# Patient Record
Sex: Male | Born: 1985 | Race: Black or African American | Smoking: Current every day smoker
Health system: Northeastern US, Academic
[De-identification: ages and names within clinical notes are randomized; demographics above are authoritative.]

## PROBLEM LIST (undated history)

## (undated) DIAGNOSIS — R7303 Prediabetes: Secondary | ICD-10-CM

## (undated) DIAGNOSIS — F32A Depression, unspecified: Secondary | ICD-10-CM

## (undated) DIAGNOSIS — J45909 Unspecified asthma, uncomplicated: Secondary | ICD-10-CM

## (undated) DIAGNOSIS — T7840XA Allergy, unspecified, initial encounter: Secondary | ICD-10-CM

## (undated) DIAGNOSIS — F419 Anxiety disorder, unspecified: Secondary | ICD-10-CM

## (undated) HISTORY — DX: Depression, unspecified: F32.A

## (undated) HISTORY — DX: Prediabetes: R73.03

## (undated) HISTORY — DX: Allergy, unspecified, initial encounter: T78.40XA

## (undated) HISTORY — DX: Unspecified asthma, uncomplicated: J45.909

## (undated) HISTORY — DX: Anxiety disorder, unspecified: F41.9

---
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
## 2005-07-28 DIAGNOSIS — J45909 Unspecified asthma, uncomplicated: Secondary | ICD-10-CM | POA: Insufficient documentation

## 2015-09-30 ENCOUNTER — Emergency Department (HOSPITAL_COMMUNITY)
Admission: EM | Admit: 2015-09-30 | Discharge: 2015-09-30 | Disposition: A | Discharge status: Home / Self Care | Payer: Self-pay | Attending: Emergency Medicine | Admitting: Emergency Medicine

## 2015-09-30 ENCOUNTER — Encounter (HOSPITAL_COMMUNITY): Payer: Self-pay | Admitting: Nurse Practitioner

## 2015-09-30 DIAGNOSIS — K0889 Other specified disorders of teeth and supporting structures: Secondary | ICD-10-CM | POA: Insufficient documentation

## 2015-09-30 DIAGNOSIS — F17210 Nicotine dependence, cigarettes, uncomplicated: Secondary | ICD-10-CM | POA: Insufficient documentation

## 2015-09-30 MED ORDER — PENICILLIN V POTASSIUM 500 MG PO TABS: 500.0000 mg | ORAL_TABLET | Freq: Four times a day (QID) | ORAL | Status: DC

## 2015-09-30 NOTE — Discharge Instructions (Signed)

## 2015-09-30 NOTE — ED Provider Notes (Signed)
CSN: [id number]     Arrival date & time 09/30/15  1839 History  By signing my name below, I, Soijett Blue, attest that this documentation has been prepared under the direction and in the presence of Roxy Horseman, PA-C Electronically Signed: Soijett Blue, ED Scribe. 09/30/2015. 7:04 PM.   Chief Complaint  Patient presents with  . Dental Pain      The history is provided by the patient. No language interpreter was used.    Corey Alexander is a 29 y.o. male who presents to the Emergency Department complaining of moderate left upper dental pain onset yesterday. He notes that he hasn't been to see a dentist due to him not having insurance to see one. He denies broken teeth or any injuries/trauma to his mouth. He states that he is having associated symptoms of facial swelling x last night. He states that has tried ibuprofen with mild relief for his symptoms. He denies gum swelling/bleeding, sore throat, fever, chills, and any other symptoms. Pt denies allergies to any medications.   History reviewed. No pertinent past medical history. History reviewed. No pertinent past surgical history. History reviewed. No pertinent family history. Social History  Substance Use Topics  . Smoking status: Current Every Day Smoker    Types: Cigarettes  . Smokeless tobacco: None  . Alcohol Use: Yes    Review of Systems  Constitutional: Negative for fever.  HENT: Positive for dental problem and facial swelling. Negative for sore throat and trouble swallowing.   Skin: Negative for color change and wound.      Allergies  Review of patient's allergies indicates no known allergies.  Home Medications   Prior to Admission medications   Not on File   BP 125/82 mmHg  Pulse 98  Temp(Src) 98.2 F (36.8 C) (Oral)  Resp 17  SpO2 98% Physical Exam  Constitutional: He is oriented to person, place, and time. He appears well-developed and well-nourished. No distress.  HENT:  Head: Normocephalic and  atraumatic.  Mouth/Throat:    Poor dentition throughout.  Affected tooth as diagrammed.  No signs of peritonsillar or tonsillar abscess.  No signs of gingival abscess. Oropharynx is clear and without exudates.  Uvula is midline.  Airway is intact. No signs of Ludwig's angina with palpation of oral and sublingual mucosa.   Eyes: EOM are normal.  Neck: Neck supple.  Cardiovascular: Normal rate.   Pulmonary/Chest: Effort normal. No respiratory distress.  Musculoskeletal: Normal range of motion.  Neurological: He is alert and oriented to person, place, and time.  Skin: Skin is warm and dry.  Psychiatric: He has a normal mood and affect. His behavior is normal.  Nursing note and vitals reviewed.   ED Course  Procedures (including critical care time) DIAGNOSTIC STUDIES: Oxygen Saturation is 98% on RA, nl by my interpretation.    COORDINATION OF CARE: 7:04 PM Discussed treatment plan with pt at bedside which includes abx Rx, referral and f/u with dentist and pt agreed to plan.    MDM   Final diagnoses:  Pain, dental    Patient with toothache.  No gross abscess.  Exam unconcerning for Ludwig's angina or spread of infection.  Will treat with penicillin and OTC pain medicine.  Urged patient to follow-up with dentist.     I personally performed the services described in this documentation, which was scribed in my presence. The recorded information has been reviewed and is accurate.      Roxy Horseman, PA-C 09/30/15 1911  Mancel Bale,  MD 09/30/15 2338

## 2015-09-30 NOTE — ED Notes (Signed)
He c/o L sided dental pain onset yesterday, today began to have facial swelling. He tried ibuprofen with no relief. He does not have insurance to see a Education officer, communitydentist

## 2017-04-23 ENCOUNTER — Emergency Department (HOSPITAL_COMMUNITY)
Admission: EM | Admit: 2017-04-23 | Discharge: 2017-04-23 | Disposition: A | Discharge status: Home / Self Care | Payer: Self-pay | Attending: Emergency Medicine | Admitting: Emergency Medicine

## 2017-04-23 ENCOUNTER — Encounter (HOSPITAL_COMMUNITY): Payer: Self-pay | Admitting: Emergency Medicine

## 2017-04-23 DIAGNOSIS — Z79899 Other long term (current) drug therapy: Secondary | ICD-10-CM | POA: Insufficient documentation

## 2017-04-23 DIAGNOSIS — J45909 Unspecified asthma, uncomplicated: Secondary | ICD-10-CM | POA: Insufficient documentation

## 2017-04-23 DIAGNOSIS — J029 Acute pharyngitis, unspecified: Secondary | ICD-10-CM | POA: Insufficient documentation

## 2017-04-23 DIAGNOSIS — F17210 Nicotine dependence, cigarettes, uncomplicated: Secondary | ICD-10-CM | POA: Insufficient documentation

## 2017-04-23 HISTORY — DX: Unspecified asthma, uncomplicated: J45.909

## 2017-04-23 LAB — RAPID STREP SCREEN (MED CTR MEBANE ONLY): STREPTOCOCCUS, GROUP A SCREEN (DIRECT): NEGATIVE

## 2017-04-23 MED ORDER — IBUPROFEN 200 MG PO TABS
600.0000 mg | ORAL_TABLET | Freq: Once | ORAL | Status: AC
  Administered 2017-04-23: 600 mg via ORAL
  Filled 2017-04-23: qty 3

## 2017-04-23 MED ORDER — HYDROCODONE-ACETAMINOPHEN 7.5-325 MG/15ML PO SOLN: 15.0000 mL | Freq: Three times a day (TID) | ORAL | 0 refills | Status: DC | PRN

## 2017-04-23 MED ORDER — PENICILLIN G BENZATHINE 1200000 UNIT/2ML IM SUSP
1.2000 10*6.[IU] | Freq: Once | INTRAMUSCULAR | Status: AC
  Administered 2017-04-23: 1.2 10*6.[IU] via INTRAMUSCULAR
  Filled 2017-04-23: qty 2

## 2017-04-23 MED ORDER — DEXAMETHASONE SODIUM PHOSPHATE 10 MG/ML IJ SOLN
10.0000 mg | Freq: Once | INTRAMUSCULAR | Status: AC
  Administered 2017-04-23: 10 mg via INTRAMUSCULAR
  Filled 2017-04-23: qty 1

## 2017-04-23 NOTE — Discharge Instructions (Signed)
Take your medication as prescribed as needed for pain relief. I also recommend continuing to take Tylenol and ibuprofen as prescribed over-the-counter for additional relief. Continue drinking fluids at home to remain hydrated. You will remain contagious for the first 24 hours after receiving your dose of antibiotics in the ED. Follow-up with a primary care provider from the clinic listed below in the next 3-4 days if your symptoms have not improved. Please return to the Emergency Department if symptoms worsen or new onset of fever, facial swelling, unable to open her jaw completely, drooling due to being unable to swallow, difficulty breathing, vomiting.

## 2017-04-23 NOTE — ED Triage Notes (Signed)
Patient c/o sore throat, painful to swallow for past few days. Pt states waking up this morning with more swelling noted to throat.

## 2017-04-23 NOTE — ED Provider Notes (Signed)
[facility]-EMERGENCY DEPT Provider Note   CSN: [id number] Arrival date & time: 04/23/17  1732  By signing my name below, I, Diona Browner, attest that this documentation has been prepared under the direction and in the presence of Melburn Hake, PA-C. Electronically Signed: Diona Browner, ED Scribe. 04/23/17. 6:52 PM.  History   Chief Complaint Chief Complaint  Patient presents with  . Sore Throat    HPI Corey Alexander is a 31 y.o. male with a PMHx of asthma who presents to the Emergency Department complaining of a gradually worsening, sore throat for the last couple of days. Pt notes increased swelling to his throat this morning. Associated sx include neck swelling, rhinorrhea, and hurts to swallow. He drank hot tea with no relief. He hasn't taken any medication for relief. No one around him has been sick. Pt denies fever, cough, facial swelling, trismus, drooling, voice change, SOB or vomiting.  The history is provided by the patient. No language interpreter was used.    Past Medical History:  Diagnosis Date  . Asthma     There are no active problems to display for this patient.   History reviewed. No pertinent surgical history.     Home Medications    Prior to Admission medications   Medication Sig Start Date End Date Taking? Authorizing Provider  HYDROcodone-acetaminophen (HYCET) 7.5-325 mg/15 ml solution Take 15 mLs by mouth every 8 (eight) hours as needed for moderate pain. 04/23/17   Barrett Henle, PA-C  penicillin v potassium (VEETID) 500 MG tablet Take 1 tablet (500 mg total) by mouth 4 (four) times daily. 09/30/15   Roxy Horseman, PA-C    Family History No family history on file.  Social History Social History  Substance Use Topics  . Smoking status: Current Every Day Smoker    Types: Cigarettes  . Smokeless tobacco: Not on file  . Alcohol use Yes     Allergies   Patient has no known allergies.   Review of Systems Review of Systems    Constitutional: Negative for fever.  HENT: Positive for rhinorrhea, sore throat and trouble swallowing.   Respiratory: Negative for cough.   Gastrointestinal: Negative for vomiting.     Physical Exam Updated Vital Signs BP (!) 132/96 (BP Location: Right Arm)   Pulse 72   Temp 98.3 F (36.8 C) (Oral)   Resp 16   Ht 5\' 11"  (1.803 m)   Wt 88.5 kg (195 lb)   SpO2 100%   BMI 27.20 kg/m   Physical Exam  Constitutional: He is oriented to person, place, and time. He appears well-developed and well-nourished.  HENT:  Head: Normocephalic and atraumatic.  Mouth/Throat: Uvula is midline, oropharynx is clear and moist and mucous membranes are normal. No oropharyngeal exudate, posterior oropharyngeal edema, posterior oropharyngeal erythema or tonsillar abscesses. Tonsils are 3+ on the right. Tonsils are 3+ on the left. Tonsillar exudate.  Moderate swelling and erythema present to bilateral tonsils and posterior oral pharynx. Small amount of exudate to bilateral tonsils. Uvula midline. No muffled voice. No trismus or drooling. No facial or neck swelling.   Eyes: Conjunctivae and EOM are normal. Right eye exhibits no discharge. Left eye exhibits no discharge. No scleral icterus.  Neck: Normal range of motion. Neck supple.  Cardiovascular: Normal rate, regular rhythm, normal heart sounds and intact distal pulses.   Pulmonary/Chest: Effort normal and breath sounds normal. No respiratory distress. He has no wheezes. He has no rales. He exhibits no tenderness.  Abdominal: Soft. He  exhibits no distension.  Musculoskeletal: Normal range of motion. He exhibits no edema.  Lymphadenopathy:    He has cervical adenopathy (bilateral submandibular ).  Neurological: He is alert and oriented to person, place, and time.  Skin: Skin is warm and dry.  Nursing note and vitals reviewed.    ED Treatments / Results  DIAGNOSTIC STUDIES: Oxygen Saturation is 100% on RA, normal by my interpretation.    COORDINATION OF CARE: 5:58 PM-Discussed next steps with pt. Pt verbalized understanding and is agreeable with the plan.   Labs (all labs ordered are listed, but only abnormal results are displayed) Labs Reviewed  RAPID STREP SCREEN (NOT [facility])  CULTURE, GROUP A STREP [facility])    EKG  EKG Interpretation None       Radiology No results found.  Procedures Procedures (including critical care time)  Medications Ordered in ED Medications  penicillin g benzathine (BICILLIN LA) 1200000 UNIT/2ML injection 1.2 Million Units (not administered)  dexamethasone (DECADRON) injection 10 mg (10 mg Intramuscular Given 04/23/17 1813)  ibuprofen (ADVIL,MOTRIN) tablet 600 mg (600 mg Oral Given 04/23/17 1813)     Initial Impression / Assessment and Plan / ED Course  I have reviewed the triage vital signs and the nursing notes.  Pertinent labs & imaging results that were available during my care of the patient were reviewed by me and considered in my medical decision making (see chart for details).     Patient presents with sore throat that started this morning and has worsened throughout the day. Denies associated symptoms or any known sick contacts. VSS. Exam revealed moderate swelling and erythema to bilateral tonsils and posterior oropharynx. No trismus or drooling. No facial swelling. Uvula midline. Patient tolerating secretions. Remaining exam unremarkable. No evidence suggesting peritonsillar abscess at this time. Centor score 3. Patient given IM steroids and by mouth ibuprofen in the ED. Tolerating PO. Rapid strep negative, however due to pt sxs and exam concerning for strep pharyngitis, plan to treat patient with single IM injection of penicillin. Discussed results and plan for discharge with patient. Advised patient to follow up with PCP as needed. Discussed return precautions.  Final Clinical Impressions(s) / ED Diagnoses   Final diagnoses:  Pharyngitis, unspecified etiology     New Prescriptions New Prescriptions   HYDROCODONE-ACETAMINOPHEN (HYCET) 7.5-325 MG/15 ML SOLUTION    Take 15 mLs by mouth every 8 (eight) hours as needed for moderate pain.   I personally performed the services described in this documentation, which was scribed in my presence. The recorded information has been reviewed and is accurate.     Barrett Henleadeau, Shmuel Girgis Elizabeth, PA-C 04/23/17 Herbie [place]1855    Mancel BaleWentz, Elliott, MD 04/23/17 [phone]

## 2017-04-26 LAB — CULTURE, GROUP A STREP (THRC)

## 2017-11-28 ENCOUNTER — Emergency Department (HOSPITAL_COMMUNITY)
Admission: EM | Admit: 2017-11-28 | Discharge: 2017-11-28 | Disposition: A | Discharge status: Home / Self Care | Payer: PRIVATE HEALTH INSURANCE | Attending: Emergency Medicine | Admitting: Emergency Medicine

## 2017-11-28 ENCOUNTER — Other Ambulatory Visit: Payer: Self-pay

## 2017-11-28 ENCOUNTER — Encounter (HOSPITAL_COMMUNITY): Payer: Self-pay

## 2017-11-28 DIAGNOSIS — K0889 Other specified disorders of teeth and supporting structures: Secondary | ICD-10-CM | POA: Diagnosis present

## 2017-11-28 DIAGNOSIS — K047 Periapical abscess without sinus: Secondary | ICD-10-CM

## 2017-11-28 DIAGNOSIS — F17210 Nicotine dependence, cigarettes, uncomplicated: Secondary | ICD-10-CM | POA: Diagnosis not present

## 2017-11-28 DIAGNOSIS — Z79899 Other long term (current) drug therapy: Secondary | ICD-10-CM | POA: Insufficient documentation

## 2017-11-28 DIAGNOSIS — J45909 Unspecified asthma, uncomplicated: Secondary | ICD-10-CM | POA: Insufficient documentation

## 2017-11-28 DIAGNOSIS — K029 Dental caries, unspecified: Secondary | ICD-10-CM | POA: Diagnosis not present

## 2017-11-28 MED ORDER — NAPROXEN 500 MG PO TABS: 500.0000 mg | ORAL_TABLET | Freq: Two times a day (BID) | ORAL | 0 refills | Status: DC

## 2017-11-28 MED ORDER — PENICILLIN V POTASSIUM 500 MG PO TABS: 500.0000 mg | ORAL_TABLET | Freq: Four times a day (QID) | ORAL | 0 refills | Status: AC

## 2017-11-28 NOTE — ED Provider Notes (Signed)
[name] Provider Note   CSN: [id number] Arrival date & time: 11/28/17  1458     History   Chief Complaint Chief Complaint  Patient presents with  . Dental Pain    HPI Corey Alexander is a 32 y.o. male who presents to the ED with dental pain. The pain started last night after a filing fell out of the left upper dental area. Pain increased today with swelling of the left side of the face. Patient has taken nothing for pain.   HPI  Past Medical History:  Diagnosis Date  . Asthma     There are no active problems to display for this patient.   History reviewed. No pertinent surgical history.     Home Medications    Prior to Admission medications   Medication Sig Start Date End Date Taking? Authorizing Provider  HYDROcodone-acetaminophen (HYCET) 7.5-325 mg/15 ml solution Take 15 mLs by mouth every 8 (eight) hours as needed for moderate pain. 04/23/17   Barrett HenleNadeau, Nicole Elizabeth, PA-C  naproxen (NAPROSYN) 500 MG tablet Take 1 tablet (500 mg total) by mouth 2 (two) times daily. 11/28/17   Janne NapoleonNeese, Hope M, NP  penicillin v potassium (VEETID) 500 MG tablet Take 1 tablet (500 mg total) by mouth 4 (four) times daily for 7 days. 11/28/17 12/05/17  Janne NapoleonNeese, Hope M, NP    Family History History reviewed. No pertinent family history.  Social History Social History   Tobacco Use  . Smoking status: Current Every Day Smoker    Types: Cigarettes  . Smokeless tobacco: Never Used  Substance Use Topics  . Alcohol use: Yes    Comment: social  . Drug use: No     Allergies   Patient has no known allergies.   Review of Systems Review of Systems  HENT: Positive for dental problem and facial swelling.   All other systems reviewed and are negative.    Physical Exam Updated Vital Signs BP 133/84   Pulse 97   Temp 98.2 F (36.8 C)   Resp 16   SpO2 100%   Physical Exam  Constitutional: He appears well-developed and well-nourished.  No distress.  HENT:  Mouth/Throat: Uvula is midline.    The filing is out of the left upper first molar. There is swelling and tenderness to the gum surrounding the tooth.   Eyes: EOM are normal.  Neck: Normal range of motion. Neck supple.  Cardiovascular: Normal rate and regular rhythm.  Pulmonary/Chest: Effort normal and breath sounds normal.  Musculoskeletal: Normal range of motion.  Lymphadenopathy:    He has no cervical adenopathy.  Neurological: He is alert.  Skin: Skin is warm and dry.  Psychiatric: He has a normal mood and affect.  Nursing note and vitals reviewed.    ED Treatments / Results  Labs (all labs ordered are listed, but only abnormal results are displayed) Labs Reviewed - No data to display  Radiology No results found.  Procedures Procedures (including critical care time)  Medications Ordered in ED Medications - No data to display   Initial Impression / Assessment and Plan / ED Course  I have reviewed the triage vital signs and nurses notes.  Patient with toothache.  No gross abscess.  Exam unconcerning for Ludwig's angina or spread of infection.  Will treat with penicillin and anti-inflammatories medicine.  Urged patient to follow-up with dentist.    Final Clinical Impressions(s) / ED Diagnoses   Final diagnoses:  Infected dental caries  ED Discharge Orders        Ordered    penicillin v potassium (VEETID) 500 MG tablet  4 times daily     11/28/17 1626    naproxen (NAPROSYN) 500 MG tablet  2 times daily     11/28/17 [address] [place], [place] 11/28/17 1631    Bethann Berkshire, MD 11/28/17 2342

## 2017-11-28 NOTE — ED Triage Notes (Signed)
Pt states he has two cracked teeth after a filling came out. He reports he has trouble eating due to pain. No facial swelling noted.

## 2018-01-27 ENCOUNTER — Emergency Department (HOSPITAL_COMMUNITY): Payer: No Typology Code available for payment source

## 2018-01-27 ENCOUNTER — Emergency Department (HOSPITAL_COMMUNITY)
Admission: EM | Admit: 2018-01-27 | Discharge: 2018-01-27 | Disposition: A | Discharge status: Home / Self Care | Payer: No Typology Code available for payment source | Attending: Emergency Medicine | Admitting: Emergency Medicine

## 2018-01-27 ENCOUNTER — Encounter (HOSPITAL_COMMUNITY): Payer: Self-pay | Admitting: Emergency Medicine

## 2018-01-27 DIAGNOSIS — S63693A Other sprain of left middle finger, initial encounter: Secondary | ICD-10-CM | POA: Diagnosis not present

## 2018-01-27 DIAGNOSIS — Y999 Unspecified external cause status: Secondary | ICD-10-CM | POA: Diagnosis not present

## 2018-01-27 DIAGNOSIS — S299XXA Unspecified injury of thorax, initial encounter: Secondary | ICD-10-CM | POA: Diagnosis present

## 2018-01-27 DIAGNOSIS — F17210 Nicotine dependence, cigarettes, uncomplicated: Secondary | ICD-10-CM | POA: Diagnosis not present

## 2018-01-27 DIAGNOSIS — S20211A Contusion of right front wall of thorax, initial encounter: Secondary | ICD-10-CM | POA: Diagnosis not present

## 2018-01-27 DIAGNOSIS — Y929 Unspecified place or not applicable: Secondary | ICD-10-CM | POA: Diagnosis not present

## 2018-01-27 DIAGNOSIS — J45909 Unspecified asthma, uncomplicated: Secondary | ICD-10-CM | POA: Diagnosis not present

## 2018-01-27 DIAGNOSIS — S8002XA Contusion of left knee, initial encounter: Secondary | ICD-10-CM | POA: Insufficient documentation

## 2018-01-27 DIAGNOSIS — Y939 Activity, unspecified: Secondary | ICD-10-CM | POA: Insufficient documentation

## 2018-01-27 MED ORDER — CYCLOBENZAPRINE HCL 10 MG PO TABS: 10.0000 mg | ORAL_TABLET | Freq: Two times a day (BID) | ORAL | 0 refills | Status: DC | PRN

## 2018-01-27 MED ORDER — DICLOFENAC SODIUM 50 MG PO TBEC: 50.0000 mg | DELAYED_RELEASE_TABLET | Freq: Two times a day (BID) | ORAL | 0 refills | Status: DC
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
## 2018-09-16 ENCOUNTER — Encounter (HOSPITAL_COMMUNITY): Payer: Self-pay | Admitting: Emergency Medicine

## 2018-09-16 ENCOUNTER — Emergency Department (HOSPITAL_COMMUNITY)
Admission: EM | Admit: 2018-09-16 | Discharge: 2018-09-16 | Disposition: A | Discharge status: Home / Self Care | Payer: Self-pay | Attending: Emergency Medicine | Admitting: Emergency Medicine

## 2018-09-16 ENCOUNTER — Emergency Department (HOSPITAL_COMMUNITY): Payer: Self-pay

## 2018-09-16 DIAGNOSIS — J45909 Unspecified asthma, uncomplicated: Secondary | ICD-10-CM | POA: Insufficient documentation

## 2018-09-16 DIAGNOSIS — N342 Other urethritis: Secondary | ICD-10-CM | POA: Insufficient documentation

## 2018-09-16 DIAGNOSIS — R109 Unspecified abdominal pain: Secondary | ICD-10-CM | POA: Insufficient documentation

## 2018-09-16 DIAGNOSIS — F17210 Nicotine dependence, cigarettes, uncomplicated: Secondary | ICD-10-CM | POA: Insufficient documentation

## 2018-09-16 LAB — CBC WITH DIFFERENTIAL/PLATELET
ABS IMMATURE GRANULOCYTES: 0.03 10*3/uL (ref 0.00–0.07)
BASOS PCT: 1 %
Basophils Absolute: 0.1 10*3/uL (ref 0.0–0.1)
Eosinophils Absolute: 0.4 10*3/uL (ref 0.0–0.5)
Eosinophils Relative: 3 %
HEMATOCRIT: 44.7 % (ref 39.0–52.0)
HEMOGLOBIN: 14.3 g/dL (ref 13.0–17.0)
Immature Granulocytes: 0 %
LYMPHS ABS: 3.4 10*3/uL (ref 0.7–4.0)
Lymphocytes Relative: 29 %
MCH: 25.9 pg — ABNORMAL LOW (ref 26.0–34.0)
MCHC: 32 g/dL (ref 30.0–36.0)
MCV: 81 fL (ref 80.0–100.0)
MONO ABS: 0.9 10*3/uL (ref 0.1–1.0)
MONOS PCT: 8 %
NEUTROS ABS: 6.7 10*3/uL (ref 1.7–7.7)
Neutrophils Relative %: 59 %
PLATELETS: 330 10*3/uL (ref 150–400)
RBC: 5.52 MIL/uL (ref 4.22–5.81)
RDW: 13.7 % (ref 11.5–15.5)
WBC: 11.5 10*3/uL — ABNORMAL HIGH (ref 4.0–10.5)
nRBC: 0 % (ref 0.0–0.2)

## 2018-09-16 LAB — URINALYSIS, ROUTINE W REFLEX MICROSCOPIC
Bilirubin Urine: NEGATIVE
GLUCOSE, UA: NEGATIVE mg/dL
HGB URINE DIPSTICK: NEGATIVE
Ketones, ur: 20 mg/dL — AB
NITRITE: NEGATIVE
PH: 5 (ref 5.0–8.0)
PROTEIN: 30 mg/dL — AB
Specific Gravity, Urine: 1.031 — ABNORMAL HIGH (ref 1.005–1.030)

## 2018-09-16 LAB — BASIC METABOLIC PANEL
Anion gap: 11 (ref 5–15)
BUN: 13 mg/dL (ref 6–20)
CO2: 26 mmol/L (ref 22–32)
Calcium: 9.8 mg/dL (ref 8.9–10.3)
Chloride: 103 mmol/L (ref 98–111)
Creatinine, Ser: 1.12 mg/dL (ref 0.61–1.24)
GFR calc Af Amer: >60 mL/min (ref >60)
GLUCOSE: 75 mg/dL (ref 70–99)
Potassium: 3.9 mmol/L (ref 3.5–5.1)
Sodium: 140 mmol/L (ref 135–145)

## 2018-09-16 MED ORDER — NAPROXEN 500 MG PO TABS: 500.0000 mg | ORAL_TABLET | Freq: Two times a day (BID) | ORAL | 0 refills | Status: DC

## 2018-09-16 MED ORDER — DOXYCYCLINE HYCLATE 100 MG PO CAPS: 100.0000 mg | ORAL_CAPSULE | Freq: Two times a day (BID) | ORAL | 0 refills | Status: DC

## 2018-09-16 NOTE — ED Provider Notes (Signed)
[name] EMERGENCY DEPARTMENT Provider Note   CSN: [id number] Arrival date & time: 09/16/18  1407     History   Chief Complaint Chief Complaint  Patient presents with  . Flank Pain    HPI JONAVAN VANHORN is a 32 y.o. male.  32 year old male presents with complaint of left flank pain since yesterday.  Denies changes in urinary or bowel habits, nausea, vomiting, abdominal pain.  Nothing makes his pain better or worse.  No history of prior kidney stones.  No history of similar pain previously, denies falls or injuries no other complaints or concerns     Past Medical History:  Diagnosis Date  . Asthma     There are no active problems to display for this patient.   History reviewed. No pertinent surgical history.      Home Medications    Prior to Admission medications   Medication Sig Start Date End Date Taking? Authorizing Provider  doxycycline (VIBRAMYCIN) 100 MG capsule Take 1 capsule (100 mg total) by mouth 2 (two) times daily. 09/16/18   Jeannie Fend, PA-C  naproxen (NAPROSYN) 500 MG tablet Take 1 tablet (500 mg total) by mouth 2 (two) times daily. 09/16/18   Jeannie Fend, PA-C    Family History No family history on file.  Social History Social History   Tobacco Use  . Smoking status: Current Every Day Smoker    Types: Cigarettes  . Smokeless tobacco: Never Used  Substance Use Topics  . Alcohol use: Yes    Comment: social  . Drug use: No     Allergies   Patient has no known allergies.   Review of Systems Review of Systems  Constitutional: Negative for chills and fever.  Gastrointestinal: Negative for abdominal distention, abdominal pain, constipation, diarrhea, nausea and vomiting.  Genitourinary: Positive for flank pain. Negative for decreased urine volume, difficulty urinating, discharge, dysuria, frequency, hematuria, penile pain, penile swelling, scrotal swelling and testicular pain.  Musculoskeletal: Positive for  back pain. Negative for arthralgias, gait problem, joint swelling and myalgias.  Skin: Negative for rash and wound.  Allergic/Immunologic: Negative for immunocompromised state.  Neurological: Negative for weakness.  Hematological: Does not bruise/bleed easily.  Psychiatric/Behavioral: Negative for confusion.  All other systems reviewed and are negative.    Physical Exam Updated Vital Signs BP (!) 129/94   Pulse (!) 53   Temp 98.1 F (36.7 C) (Oral)   Resp 16   SpO2 98%   Physical Exam  Constitutional: He is oriented to person, place, and time. He appears well-developed and well-nourished. No distress.  HENT:  Head: Normocephalic and atraumatic.  Cardiovascular: Normal rate, regular rhythm, normal heart sounds and intact distal pulses.  No murmur heard. Pulmonary/Chest: Effort normal and breath sounds normal. No respiratory distress.  Abdominal: Soft. Bowel sounds are normal. He exhibits no distension and no mass. There is tenderness in the left lower quadrant. There is no guarding and no CVA tenderness.  Musculoskeletal: Normal range of motion. He exhibits tenderness. He exhibits no deformity.       Back:  Neurological: He is alert and oriented to person, place, and time. He has normal strength. No sensory deficit.  Skin: Skin is warm and dry. No rash noted. He is not diaphoretic. No erythema.  Psychiatric: He has a normal mood and affect. His behavior is normal.  Nursing note and vitals reviewed.    ED Treatments / Results  Labs (all labs ordered are listed, but only abnormal results  are displayed) Labs Reviewed  URINALYSIS, ROUTINE W REFLEX MICROSCOPIC - Abnormal; Notable for the following components:      Result Value   Specific Gravity, Urine 1.031 (*)    Ketones, ur 20 (*)    Protein, ur 30 (*)    Leukocytes, UA SMALL (*)    Bacteria, UA RARE (*)    All other components within normal limits  CBC WITH DIFFERENTIAL/PLATELET - Abnormal; Notable for the following  components:   WBC 11.5 (*)    MCH 25.9 (*)    All other components within normal limits  BASIC METABOLIC PANEL  GC/CHLAMYDIA PROBE AMP ([place]) NOT AT [facility]    EKG None  Radiology Ct Renal Stone Study  Result Date: 09/16/2018 CLINICAL DATA:  Left flank pain since this morning. EXAM: CT ABDOMEN AND PELVIS WITHOUT CONTRAST TECHNIQUE: Multidetector CT imaging of the abdomen and pelvis was performed following the standard protocol without IV contrast. COMPARISON:  None. FINDINGS: Lower chest: No acute abnormality. Hepatobiliary: No focal liver abnormality is seen. No gallstones, gallbladder wall thickening, or biliary dilatation. Pancreas: Unremarkable. No pancreatic ductal dilatation or surrounding inflammatory changes. Spleen: Normal in size without focal abnormality. Adrenals/Urinary Tract: Adrenal glands are unremarkable. Kidneys are normal, without renal calculi, focal lesion, or hydronephrosis. Bladder is unremarkable. Stomach/Bowel: Stomach is within normal limits. Appendix appears normal. No evidence of bowel wall thickening, distention, or inflammatory changes. Vascular/Lymphatic: No significant vascular findings are present. No enlarged abdominal or pelvic lymph nodes. Reproductive: Prostate is unremarkable. Other: No abdominal wall hernia or abnormality. No abdominopelvic ascites. Musculoskeletal: No acute or significant osseous findings. IMPRESSION: No acute abnormality identified in the abdomen and pelvis. No nephrolithiasis or hydroureteronephrosis is identified bilaterally. Electronically Signed   By: Sherian Rein M.D.   On: 09/16/2018 18:58    Procedures Procedures (including critical care time)  Medications Ordered in ED Medications - No data to display   Initial Impression / Assessment and Plan / ED Course  I have reviewed the triage vital signs and the nursing notes.  Pertinent labs & imaging results that were available during my care of the patient were reviewed by me  and considered in my medical decision making (see chart for details).  Clinical Course as of Sep 17 2015  Tue Sep 16, 2018  [id number] 32 year old male with complaint of left flank pain.  No history of kidney stone, denies urinary symptoms.  Urinalysis with ketones, protein, small leukocytes, white cells.  Patient denies high risk of gonorrhea and chlamydia however states this is possible.  Gonorrhea chlamydia testing sent out, given prescription for doxycycline for urethritis in the meantime.  BMP within normal limits, CBC with slight leukocytosis at 11.5.  CT abdomen pelvis without contrast is unremarkable.  Recommend patient call the hospital for his test results in 3 days, follow-up with PCP, return to ER for worsening or concerning symptoms.   [LM]    Clinical Course User Index [LM] Jeannie Fend, PA-C   Final Clinical Impressions(s) / ED Diagnoses   Final diagnoses:  Left flank pain  Infective urethritis    ED Discharge Orders         Ordered    doxycycline (VIBRAMYCIN) 100 MG capsule  2 times daily     09/16/18 2002    naproxen (NAPROSYN) 500 MG tablet  2 times daily     09/16/18 2002           Jeannie Fend, PA-C 09/16/18 2016    Nanavati, Ankit,  MD 09/17/18 1911

## 2018-09-16 NOTE — ED Triage Notes (Signed)
Pt arrives to ED with c/o of left flank pain. No radiation., no urinary symptoms.

## 2018-09-16 NOTE — ED Notes (Signed)
Patient transported to CT 

## 2018-09-16 NOTE — Discharge Instructions (Addendum)
Take Doxycyline as prescribed and complete the full course. Take Naproxen as prescribed as needed for flank pain. Call the hospital for your remaining test results in 3 days.

## 2018-09-17 LAB — GC/CHLAMYDIA PROBE AMP (~~LOC~~) NOT AT ARMC
CHLAMYDIA, DNA PROBE: POSITIVE — AB
NEISSERIA GONORRHEA: NEGATIVE

## 2018-12-15 ENCOUNTER — Encounter (HOSPITAL_COMMUNITY): Payer: Self-pay | Admitting: Emergency Medicine

## 2018-12-15 ENCOUNTER — Emergency Department (HOSPITAL_COMMUNITY): Payer: Self-pay

## 2018-12-15 ENCOUNTER — Emergency Department (HOSPITAL_COMMUNITY)
Admission: EM | Admit: 2018-12-15 | Discharge: 2018-12-15 | Disposition: A | Discharge status: Home / Self Care | Payer: Self-pay | Attending: Emergency Medicine | Admitting: Emergency Medicine

## 2018-12-15 ENCOUNTER — Other Ambulatory Visit: Payer: Self-pay

## 2018-12-15 DIAGNOSIS — M542 Cervicalgia: Secondary | ICD-10-CM | POA: Insufficient documentation

## 2018-12-15 DIAGNOSIS — J45909 Unspecified asthma, uncomplicated: Secondary | ICD-10-CM | POA: Insufficient documentation

## 2018-12-15 DIAGNOSIS — F17210 Nicotine dependence, cigarettes, uncomplicated: Secondary | ICD-10-CM | POA: Insufficient documentation

## 2018-12-15 DIAGNOSIS — J029 Acute pharyngitis, unspecified: Secondary | ICD-10-CM | POA: Insufficient documentation

## 2018-12-15 MED ORDER — LIDOCAINE VISCOUS HCL 2 % MT SOLN
15.0000 mL | Freq: Once | OROMUCOSAL | Status: AC
  Administered 2018-12-15: 15 mL via OROMUCOSAL
  Filled 2018-12-15: qty 15

## 2018-12-15 MED ORDER — IBUPROFEN 200 MG PO TABS
600.0000 mg | ORAL_TABLET | Freq: Once | ORAL | Status: AC
  Administered 2018-12-15: 600 mg via ORAL
  Filled 2018-12-15: qty 1
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
## 2018-12-15 NOTE — Discharge Instructions (Signed)
Your evaluation today is reassuring please continue using ibuprofen and Tylenol up to every 6 hours you can also use topical throat lozenges or Chloraseptic throat spray.  If you start having worsening pain in your throat or neck, difficulty breathing or swallowing, fevers or any other new or concerning symptoms return to the emergency department for reevaluation.
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
## 2018-12-15 NOTE — ED Notes (Signed)
Patient verbalizes understanding of discharge instructions. Opportunity for questioning and answers were provided. Armband removed by staff, pt discharged from ED ambulatory.   

## 2018-12-15 NOTE — ED Notes (Signed)
ED Provider at bedside. 

## 2018-12-21 ENCOUNTER — Emergency Department (HOSPITAL_COMMUNITY)
Admission: EM | Admit: 2018-12-21 | Discharge: 2018-12-21 | Disposition: A | Discharge status: Home / Self Care | Payer: Self-pay | Attending: Emergency Medicine | Admitting: Emergency Medicine

## 2018-12-21 ENCOUNTER — Other Ambulatory Visit: Payer: Self-pay

## 2018-12-21 ENCOUNTER — Encounter (HOSPITAL_COMMUNITY): Payer: Self-pay

## 2018-12-21 DIAGNOSIS — F17210 Nicotine dependence, cigarettes, uncomplicated: Secondary | ICD-10-CM | POA: Insufficient documentation

## 2018-12-21 DIAGNOSIS — J45909 Unspecified asthma, uncomplicated: Secondary | ICD-10-CM | POA: Insufficient documentation

## 2018-12-21 DIAGNOSIS — K047 Periapical abscess without sinus: Secondary | ICD-10-CM | POA: Insufficient documentation

## 2018-12-21 MED ORDER — PENICILLIN V POTASSIUM 500 MG PO TABS: 500.0000 mg | ORAL_TABLET | Freq: Four times a day (QID) | ORAL | 0 refills | Status: AC

## 2018-12-21 MED ORDER — IBUPROFEN 800 MG PO TABS: 800.0000 mg | ORAL_TABLET | Freq: Three times a day (TID) | ORAL | 0 refills | Status: DC | PRN

## 2018-12-21 NOTE — ED Provider Notes (Signed)
[name] EMERGENCY DEPARTMENT Provider Note   CSN: [id number] Arrival date & time: 12/21/18  1835     History   Chief Complaint Chief Complaint  Patient presents with  . Dental Pain  . Abscess    HPI Corey Alexander is a 33 y.o. male.  The history is provided by the patient and medical records. No language interpreter was used.  Dental Pain  Associated symptoms: no congestion, no facial swelling and no fever   Abscess  Associated symptoms: no fever     Corey Alexander is a 33 y.o. male who presents to the Emergency Department complaining of persistent, gradually worsening, left-sided, upper dental pain beginning 3-4 days ago. Pt describes their pain as throbbing. Pt has been taking BC powder at home with minimal relief of pain. They are not currently followed by dentistry, he does have dental insurance through his job, so he can see someone easily.  Pt denies facial swelling, fever, chills, difficulty breathing, difficulty swallowing.   Past Medical History:  Diagnosis Date  . Asthma     There are no active problems to display for this patient.   History reviewed. No pertinent surgical history.      Home Medications    Prior to Admission medications   Medication Sig Start Date End Date Taking? Authorizing Provider  ibuprofen (ADVIL,MOTRIN) 800 MG tablet Take 1 tablet (800 mg total) by mouth every 8 (eight) hours as needed for mild pain or moderate pain. 12/21/18   Lennex Pietila, Chase Picket, PA-C  penicillin v potassium (VEETID) 500 MG tablet Take 1 tablet (500 mg total) by mouth 4 (four) times daily for 7 days. 12/21/18 12/28/18  Nayellie Sanseverino, Chase Picket, PA-C    Family History No family history on file.  Social History Social History   Tobacco Use  . Smoking status: Current Every Day Smoker    Types: Cigarettes  . Smokeless tobacco: Never Used  Substance Use Topics  . Alcohol use: Yes    Comment: social  . Drug use: No     Allergies     Patient has no known allergies.   Review of Systems Review of Systems  Constitutional: Negative for chills and fever.  HENT: Positive for dental problem. Negative for congestion, facial swelling, sore throat and trouble swallowing.   Respiratory: Negative for shortness of breath.      Physical Exam Updated Vital Signs BP 139/87 (BP Location: Right Arm)   Pulse 69   Temp 98.3 F (36.8 C) (Oral)   Resp 14   SpO2 100%   Physical Exam Vitals signs and nursing note reviewed.  Constitutional:      General: He is not in acute distress.    Appearance: He is well-developed.  HENT:     Head: Normocephalic and atraumatic.     Mouth/Throat:      Comments: Dental cavities and poor oral dentition noted. Pain along tooth as depicted in image. No abscess requiring I&D. Midline uvula. No trismus. OP moist and clear. No oropharyngeal erythema or edema. Neck supple with no tenderness. No facial edema. Neck:     Musculoskeletal: Neck supple.  Cardiovascular:     Rate and Rhythm: Normal rate and regular rhythm.     Heart sounds: Normal heart sounds. No murmur.  Pulmonary:     Effort: Pulmonary effort is normal. No respiratory distress.     Breath sounds: Normal breath sounds. No wheezing or rales.  Musculoskeletal: Normal range of motion.  Skin:  General: Skin is warm and dry.  Neurological:     Mental Status: He is alert.      ED Treatments / Results  Labs (all labs ordered are listed, but only abnormal results are displayed) Labs Reviewed - No data to display  EKG None  Radiology No results found.  Procedures Procedures (including critical care time)  Medications Ordered in ED Medications - No data to display   Initial Impression / Assessment and Plan / ED Course  I have reviewed the triage vital signs and the nursing notes.  Pertinent labs & imaging results that were available during my care of the patient were reviewed by me and considered in my medical decision  making (see chart for details).    Corey Alexander is a 33 y.o. male who presents to ED for dental pain. No abscess requiring immediate incision and drainage. Patient is afebrile, non toxic appearing, and swallowing secretions well. Exam not concerning for Ludwig's angina or pharyngeal abscess. Will treat with PenVK. I provided dental resource guide and stressed the importance of dental follow up for ultimate management of dental pain. Patient voices understanding and is agreeable to plan.  Final Clinical Impressions(s) / ED Diagnoses   Final diagnoses:  Dental infection    ED Discharge Orders         Ordered    ibuprofen (ADVIL,MOTRIN) 800 MG tablet  Every 8 hours PRN     12/21/18 1848    penicillin v potassium (VEETID) 500 MG tablet  4 times daily     12/21/18 1848           Kyonna Frier, Chase Picket, PA-C 12/21/18 1857    Eber Hong, MD 12/24/18 1006

## 2018-12-21 NOTE — ED Triage Notes (Signed)
Pt reports left upper dental pain and swelling...possible abscess for the past 3-4 days from a broken tooth.

## 2018-12-21 NOTE — Discharge Instructions (Signed)
You have a dental infection. It is very important that you get evaluated by a dentist as soon as possible. Call tomorrow to schedule an appointment. Ibuprofen as needed for pain. Take your full course of antibiotics. Read the instructions below.  Eat a soft or liquid diet and rinse your mouth out after meals with warm water. You should see a dentist or return here at once if you have increased swelling, increased pain or uncontrolled bleeding from the site of your injury.  SEEK MEDICAL CARE IF:  You have increased pain not controlled with medicines.  You have swelling around your tooth, in your face or neck.  You have a fever  If you are unable to open your mouth New or worsening symptoms develop, any additional concerns.

## 2019-03-05 IMAGING — CT CT RENAL STONE PROTOCOL
2 series · 15 of 42 positions shown, 18 images · non-contrast
Comparison: None.

CLINICAL DATA: Left flank pain since this morning.

EXAM:
CT ABDOMEN AND PELVIS WITHOUT CONTRAST
TECHNIQUE: Multidetector CT imaging of the abdomen and pelvis was performed
following the standard protocol without IV contrast.

[Series 3: renal stone 5mm · axial · 0.82mm/px · z∈[+689,+1124]mm · 12 of 98 slices shown, 15 images]
[im 7/98  soft-tissue]
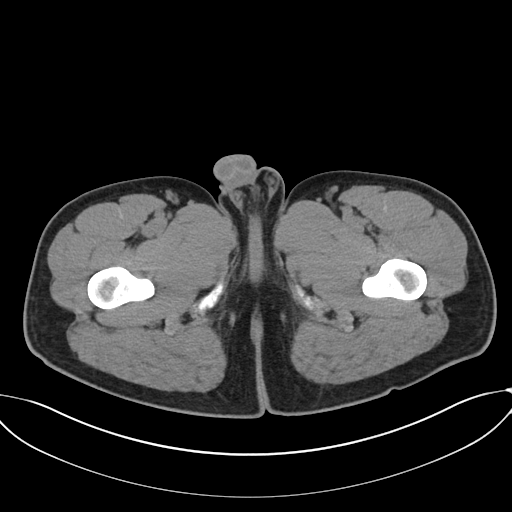
[im 7/98  bone]
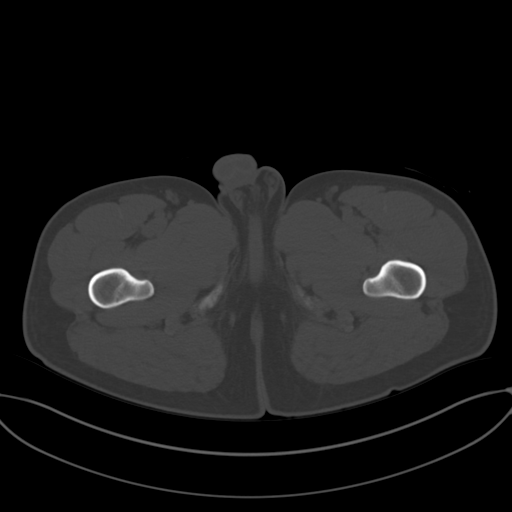
[im 19/98  soft-tissue]
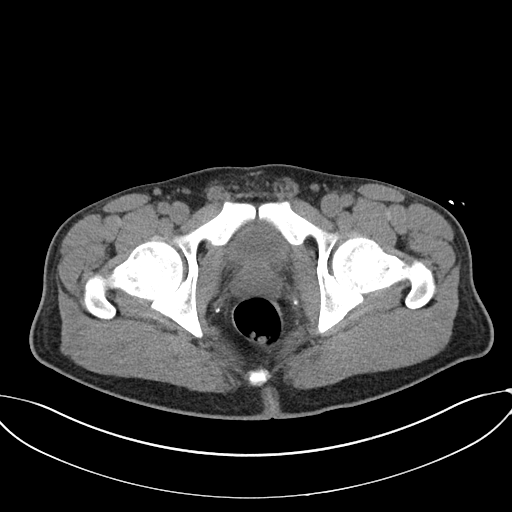
[im 29/98  soft-tissue]
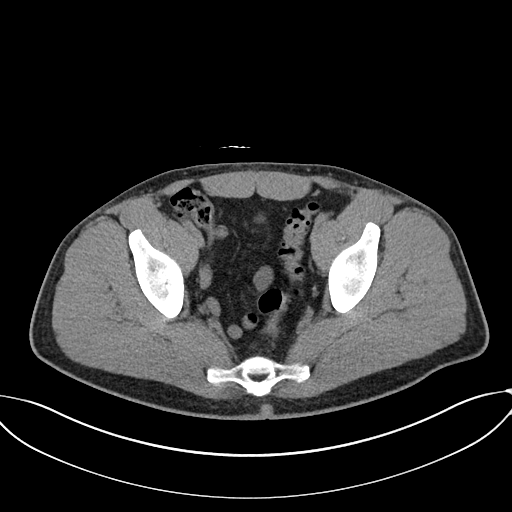
[im 38/98  soft-tissue]
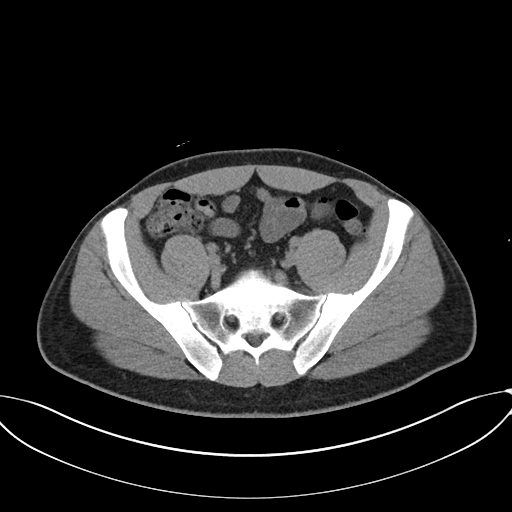
[im 51/98  soft-tissue]
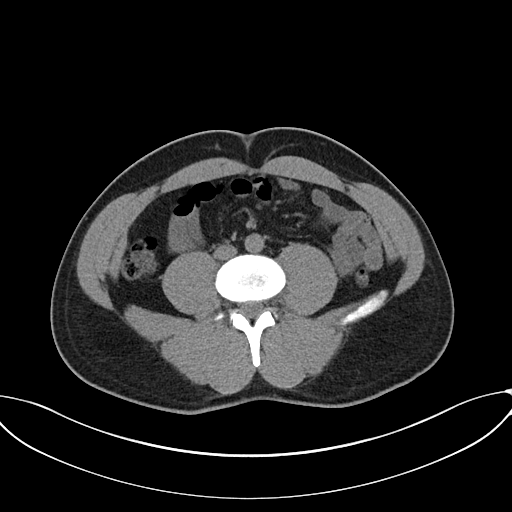
[im 60/98  soft-tissue]
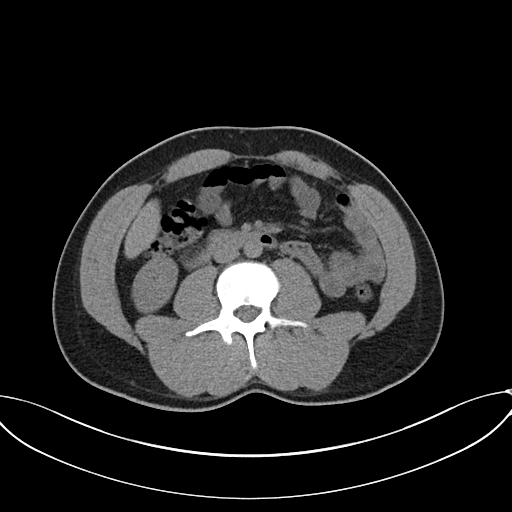
[im 69/98  soft-tissue]
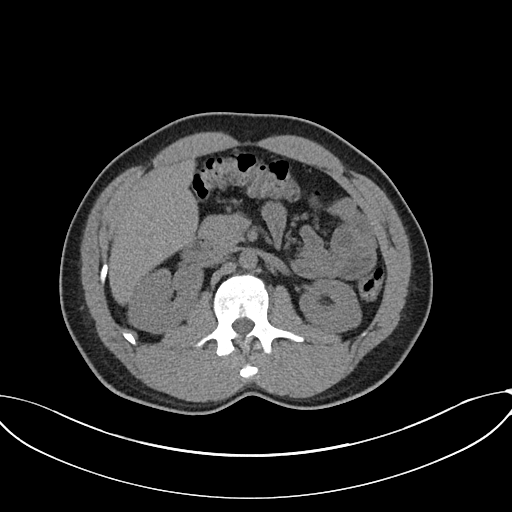
[im 82/98  soft-tissue]
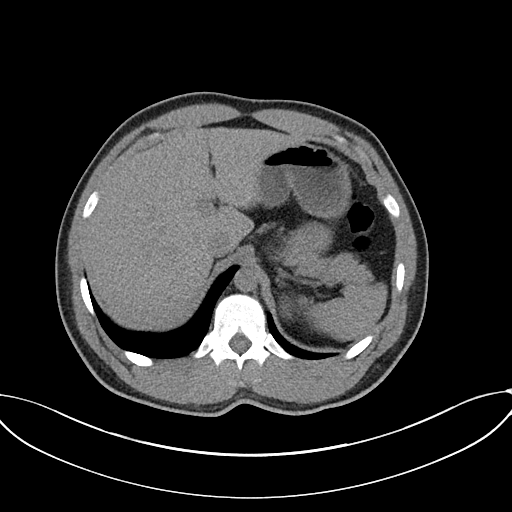
[im 85/98  lung]
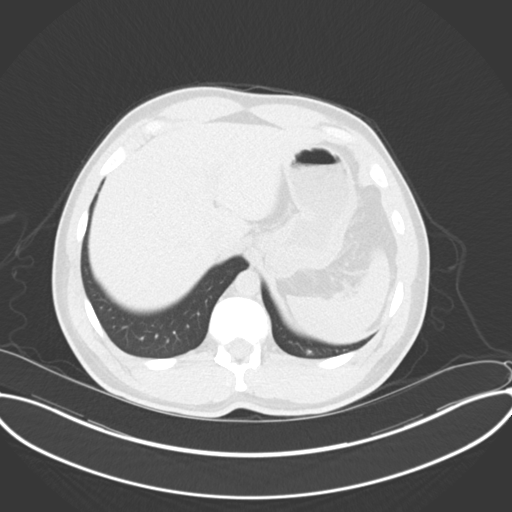
[im 88/98  lung]
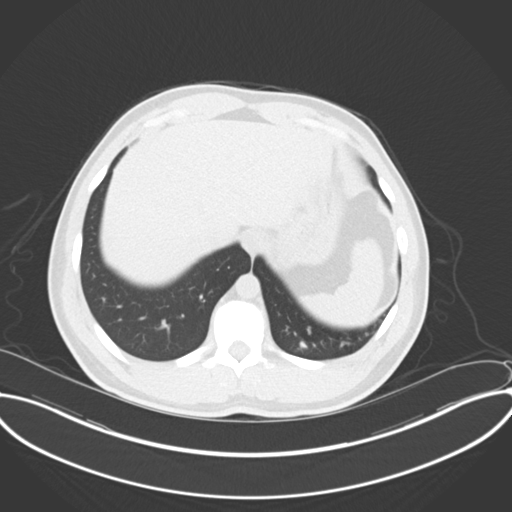
[im 91/98  soft-tissue]
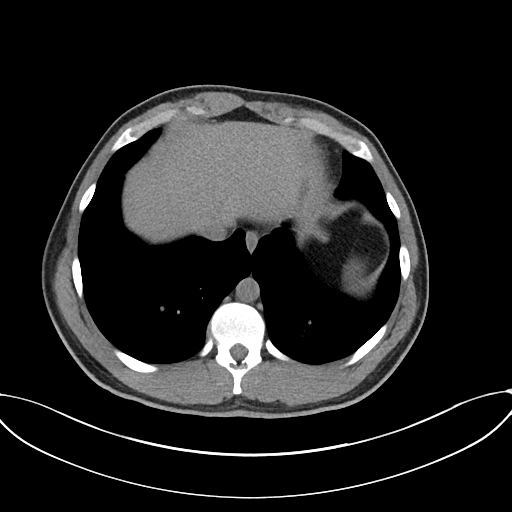
[im 91/98  lung]
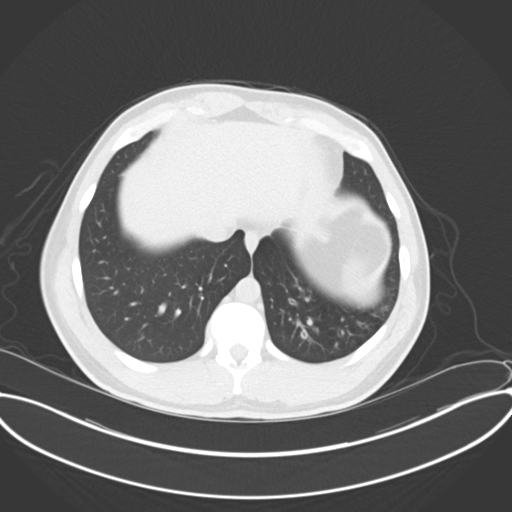
[im 91/98  bone]
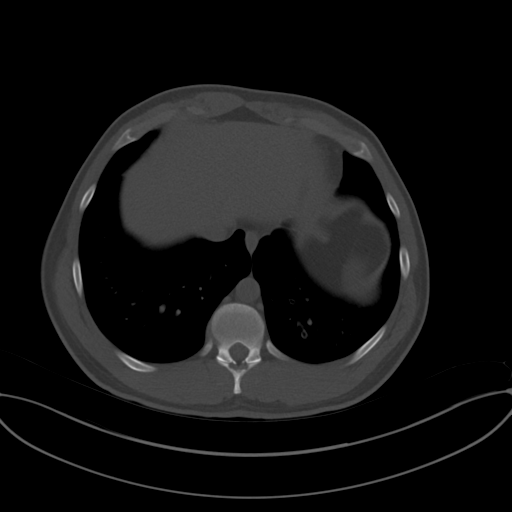
[im 94/98  lung]
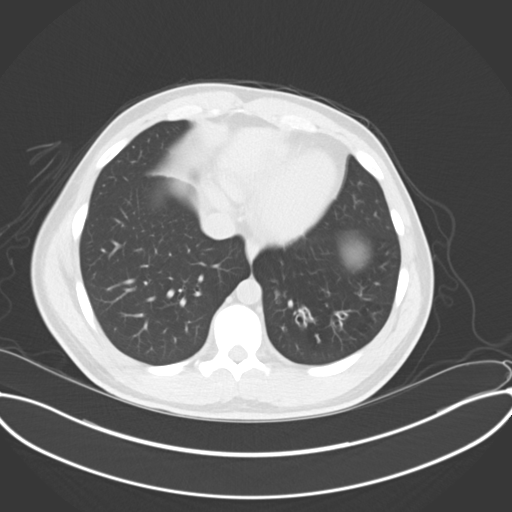

[Series 5: renal stone 3.0 cor · coronal · 0.73mm/px · 3 of 95 slices shown]
[im 32/95  soft-tissue]
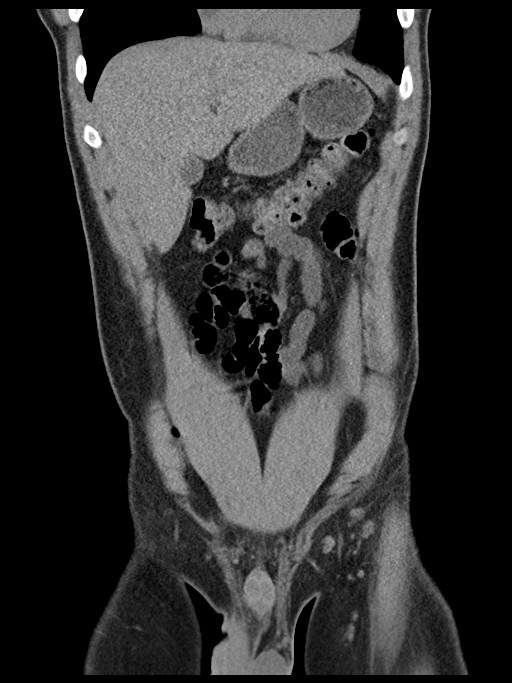
[im 42/95  soft-tissue]
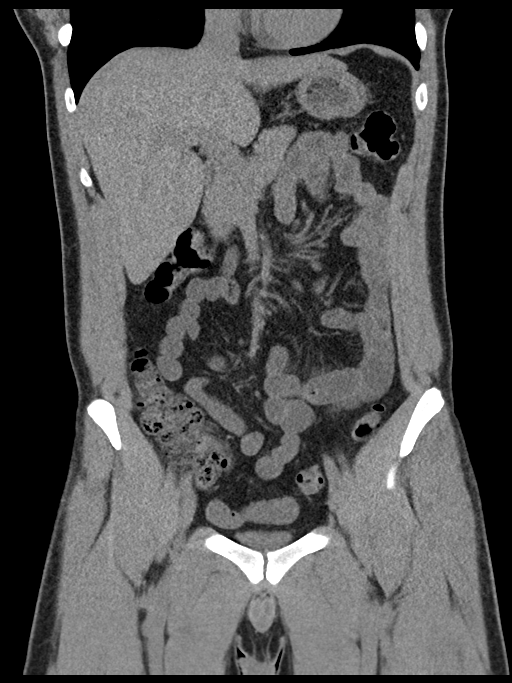
[im 53/95  soft-tissue]
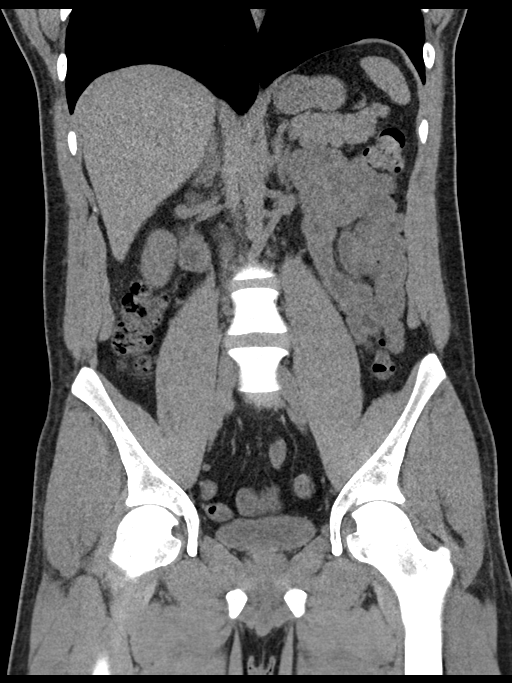

[15 of 42 positions shown; findings below may reference images not displayed]

FINDINGS: Lower chest: No acute abnormality.

Hepatobiliary: No focal liver abnormality is seen. No gallstones,
gallbladder wall thickening, or biliary dilatation.

Pancreas: Unremarkable. No pancreatic ductal dilatation or
surrounding inflammatory changes.

Spleen: Normal in size without focal abnormality.

Adrenals/Urinary Tract: Adrenal glands are unremarkable. Kidneys are
normal, without renal calculi, focal lesion, or hydronephrosis.
Bladder is unremarkable.

Stomach/Bowel: Stomach is within normal limits. Appendix appears
normal. No evidence of bowel wall thickening, distention, or
inflammatory changes.

Vascular/Lymphatic: No significant vascular findings are present. No
enlarged abdominal or pelvic lymph nodes.

Reproductive: Prostate is unremarkable.

Other: No abdominal wall hernia or abnormality. No abdominopelvic
ascites.

Musculoskeletal: No acute or significant osseous findings.
IMPRESSION: No acute abnormality identified in the abdomen and pelvis. No
nephrolithiasis or hydroureteronephrosis is identified bilaterally.

## 2019-04-01 ENCOUNTER — Other Ambulatory Visit: Payer: Self-pay

## 2019-04-01 ENCOUNTER — Encounter (HOSPITAL_COMMUNITY): Payer: Self-pay | Admitting: Emergency Medicine

## 2019-04-01 ENCOUNTER — Emergency Department (HOSPITAL_COMMUNITY)
Admission: EM | Admit: 2019-04-01 | Discharge: 2019-04-01 | Disposition: A | Discharge status: Home / Self Care | Payer: Self-pay | Attending: Emergency Medicine | Admitting: Emergency Medicine

## 2019-04-01 DIAGNOSIS — J029 Acute pharyngitis, unspecified: Secondary | ICD-10-CM | POA: Insufficient documentation

## 2019-04-01 DIAGNOSIS — F17210 Nicotine dependence, cigarettes, uncomplicated: Secondary | ICD-10-CM | POA: Insufficient documentation

## 2019-04-01 DIAGNOSIS — Z202 Contact with and (suspected) exposure to infections with a predominantly sexual mode of transmission: Secondary | ICD-10-CM | POA: Insufficient documentation

## 2019-04-01 DIAGNOSIS — R51 Headache: Secondary | ICD-10-CM | POA: Insufficient documentation

## 2019-04-01 LAB — GROUP A STREP BY PCR: Group A Strep by PCR: NOT DETECTED

## 2019-04-01 MED ORDER — IBUPROFEN 800 MG PO TABS: 800.0000 mg | ORAL_TABLET | Freq: Three times a day (TID) | ORAL | 0 refills | Status: AC

## 2019-04-01 MED ORDER — IBUPROFEN 200 MG PO TABS
600.0000 mg | ORAL_TABLET | Freq: Once | ORAL | Status: AC
  Administered 2019-04-01: 600 mg via ORAL
  Filled 2019-04-01: qty 1

## 2019-04-01 NOTE — ED Triage Notes (Signed)
Pt to ED with c/o sore throat and painful to swallow x's 2 days

## 2019-04-01 NOTE — ED Provider Notes (Signed)
Emergency Department Provider Note  ____________________________________________  Time seen: Approximately 9:05 PM  I have reviewed the triage vital signs and the nursing notes.   HISTORY  Chief Complaint Sore Throat    HPI Corey Alexander is a 33 y.o. male presents to the emergency department with pharyngitis and mild headache for the past 2 days.  Patient has been tolerating his own secretions and is having no difficulty swallowing.  He denies nausea or vomiting at home.  Patient denies rhinorrhea, congestion or nonproductive cough.  No known contacts with COVID-19.  Patient reports that he has occasionally had unprotected sex.  He denies penile discharge, dysuria or hematuria.  No other alleviating measures have been attempted.        Past Medical History:  Diagnosis Date  . Asthma     There are no active problems to display for this patient.   History reviewed. No pertinent surgical history.  Prior to Admission medications   Medication Sig Start Date End Date Taking? Authorizing Provider  ibuprofen (ADVIL) 800 MG tablet Take 1 tablet (800 mg total) by mouth 3 (three) times daily. 04/01/19   Orvil FeilWoods, [place] Zaragoza M, PA-C    Allergies Patient has no known allergies.  No family history on file.  Social History Social History   Tobacco Use  . Smoking status: Current Every Day Smoker    Types: Cigarettes  . Smokeless tobacco: Never Used  Substance Use Topics  . Alcohol use: Yes    Comment: social  . Drug use: No     Review of Systems  Constitutional: No fever/chills Eyes: No visual changes. No discharge ENT: Patient has pharyngitis.  Cardiovascular: no chest pain. Respiratory: no cough. No SOB. Gastrointestinal: No abdominal pain.  No nausea, no vomiting.  No diarrhea.  No constipation. Genitourinary: Negative for dysuria. No hematuria Musculoskeletal: Negative for musculoskeletal pain. Skin: Negative for rash, abrasions, lacerations,  ecchymosis. Neurological: Patient has headache.    ____________________________________________   PHYSICAL EXAM:  VITAL SIGNS: ED Triage Vitals  Enc Vitals Group     BP 04/01/19 1956 130/90     Pulse Rate 04/01/19 1956 83     Resp 04/01/19 1956 16     Temp 04/01/19 1956 99.4 F (37.4 C)     Temp Source 04/01/19 1956 Oral     SpO2 04/01/19 1956 99 %     Weight 04/01/19 1956 195 lb (88.5 kg)     Height 04/01/19 1956 5\' 11"  (1.803 m)     Head Circumference --      Peak Flow --      Pain Score 04/01/19 2001 9     Pain Loc --      Pain Edu? --      Excl. in GC? --      Constitutional: Alert and oriented. Well appearing and in no acute distress. Eyes: Conjunctivae are normal. PERRL. EOMI. Head: Atraumatic. ENT:      Ears: TMs are pearly bilaterally.      Nose: No congestion/rhinnorhea.      Mouth/Throat: Mucous membranes are moist.  Posterior pharynx is erythematous with symmetrical tonsillar hypertrophy. Neck: No stridor.  No cervical spine tenderness to palpation. Hematological/Lymphatic/Immunilogical: Palpable cervical lymphadenopathy. Cardiovascular: Normal rate, regular rhythm. Normal S1 and S2.  Good peripheral circulation. Respiratory: Normal respiratory effort without tachypnea or retractions. Lungs CTAB. Good air entry to the bases with no decreased or absent breath sounds. Musculoskeletal: Full range of motion to all extremities. No gross deformities appreciated. Neurologic:  Normal speech and language. No gross focal neurologic deficits are appreciated.  Skin:  Skin is warm, dry and intact. No rash noted. Psychiatric: Mood and affect are normal. Speech and behavior are normal. Patient exhibits appropriate insight and judgement.   ____________________________________________   LABS (all labs ordered are listed, but only abnormal results are displayed)  Labs Reviewed  GROUP A STREP BY PCR  GC/CHLAMYDIA PROBE AMP () NOT AT [facility]    ____________________________________________  EKG   ____________________________________________  RADIOLOGY   No results found.  ____________________________________________    PROCEDURES  Procedure(s) performed:    Procedures    Medications  ibuprofen (ADVIL) tablet 600 mg (600 mg Oral Given 04/01/19 2106)     ____________________________________________   INITIAL IMPRESSION / ASSESSMENT AND PLAN / ED COURSE  Pertinent labs & imaging results that were available during my care of the patient were reviewed by me and considered in my medical decision making (see chart for details).  Review of the [place] CSRS was performed in accordance of the [facility] prior to dispensing any controlled drugs.           Assessment and Plan:  Pharyngitis 33 year old male presents to the emergency department with pharyngitis for the past 2 days.  On physical exam, patient had an erythematous posterior pharynx with symmetrical tonsillar hypertrophy but no exudate.  There is no tender cervical lymphadenopathy on physical exam.  No rhinorrhea, congestion or nonproductive cough.  Patient did state that he had had recent unprotected sex.  Differential diagnosis included group A strep pharyngitis, viral pharyngitis, seasonal allergies and gonorrhea/chlamydia.  Patient tested negative for group A strep in the emergency department.  Gonorrhea and Chlamydia testing occurred in the emergency department is currently pending.  Patient was discharged with ibuprofen.  Patient was given strict return precautions to return to the emergency department with new or worsening symptoms.  Patient was able to tolerate his own secretions and was speaking in complete sentences prior to discharge.  Corey Alexander was evaluated in Emergency Department on 04/01/2019 for the symptoms described in the history of present illness. He was evaluated in the context of the global COVID-19 pandemic, which necessitated  consideration that the patient might be at risk for infection with the SARS-CoV-2 virus that causes COVID-19. Institutional protocols and algorithms that pertain to the evaluation of patients at risk for COVID-19 are in a state of rapid change based on information released by regulatory bodies including the CDC and federal and state organizations. These policies and algorithms were followed during the patient's care in the ED.     ____________________________________________  FINAL CLINICAL IMPRESSION(S) / ED DIAGNOSES  Final diagnoses:  Pharyngitis, unspecified etiology      NEW MEDICATIONS STARTED DURING THIS VISIT:  ED Discharge Orders         Ordered    ibuprofen (ADVIL) 800 MG tablet  3 times daily     04/01/19 2116              This chart was dictated using voice recognition software/Dragon. Despite best efforts to proofread, errors can occur which can change the meaning. Any change was purely unintentional.    Orvil Feil, PA-C 04/01/19 2239    Little, Ambrose Finland, MD 04/03/19 1259

## 2019-04-02 LAB — GC/CHLAMYDIA PROBE AMP (~~LOC~~) NOT AT ARMC
Chlamydia: NEGATIVE
Neisseria Gonorrhea: NEGATIVE

## 2019-04-03 ENCOUNTER — Telehealth: Payer: Self-pay | Admitting: Nurse Practitioner

## 2019-04-03 DIAGNOSIS — J029 Acute pharyngitis, unspecified: Secondary | ICD-10-CM

## 2019-04-03 NOTE — Progress Notes (Signed)
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
## 2019-09-02 ENCOUNTER — Other Ambulatory Visit
Admission: RE | Admit: 2019-09-02 | Discharge: 2019-09-02 | Disposition: A | Discharge status: Home / Self Care | Payer: Self-pay | Source: Ambulatory Visit

## 2019-09-02 ENCOUNTER — Other Ambulatory Visit: Admission: RE | Admit: 2019-09-02 | Payer: Self-pay | Source: Ambulatory Visit

## 2019-09-02 DIAGNOSIS — A749 Chlamydial infection, unspecified: Secondary | ICD-10-CM | POA: Insufficient documentation

## 2019-09-02 DIAGNOSIS — Z711 Person with feared health complaint in whom no diagnosis is made: Secondary | ICD-10-CM | POA: Insufficient documentation

## 2019-09-03 LAB — CHLAMYDIA PLASMID DNA AMPLIFICATION: Chlamydia Plasmid DNA Amplification: POSITIVE — AB

## 2019-09-03 LAB — TRICHOMONAS DNA AMPLIFICATION: Trichomonas DNA amplification: 0

## 2019-09-03 LAB — N. GONORRHOEAE DNA AMPLIFICATION: N. gonorrhoeae DNA Amplification: 0

## 2019-10-19 ENCOUNTER — Other Ambulatory Visit
Admission: RE | Admit: 2019-10-19 | Discharge: 2019-10-19 | Disposition: A | Discharge status: Home / Self Care | Payer: Medicaid Other | Source: Ambulatory Visit

## 2019-10-21 LAB — VARICELLA ZOSTER IGG AB: VZV IgG: POSITIVE

## 2019-10-21 LAB — RUBELLA ANTIBODY, IGG: Rubella IgG AB: POSITIVE

## 2019-10-21 LAB — MEASLES IGG AB: Measles IgG: POSITIVE

## 2019-12-31 NOTE — Progress Notes (Signed)
Received an inquiry that this patient is interested in becoming a new patient at [facility].  Attempted to contact the patient and left a voicemail for patient to return our call if they're still interested in becoming a patient.  Phone #: [phone]

## 2020-01-16 ENCOUNTER — Emergency Department: Payer: Medicaid Other

## 2020-01-16 ENCOUNTER — Other Ambulatory Visit: Payer: Self-pay

## 2020-01-16 ENCOUNTER — Emergency Department
Admission: EM | Admit: 2020-01-16 | Discharge: 2020-01-16 | Disposition: A | Discharge status: Home / Self Care | Payer: Medicaid Other | Source: Ambulatory Visit | Attending: Student in an Organized Health Care Education/Training Program | Admitting: Student in an Organized Health Care Education/Training Program

## 2020-01-16 ENCOUNTER — Encounter: Payer: Self-pay | Admitting: Student in an Organized Health Care Education/Training Program

## 2020-01-16 DIAGNOSIS — Y9389 Activity, other specified: Secondary | ICD-10-CM | POA: Insufficient documentation

## 2020-01-16 DIAGNOSIS — Y998 Other external cause status: Secondary | ICD-10-CM | POA: Insufficient documentation

## 2020-01-16 DIAGNOSIS — S62611A Displaced fracture of proximal phalanx of left index finger, initial encounter for closed fracture: Secondary | ICD-10-CM

## 2020-01-16 DIAGNOSIS — S62601A Fracture of unspecified phalanx of left index finger, initial encounter for closed fracture: Secondary | ICD-10-CM

## 2020-01-16 DIAGNOSIS — W3400XA Accidental discharge from unspecified firearms or gun, initial encounter: Secondary | ICD-10-CM

## 2020-01-16 DIAGNOSIS — Z23 Encounter for immunization: Secondary | ICD-10-CM | POA: Insufficient documentation

## 2020-01-16 DIAGNOSIS — S62611B Displaced fracture of proximal phalanx of left index finger, initial encounter for open fracture: Secondary | ICD-10-CM | POA: Insufficient documentation

## 2020-01-16 DIAGNOSIS — Y92410 Unspecified street and highway as the place of occurrence of the external cause: Secondary | ICD-10-CM | POA: Insufficient documentation

## 2020-01-16 DIAGNOSIS — S61412A Laceration without foreign body of left hand, initial encounter: Secondary | ICD-10-CM

## 2020-01-16 DIAGNOSIS — S61402A Unspecified open wound of left hand, initial encounter: Secondary | ICD-10-CM

## 2020-01-16 LAB — HOLD BLUE

## 2020-01-16 LAB — HOLD GREEN WITH GEL

## 2020-01-16 LAB — HOLD SST

## 2020-01-16 LAB — HOLD RED

## 2020-01-16 LAB — HOLD LAVENDER

## 2020-01-16 LAB — BLOOD BANK HOLD LAVENDER

## 2020-01-16 MED ORDER — FENTANYL CITRATE 50 MCG/ML IJ SOLN *WRAPPED*
50.0000 ug | Freq: Once | INTRAMUSCULAR | Status: AC
Start: 2020-01-16 — End: 2020-01-16
  Administered 2020-01-16: 07:00:00 50 ug via INTRAVENOUS
  Filled 2020-01-16: qty 2

## 2020-01-16 MED ORDER — OXYCODONE HCL 5 MG PO TABS *I*
5.0000 mg | ORAL_TABLET | ORAL | Status: DC | PRN
Start: 2020-01-16 — End: 2020-01-16
  Administered 2020-01-16: 5 mg via ORAL
  Filled 2020-01-16: qty 1

## 2020-01-16 MED ORDER — LIDOCAINE HCL 1 % IJ SOLN *I*
INTRAMUSCULAR | Status: AC
Start: 2020-01-16 — End: 2020-01-16
  Administered 2020-01-16: 20 mL via SUBCUTANEOUS
  Filled 2020-01-16: qty 20

## 2020-01-16 MED ORDER — OXYCODONE HCL 10 MG PO TABS *I*
10.0000 mg | ORAL_TABLET | ORAL | Status: DC | PRN
Start: 2020-01-16 — End: 2020-01-16

## 2020-01-16 MED ORDER — ACETAMINOPHEN 500 MG PO TABS *I*
1000.0000 mg | ORAL_TABLET | Freq: Once | ORAL | Status: AC
Start: 2020-01-16 — End: 2020-01-16
  Administered 2020-01-16: 1000 mg via ORAL
  Filled 2020-01-16: qty 2

## 2020-01-16 MED ORDER — OXYCODONE HCL 5 MG PO TABS *I*
5.0000 mg | ORAL_TABLET | Freq: Four times a day (QID) | ORAL | 0 refills | Status: AC | PRN
Start: 2020-01-16 — End: 2020-01-19
  Filled 2020-01-16: qty 12, 3d supply, fill #0

## 2020-01-16 MED ORDER — CEFAZOLIN 1000 MG IN STERILE WATER 10ML SYRINGE *I*
2000.0000 mg | PREFILLED_SYRINGE | Freq: Once | INTRAVENOUS | Status: AC
Start: 2020-01-16 — End: 2020-01-16
  Administered 2020-01-16: 2000 mg via INTRAVENOUS
  Filled 2020-01-16: qty 20

## 2020-01-16 MED ORDER — TETANUS-DIPHTH-ACELL PERT 5-2.5-18.5 LF-MCG/0.5 IM SUSP *WRAPPED*
0.5000 mL | Freq: Once | INTRAMUSCULAR | Status: AC
Start: 2020-01-16 — End: 2020-01-16
  Administered 2020-01-16: 0.5 mL via INTRAMUSCULAR
  Filled 2020-01-16: qty 0.5

## 2020-01-16 MED ORDER — CEPHALEXIN 500 MG PO CAPS *I*
500.0000 mg | ORAL_CAPSULE | Freq: Four times a day (QID) | ORAL | 0 refills | Status: AC
Start: 2020-01-16 — End: 2020-01-21
  Filled 2020-01-16: qty 20, 5d supply, fill #0

## 2020-01-16 MED ORDER — FENTANYL CITRATE 50 MCG/ML IJ SOLN *WRAPPED*
50.0000 ug | Freq: Once | INTRAMUSCULAR | Status: AC
Start: 2020-01-16 — End: 2020-01-16
  Administered 2020-01-16: 08:00:00 50 ug via INTRAVENOUS
  Filled 2020-01-16: qty 2

## 2020-01-16 MED ORDER — HYDROMORPHONE HCL PF 1 MG/ML IJ SOLN *WRAPPED*
0.5000 mg | Freq: Once | INTRAMUSCULAR | Status: AC
Start: 2020-01-16 — End: 2020-01-16
  Administered 2020-01-16: 0.5 mg via INTRAVENOUS
  Filled 2020-01-16: qty 1

## 2020-01-16 MED ORDER — LIDOCAINE HCL 1 % IJ SOLN *I*
20.0000 mL | Freq: Once | INTRAMUSCULAR | Status: AC
Start: 2020-01-16 — End: 2020-01-16
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
## 2020-01-16 NOTE — ED Notes (Signed)
Patient safe to discharge to home and has access to residence. Discharge paperwork reviewed and signed by patient. Last recorded VS WDL, IV access removed and all personal belongings left with patient. Patient walked out of the ED. Patient has capacity to leave alone, patient told where to pick up his medications. Patient also reminded to make an appointment to be seen in 1 week.
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
## 2020-01-16 NOTE — ED Notes (Signed)
Patient walked out to the vending machine to get a drink, RN explained to patient he needed to not drink anything incase he had to have surgery on his hand. Patient continued to the vending machine and started drinking Gatorade.

## 2020-01-16 NOTE — ED Notes (Signed)
ED RN INTERN ATTESTATION       I Daisy Floro, RN (RN) reviewed the following charting information by the RN intern: [name]    Nursing Assessments  Medications  Plan of Care  Teaching   Notes    In the chart of [name] (33 y.o. male) and attest to the charting being accurate.
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
## 2020-01-18 LAB — BLOOD BANK HOLD RED

## 2020-01-22 ENCOUNTER — Ambulatory Visit: Payer: Medicaid Other

## 2020-01-22 ENCOUNTER — Ambulatory Visit
Admission: RE | Admit: 2020-01-22 | Discharge: 2020-01-22 | Disposition: A | Discharge status: Home / Self Care | Payer: Medicaid Other | Source: Ambulatory Visit | Attending: Family | Admitting: Family

## 2020-01-22 ENCOUNTER — Ambulatory Visit: Payer: Medicaid Other | Admitting: Orthopedic Surgery

## 2020-01-22 ENCOUNTER — Encounter: Payer: Self-pay | Admitting: Orthopedic Surgery

## 2020-01-22 VITALS — BP 126/71 | HR 95 | Ht 71.0 in | Wt 205.0 lb

## 2020-01-22 DIAGNOSIS — S61432A Puncture wound without foreign body of left hand, initial encounter: Secondary | ICD-10-CM

## 2020-01-22 DIAGNOSIS — S62619A Displaced fracture of proximal phalanx of unspecified finger, initial encounter for closed fracture: Secondary | ICD-10-CM | POA: Insufficient documentation

## 2020-01-22 DIAGNOSIS — S62611A Displaced fracture of proximal phalanx of left index finger, initial encounter for closed fracture: Secondary | ICD-10-CM | POA: Insufficient documentation

## 2020-01-22 DIAGNOSIS — S62641B Nondisplaced fracture of proximal phalanx of left index finger, initial encounter for open fracture: Secondary | ICD-10-CM | POA: Insufficient documentation

## 2020-01-22 DIAGNOSIS — S61532A Puncture wound without foreign body of left wrist, initial encounter: Secondary | ICD-10-CM | POA: Insufficient documentation

## 2020-01-22 DIAGNOSIS — Z01812 Encounter for preprocedural laboratory examination: Secondary | ICD-10-CM | POA: Insufficient documentation

## 2020-01-22 DIAGNOSIS — Z20828 Contact with and (suspected) exposure to other viral communicable diseases: Secondary | ICD-10-CM | POA: Insufficient documentation

## 2020-01-22 DIAGNOSIS — W3400XA Accidental discharge from unspecified firearms or gun, initial encounter: Secondary | ICD-10-CM

## 2020-01-22 DIAGNOSIS — Z20822 Contact with and (suspected) exposure to covid-19: Secondary | ICD-10-CM | POA: Insufficient documentation

## 2020-01-22 LAB — COVID-19 NAAT (PCR): COVID-19 NAAT (PCR): NEGATIVE

## 2020-01-22 LAB — COVID-19 PCR

## 2020-01-22 MED ORDER — GABAPENTIN 100 MG PO CAPSULE *I*
100.0000 mg | ORAL_CAPSULE | Freq: Three times a day (TID) | ORAL | 0 refills | Status: DC
Start: 2020-01-22 — End: 2020-05-02

## 2020-01-22 NOTE — Procedures (Addendum)
Cast/Splint application   Date/Time: 01/22/2020 9:30 AM   Performed by: Sharlee Blew   Authorized by: Ruben Im, MD   Injury  Location details: left hand  Procedure  Immobilization: splint  Splint/Brace type: volar short arm  Supplies used: Ortho-Glass  Post-procedure assessment  Patient tolerance: patient tolerated the procedure well with no immediate complications
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
## 2020-01-22 NOTE — Patient Instructions (Signed)
CAST/SPLINT INSTRUCTIONS  The care of your cast is very important to the healing of your injury.  Please follow these instructions.  If you experience any of these signs or symptoms contact our office immediately:  · Increased swelling  · Severe pain  · Decreased motion  · Numbness or tingling  · Cold or blue/dusky fingers or toes    After the application of your splint or cast it is important to elevate your arm or leg for 24-72 hours to decrease or prevent swelling.  Elevation reduced pain and speeds the healing process by minimizing early swelling.  Move your fingers or toes gently and often to prevent stiffness if you are instructed to.    DO:  · Elevate your cast/splint above your heart.  Use pillows or other support objects.  · Keep your cast/splint clean and dry.  Wetness will weaken the cast/splint and cause skin irritation.  If the cast/splint is slightly damp around the edges, use a hairdryer on a low cool setting to dry it.  If wet, notify our office.  · You may take a shower or bathe if you are able to totally protect your cast/splint.  Wrap your cast in two layers of plastic.  Do not immerse your cast/splint in water.  · You may apply ice in a plastic bag or an ice pack to your cast over the injured area.  · Inspect your cast regularly.  If it becomes cracked or develops soft spots, contact your doctor.   · Keep dirt, sand, powder and lotion away from cast/splint.    DO NOT:  · Do not walk on a "walking cast" until it is completely dry and hard.  It takes about 30 minutes for fiberglass to be hard enough to walk on.  · Do not stick any objects in your cast/splint to scratch your skin.  This may cause skin irritation or pressure which could lead to skin breakdown or infection.  · Do not pull out the padding under your cast/splint.  · Do not break any rough edges of the cast/splint or attempt to remove the cast yourself for any reason.    Contact our office if your cast/splint becomes too tight or loose,  wet or broken, skin irritation, severe itching under cast/splint or for any questions or concerns.  [place] Department of Orthopaedics and Rehabilitation - [phone].

## 2020-01-22 NOTE — Anesthesia Preprocedure Evaluation (Addendum)
Anesthesia Pre-operative History and Physical for [name]    Highlighted Issues for this Procedure:  34 y.o. male with Open nondisplaced fracture of proximal phalanx of left index finger, initial encounter (S62.641B) presenting for Procedure(s) (LRB):  ORIF, INDEX FINGER (Left)  DEBRIDEMENT, OPEN FRACTURE DISLOCATION, INDEX FINGER (Left)  EXPLORATION OR REPAIR, TENDON OR NERVE, HAND (Left)       .  Marland Kitchen  Anesthesia Evaluation Information Source: records, patient     ANESTHESIA HISTORY     Denies anesthesia history  Pertinent(-):  No History of anesthetic complications or Family hx of anesthetic complications     Comment: No prior surgery    GENERAL     Denies general issues  Pertinent (-):  No history of anesthetic complications or Family Hx of Anesthetic Complications    HEENT     Denies HEENT issues PULMONARY    + Smoker          tobacco currently, tobacco advised to quit    + Asthma (inhalers in summer with allergies.  No inhaler x many months)          mild intermittent  Pertinent(-):  No recent URI, snoring or sleep apnea    CARDIOVASCULAR     Denies cardiovascular issues  Good(4+METs) Exercise Tolerance  Pertinent(-):  No hypertension, past MI or angina    GI/HEPATIC/RENAL    NPO Status  NPO    >8hrs ago    + GERD (with certain foods)  Pertinent(-):  No liver  issues or renal issues  NEURO/PSYCH     Denies neuro/psych issues  Pertinent(-):  No dizziness/motion sickness, seizures or cerebrovascular event    ENDO/OTHER     Denies endo issues  Pertinent(-):  No diabetes mellitus, thyroid disease    HEMATOLOGIC     Denies hematologic issues  Pertinent(-):  No bruising/bleeding easily         Physical Exam    Airway            Mouth opening: normal            Mallampati: II            TM distance (fb): >3 FB            Neck ROM: full  Dental        Cardiovascular           Rhythm: regular           Rate: normal  No murmur         Pulmonary     breath sounds clear to auscultation    Mental Status     oriented  to person, place and time         ________________________________________________________________________  PLAN  ASA Score  2  Anesthetic Plan MAC - Sedation with ga backup     General Anesthesia/Sedation Maintenance Plan (propofol infusion); Airway (nasal cannula); Line ( use current access); Monitoring (standard ASA); Positioning (supine); PONV Plan (dexamethasone and ondansetron); Pain (per surgical team and intraop local); PostOp (PACU)    Informed Consent     Risks:         Risks discussed were commensurate with the plan listed above with the following specific points: N/V, aspiration and sore throat, Damage to: eyes, nerves and teeth, allergic Rx and unexpected serious injury.    Anesthetic Consent:         Anesthetic plan (and risks as noted above) were discussed with patient    Plan  also discussed with team members including:       CRNA    Responsible Anesthesia Attestation:  I attest that the patient or proxy understands and accepts the risks and benefits of the anesthesia plan. I also attest that I have personally performed a pre-anesthetic examination and evaluation, and prescribed the anesthetic plan for this particular location within 48 hours prior to the anesthetic as documented. Idalia Needle, MD  01/25/20, 8:40 AM

## 2020-01-22 NOTE — Invasive Procedure Plan of Care (Addendum)
[facility] HOSPITAL  [facility]  [facility]    CONSENT FOR MEDICAL  OR SURGICAL PROCEDURE                            Patient Name: [name]  [facility] [address]                                                              DOB: [birth year]         Please read this form or have someone read it to you.   It's important to understand all parts of this form. If something isn't clear, ask [place] to explain.   When you sign it, that means you understand the form and give [place] permission to do this surgery or procedure.     I agree for Ruben Im, MD , and Resident/fellow along with any assistants* they may choose, to treat the following condition(s): Left hand wound and left index finger proximal phalanx fracture    By doing this surgery or procedure on me: Left index finger wound debridement and stabilization of the fracture with pins    This is also known as: Left index finger I&D, left index finger ORIF   Laterality: Left     *if you'd like a list of the assistants, please ask. We can give that to you.    1. The care provider has explained my condition to me. They have told me how the procedure can help me. They have told me about other ways of treating my condition. I understand the care provider cannot guarantee the result of the procedure. If I don't have this procedure, my other choices are: No surgery     2. The care provider has told me the risks (problems that can happen) of the procedure. I understand there may be unwanted results. The risks that are related to this procedure include: Bleeding, infection,pain, loss of function, recurrence, scar tissue formation, injury to blood vessels, tendons or nerves, need for additional procedure    3. I understand that during the procedure, my care provider may find a condition that we didn't know about before the treatment started. Therefore, I agree that my care provider can perform any other treatment which they think is necessary and available.    4. I  understand the care provider may remove tissue, body parts, or materials during this procedure. These materials may be used to help with my diagnosis and treatment. They might also be used for teaching purposes or for research studies that I have separately agreed to participate in. Otherwise they will be disposed of as required by law.    5. My care provider might want a representative from a medical device company to be there during my procedure. I understand that person works for:          The ways they might help my care provider during my procedure include:            6. Here are my decisions about receiving blood, blood products, or tissues. I understand my decisions cover the time before, during and after my procedure, my treatment, and my time in the hospital. After my procedure, if my condition changes a lot, my  care provider will talk with me again about receiving blood or blood products. At that time, my care provider might need me to review and sign another consent form, about getting or refusing blood.    I understand that the blood is from the community blood supply. Volunteers donated the blood, the volunteers were screened for health problems. The blood was examined with very sensitive and accurate tests to look for hepatitis, HIV/AIDS, and other diseases. Before I receive blood, it is tested again to make sure it is the correct type.    My chances of getting a sickness from blood products are small. But no transfusion is 100% safe. I understand that my care provider feels the good I will receive from the blood is greater than the chances of something going wrong. My care provider has answered my questions about blood products.      My decision  about blood or  blood products   Not applicable.        My decision   about tissue  Implants     Not applicable.          I understand this  form.    My care provider  or his/her  assistants have  explained:   What I am having done and why I need it.  What  other choices I can make instead of having this done.  The benefits and possible risks (problems) to me of having this done.  The benefits and possible risks (problems) to me of receiving transplants, blood, or blood products.  There is no guarantee of the results.  The care provider may not stay with me the entire time that I am in the operating or procedure room.  My provider has explained how this may affect my procedure. My provider has answered my  questions about this.         I give my  permission for  this surgery or  procedure.            _______________________________________________                                     My signature  (or parent or other person authorized to sign for you, if you are unable to sign for  yourself or if you are under 48 years old)        ______           Date        _____        Time   Electronic Signatures will display at the bottom of the consent form.    Care provider's statement: I have discussed the planned procedure, including the possibility for transfusion of blood  products or receipt of tissue as necessary; expected benefits; the possible complications and risks; and possible alternatives  and their benefits and risks with the patients or the patient's surrogate. In my opinion, the patient or the patient's surrogate  understands the proposed procedure, its risks, benefits and alternatives.              Electronically signed by: Orvan Seen, NP                                                01/22/2020  Date        11:01 AM        Time       For the extent of the COVID-19 pandemic, [facility] is avoiding physical signatures on consent forms in an effort to reduce transmission of COVID-19. I have reviewed the procedure, risks, and alternative with the patient. All questions have been answered. The patient expresses understanding and has verbally consented to the procedure.

## 2020-01-25 ENCOUNTER — Encounter
Admission: RE | Disposition: A | Discharge status: Home / Self Care | Payer: Self-pay | Source: Ambulatory Visit | Attending: Orthopedic Surgery

## 2020-01-25 ENCOUNTER — Ambulatory Visit: Payer: Medicaid Other | Admitting: Anesthesiology

## 2020-01-25 ENCOUNTER — Encounter: Payer: Self-pay | Admitting: Anesthesiology

## 2020-01-25 ENCOUNTER — Ambulatory Visit
Admission: RE | Admit: 2020-01-25 | Discharge: 2020-01-25 | Disposition: A | Discharge status: Home / Self Care | Payer: Medicaid Other | Source: Ambulatory Visit | Attending: Orthopedic Surgery | Admitting: Orthopedic Surgery

## 2020-01-25 ENCOUNTER — Other Ambulatory Visit: Payer: Self-pay

## 2020-01-25 DIAGNOSIS — F17210 Nicotine dependence, cigarettes, uncomplicated: Secondary | ICD-10-CM | POA: Insufficient documentation

## 2020-01-25 DIAGNOSIS — Y939 Activity, unspecified: Secondary | ICD-10-CM | POA: Insufficient documentation

## 2020-01-25 DIAGNOSIS — Y929 Unspecified place or not applicable: Secondary | ICD-10-CM | POA: Insufficient documentation

## 2020-01-25 DIAGNOSIS — X58XXXA Exposure to other specified factors, initial encounter: Secondary | ICD-10-CM | POA: Insufficient documentation

## 2020-01-25 DIAGNOSIS — S62641B Nondisplaced fracture of proximal phalanx of left index finger, initial encounter for open fracture: Secondary | ICD-10-CM

## 2020-01-25 DIAGNOSIS — S62611B Displaced fracture of proximal phalanx of left index finger, initial encounter for open fracture: Secondary | ICD-10-CM | POA: Insufficient documentation

## 2020-01-25 DIAGNOSIS — S62641A Nondisplaced fracture of proximal phalanx of left index finger, initial encounter for closed fracture: Secondary | ICD-10-CM

## 2020-01-25 DIAGNOSIS — Y998 Other external cause status: Secondary | ICD-10-CM | POA: Insufficient documentation

## 2020-01-25 DIAGNOSIS — S64491A Injury of digital nerve of left index finger, initial encounter: Secondary | ICD-10-CM

## 2020-01-25 DIAGNOSIS — J4520 Mild intermittent asthma, uncomplicated: Secondary | ICD-10-CM | POA: Insufficient documentation

## 2020-01-25 HISTORY — PX: PR NEUROPLASTY DIGITAL 1/BOTH SAME DIGIT: 64702

## 2020-01-25 HISTORY — PX: PR DBRDMT FX&/DISLC SUBQ T/M/F BONE: 11012

## 2020-01-25 HISTORY — PX: PR OPEN TX PHALANGEAL SHAFT FRACTURE PROX/MIDDLE EA: 26735

## 2020-01-25 SURGERY — ORIF, FINGER
Anesthesia: Monitor Anesthesia Care | Site: Finger | Laterality: Left | Wound class: Contaminated

## 2020-01-25 MED ORDER — HALOPERIDOL LACTATE 5 MG/ML IJ SOLN *I*
1.0000 mg | Freq: Once | INTRAMUSCULAR | Status: AC | PRN
Start: 2020-01-25 — End: 2020-01-25

## 2020-01-25 MED ORDER — LIDOCAINE-EPINEPHRINE 1 %-1:100000 IJ SOLN *I*
INTRAMUSCULAR | Status: DC | PRN
Start: 2020-01-25 — End: 2020-01-25
  Administered 2020-01-25: 13 mL via SUBCUTANEOUS

## 2020-01-25 MED ORDER — MIDAZOLAM HCL 1 MG/ML IJ SOLN *I* WRAPPED
INTRAMUSCULAR | Status: DC | PRN
Start: 2020-01-25 — End: 2020-01-25
  Administered 2020-01-25: 4 mg via INTRAVENOUS

## 2020-01-25 MED ORDER — ACETAMINOPHEN 160 MG/5 ML WRAPPED *I*
975.0000 mg | Freq: Once | Status: AC
Start: 2020-01-25 — End: 2020-01-25

## 2020-01-25 MED ORDER — LIDOCAINE HCL 2 % IJ SOLN *I*
INTRAMUSCULAR | Status: DC | PRN
Start: 2020-01-25 — End: 2020-01-25
  Administered 2020-01-25: 40 mg via INTRAVENOUS

## 2020-01-25 MED ORDER — FENTANYL CITRATE 50 MCG/ML IJ SOLN *WRAPPED*
INTRAMUSCULAR | Status: DC | PRN
Start: 2020-01-25 — End: 2020-01-25
  Administered 2020-01-25 (×2): 50 ug via INTRAVENOUS

## 2020-01-25 MED ORDER — KETOROLAC TROMETHAMINE 15 MG/ML IJ SOLN *I*
INTRAMUSCULAR | Status: DC | PRN
Start: 2020-01-25 — End: 2020-01-25
  Administered 2020-01-25: 30 mg via INTRAVENOUS

## 2020-01-25 MED ORDER — ONDANSETRON HCL 2 MG/ML IV SOLN *I*
INTRAMUSCULAR | Status: DC | PRN
Start: 2020-01-25 — End: 2020-01-25
  Administered 2020-01-25: 4 mg via INTRAMUSCULAR

## 2020-01-25 MED ORDER — MIDAZOLAM HCL 1 MG/ML IJ SOLN *I* WRAPPED
INTRAMUSCULAR | Status: AC
Start: 2020-01-25 — End: 2020-01-25
  Filled 2020-01-25: qty 2

## 2020-01-25 MED ORDER — PROPOFOL INFUSION 10 MG/ML *I*
INTRAVENOUS | Status: DC | PRN
Start: 2020-01-25 — End: 2020-01-25
  Administered 2020-01-25: 125 ug/kg/min via INTRAVENOUS
  Administered 2020-01-25: 150 ug/kg/min via INTRAVENOUS

## 2020-01-25 MED ORDER — ONDANSETRON HCL 2 MG/ML IV SOLN *I*
1.0000 mg | Freq: Once | INTRAMUSCULAR | Status: AC | PRN
Start: 2020-01-25 — End: 2020-01-25

## 2020-01-25 MED ORDER — CEFAZOLIN 1000 MG IN STERILE WATER 10ML SYRINGE *I*
PREFILLED_SYRINGE | INTRAVENOUS | Status: AC
Start: 2020-01-25 — End: 2020-01-25
  Filled 2020-01-25: qty 20

## 2020-01-25 MED ORDER — ACETAMINOPHEN 325 MG PO TABS *I*
ORAL_TABLET | ORAL | Status: AC
Start: 2020-01-25 — End: 2020-01-25
  Filled 2020-01-25: qty 3

## 2020-01-25 MED ORDER — PROPOFOL 10 MG/ML IV EMUL (INTERMITTENT DOSING) WRAPPED *I*
INTRAVENOUS | Status: DC | PRN
Start: 2020-01-25 — End: 2020-01-25
  Administered 2020-01-25: 20 mg via INTRAVENOUS

## 2020-01-25 MED ORDER — CEFAZOLIN 1000 MG IN STERILE WATER 10ML SYRINGE *I*
2000.0000 mg | PREFILLED_SYRINGE | Freq: Once | INTRAVENOUS | Status: AC
Start: 2020-01-25 — End: 2020-01-25
  Administered 2020-01-25: 2000 mg via INTRAVENOUS

## 2020-01-25 MED ORDER — HYDROMORPHONE HCL PF 1 MG/ML IJ SOLN *WRAPPED*
0.5000 mg | INTRAMUSCULAR | Status: DC | PRN
Start: 2020-01-25 — End: 2020-01-26

## 2020-01-25 MED ORDER — LACTATED RINGERS IV SOLN *I*
20.0000 mL/h | INTRAVENOUS | Status: DC
Start: 2020-01-25 — End: 2020-01-26
  Administered 2020-01-25: 20 mL/h via INTRAVENOUS

## 2020-01-25 MED ORDER — ACETAMINOPHEN 325 MG PO TABS *I*
975.0000 mg | ORAL_TABLET | Freq: Once | ORAL | Status: AC
Start: 2020-01-25 — End: 2020-01-25
  Administered 2020-01-25: 975 mg via ORAL

## 2020-01-25 MED ORDER — LIDOCAINE HCL (PF) 1 % IJ SOLN *I*
0.1000 mL | Freq: Once | INTRAMUSCULAR | Status: AC | PRN
Start: 2020-01-25 — End: 2020-01-25

## 2020-01-25 MED ORDER — PROMETHAZINE HCL 25 MG/ML IJ SOLN *I*
6.2500 mg | INTRAMUSCULAR | Status: DC | PRN
Start: 2020-01-25 — End: 2020-01-26

## 2020-01-25 MED ORDER — FENTANYL CITRATE 50 MCG/ML IJ SOLN *WRAPPED*
INTRAMUSCULAR | Status: AC
Start: 2020-01-25 — End: 2020-01-25
  Filled 2020-01-25: qty 2

## 2020-01-25 MED ORDER — OXYCODONE HCL 5 MG/5ML PO SOLN *I*
10.0000 mg | Freq: Once | ORAL | Status: DC | PRN
Start: 2020-01-25 — End: 2020-01-26

## 2020-01-25 MED ORDER — ACETAMINOPHEN 160 MG PO CHEW WRAPPED *I*
960.0000 mg | CHEWABLE_TABLET | Freq: Once | ORAL | Status: AC
Start: 2020-01-25 — End: 2020-01-25

## 2020-01-25 MED ORDER — DEXAMETHASONE SODIUM PHOSPHATE 4 MG/ML INJ SOLN *WRAPPED*
INTRAMUSCULAR | Status: DC | PRN
Start: 2020-01-25 — End: 2020-01-25
  Administered 2020-01-25: 4 mg via INTRAVENOUS

## 2020-01-25 MED ORDER — OXYCODONE HCL 5 MG PO TABS *I*
5.0000 mg | ORAL_TABLET | Freq: Four times a day (QID) | ORAL | 0 refills | Status: DC | PRN
Start: 2020-01-25 — End: 2020-02-01
  Filled 2020-01-25: qty 15, 4d supply, fill #0

## 2020-01-25 MED ORDER — OXYCODONE HCL 5 MG/5ML PO SOLN *I*
5.0000 mg | Freq: Once | ORAL | Status: DC | PRN
Start: 2020-01-25 — End: 2020-01-26

## 2020-01-25 SURGICAL SUPPLY — 39 items
ADHESIVE DERMABOND GLUE HI VISCOSITY (Dressing)
ADHESIVE SKIN CLOSURE 0.7ML DERMABOND ADVANCED (Dressing) IMPLANT
APPLICATOR CHLORAPREP 26ML ORANGE LARGE (Solution) ×6 IMPLANT
BANDAGE CONFORM GAUZE 1 X 4.1YD STER (Dressing) ×3 IMPLANT
BANDAGE ELAST SLF-CLSR 2 X5.5 LF NONSTER (Dressing) ×3 IMPLANT
BANDAGE SLF ADHER 1IN NONSTER LF (Dressing) IMPLANT
BLADE SAG FINE 4.5 X 25.5 X .4MM (Supply) IMPLANT
CANISTER INST JURGAN PIN BALL (Other) ×1 IMPLANT
DRAPE FLUOROSCAN MINI C-ARM (Drape) ×3 IMPLANT
DRESSING XEROFORM STR 1 IN X8 (Dressing) ×4 IMPLANT
GLOVE BIOGEL PI MICRO IND UNDER SZ 8.0 LF (Glove) ×3 IMPLANT
GLOVE BIOGEL PI ORTHOPRO SZ 8 LF (Glove) ×3 IMPLANT
GLOVE RADIATION REDUCING SZ8.5 (Glove) ×3 IMPLANT
GLOVE SURG BIOGEL PI ULTRATOUCH SZ 6.5 (Glove) IMPLANT
GOWN SIRIUS NONREINFORCED SET IN L (Gown) ×3 IMPLANT
GOWN SIRUS NONREINFORCED SET IN XXL (Gown) IMPLANT
PACK CUSTOM HAMMERT (Pack) ×3 IMPLANT
PACK TOWEL LIGHT BLUE STERILE (Pack) ×3 IMPLANT
PADDING WEBRIL 2IN LF STER (Dressing) ×3 IMPLANT
PADDING WEBRIL 3IN LF STER (Dressing) ×3 IMPLANT
PADDING WEBRIL 4IN LF NONSTER (Dressing) ×3 IMPLANT
PREP CHG DYNA-HEX LIQUID 4PCT 4OZ (Other) ×3 IMPLANT
SLEEVE COMP KNEE HI MED (Supply) IMPLANT
SOL SOD CHL IRRIG 250ML BTL (Solution) ×3 IMPLANT
SOL SOD CHL IRRIG 500ML BTL (Solution) ×3 IMPLANT
SPLINT FINGER ALUM 1/2 IN X9 IN (Dressing) IMPLANT
SPLINT SPECIALIST FAST 3 X 15IN BLUE (Dressing) IMPLANT
SPLINT SPECIALIST FAST 4 X 15IN BLUE (Dressing) IMPLANT
SPONGE SURGIFOAM GELATIN 12-7 (Sponge) IMPLANT
SUTR CHROMIC GUT 4-0 PS-4 18 IN (Suture) ×2 IMPLANT
SUTR ETHILON MONO 4-0 P-3 BLACK (Suture) IMPLANT
SUTR MONOCRYL 4-0 P-3 18IN UNDY (Suture) IMPLANT
SUTR VICRYL ANTIB 2-0 SH 27 UNDY (Suture) IMPLANT
SUTR VICRYL ANTIB 3-0 PS-2 27 UNDYED (Suture) IMPLANT
SUTR VICRYL ANTIB 4-0 P-3 18 UNDYED G (Suture) ×3 IMPLANT
SUTURE NONOCRYL 4-0 18IN ABSORB P-3 3/8 CIR REV CUT UNDYED ANTIBACT (Suture) IMPLANT
TOURNICOT MED FINGER TOURNIQUET (Supply) IMPLANT
TUBING MEDI-VAC NONCONDUC 12FT X 3/16IN (Tubing) IMPLANT
WIRE K DBL TROCAR END SHT SMTH .045X9IN (Implant) ×2 IMPLANT

## 2020-01-25 NOTE — INTERIM OP NOTE (Signed)
Interim Op Note (Surgical Log ID: [id number])       Date of Surgery: 01/25/2020       Surgeons: Surgeon(s) and Role:     * Hammert, Broadus John, MD - Primary     * Kyrstyn Greear, Sylvan Cheese, MD - Resident - Assisting       Pre-op Diagnosis: Pre-Op Diagnosis Codes:     * Open nondisplaced fracture of proximal phalanx of left index finger, initial encounter [S62.641B]       Post-op Diagnosis: Post-Op Diagnosis Codes:     * Open nondisplaced fracture of proximal phalanx of left index finger, initial encounter [S62.641B]       Procedure(s) Performed: Procedure(s) (LRB):  ORIF, INDEX FINGER (Left)  DEBRIDEMENT, OPEN FRACTURE DISLOCATION, INDEX FINGER (Left)  EXPLORATION TENDON/NERVE, HAND (Left)       Anesthesia Type: Monitor Anesthesia Care        Fluid Totals: I/O this shift:  03/15 0700 - 03/15 1459  In: 800 (8.3 mL/kg) [I.V.:800]  Out: 2 (0 mL/kg) [Blood:2]  Net: 798  Weight: 96.2 kg        Estimated Blood Loss: 2 mL       Specimens to Pathology:  * No specimens in log *       Temporary Implants: Wire K Dbl Trocar End Sht Smth .045x9in; Removal Plan: ; (Anticipated Removal Date:02/22/2020);            Packing:                 Patient Condition: good       Findings (Including unexpected complications): No complications     Signed:  Darylene Price, MD  on 01/25/2020 at 10:39 AM

## 2020-01-25 NOTE — Anesthesia Procedure Notes (Signed)
---------------------------------------------------------------------------------------------------------------------------------------    AIRWAY   GENERAL INFORMATION AND STAFF    Patient location during procedure: OR       Date of Procedure: 01/25/2020 9:24 AM  CONDITION PRIOR TO MANIPULATION     Current Airway/Neck Condition:  Normal        For more airway physical exam details, see Anesthesia PreOp Evaluation  AIRWAY METHOD     Patient Position:  Sniffing    Preoxygenated: yes      Induction: IV  Mask Difficulty Assessment:  0 - not attempted    Number of Attempts at Approach:  1    Number of Other Approaches Attempted:  0  FINAL AIRWAY DETAILS    Final Airway Type:  Nasal cannula    Adjunct Airway: oropharyngeal airway (OPA)    OPA Size:     Head position required to avoid obstruction:  Neutral    Insertion Site:  Oral  ----------------------------------------------------------------------------------------------------------------------------------------

## 2020-01-25 NOTE — Discharge Instructions (Addendum)
DISCHARGE INSTRUCTIONS  Dr. Ruben Im  Phone [phone]  ACTIVITY:  Rest today; tomorrow you may resume your usual activities    DIET:   Resume your usual diet.    MEDICATIONS:  Resume your usual medication.  If you are taking prescribed pain medication, you should not drive, operate machinery, power tools, or drink any alcoholic beverages.  Tylenol 325 mg or 500 mg and take with Ibuprofen 400mg  (take together) around the clock for 24 hours.     If able, you may take over the counter Ibuprofen (Motrin, Advil) 2 tabs in between or with doses of prescribed pain medicine but do not take any additional Tylenol if your narcotic contains Tylenol.    DRESSINGS:  Keep your dressing clean and dry until you see Dr. or Robbi Garter unless otherwise instructed.  Use an ice bag over your incision site for 48 hours.  Keep a washcloth between the ice bag and cast/bandage to keep it dry.  (45 minutes on/15 off max).      SPECIAL INSTRUCTIONS:  You may take a bath/shower anytime.  Keep splint/dressing dry.  Driving is up to your discretion, but it is not advisable while taking pain medication.  Specific circumstances should be discussed with Dr. Alfredia Client or Robbi Garter  Physician discharge instruction sheet given to patient.    SURGERY TO A LIMB:  Keep limb elevated on 2-3 pillows above the level of your heart for 48 hours.  Gently move the fingers of your affected limb.     YOU SHOULD CALL YOUR DOCTOR FOR ANY OF THE FOLLOWING:  Fever of 101 or higher.  Foul smelling drainage from incision or cast.  Pain that does not lessen with pain medication as prescribed by Dr. Alfredia Client or Robbi Garter  Persistent nausea or vomiting into the next day.  Bleeding or continuous oozing that saturates the bandage that does not stop after applying pressure to wound for 10 minutes.  Swelling of limb or severe tightness of bandage not relieved with elevation of limb above the level of your heart.  Numbness or tingling lasting longer than 24  hours.  Pale, blue or cold fingers/ nail beds (compared to opposite side).  If you have not urinated within 6 hours after discharge.    You may return to work / school on:   To be determined @ Post op visit    Due to the lasting effects of anesthesia, we recommend you do not make any important decisions for 24 hours.    Follow-up care: Dr. Alfredia Client office [phone] will call you the day after your procedure for an appointment.      In case of an emergency in which you cannot reach your physician, please go directly to the nearest hospital emergency room department.    About your medications from today    [x]  Prescription information provided from onsite pharmacy.   []  Prescription information given to patient and/or patient representative, prescription not filled at onsite pharmacy.      Your last pain medication was given to you at: Tylenol was given around 8:20 am. Toradol (similar to Motrin/Ibuprofen) was given around 10:30 am.    Your next dose of pain medication is due after: Can have Tylenol anytime after 2:20 pm. Can have Motrin/Ibuprofen anytime after 4:30 pm.

## 2020-01-25 NOTE — Anesthesia Postprocedure Evaluation (Signed)
Anesthesia Post-Op Note    Patient: [name]    Procedure(s) Performed:  Procedure Summary  Date:  01/25/2020 Anesthesia Start: 01/25/2020  9:18 AM Anesthesia Stop: 01/25/2020 10:41 AM Room / Location:  [id number] / [id number]   Procedure(s):  ORIF, INDEX FINGER  DEBRIDEMENT, OPEN FRACTURE DISLOCATION, INDEX FINGER  EXPLORATION TENDON/NERVE, HAND Diagnosis:  Open nondisplaced fracture of proximal phalanx of left index finger, initial encounter [S62.641B] Surgeon(s):  Hammert, Cletus Gash, MD  Scarlette Shorts, Senaida Lange, MD Responsible Anesthesia Provider:  Idalia Needle, MD         Recovery Vitals  BP: (!) 120/91 (01/25/2020 11:30 AM)  Heart Rate: 86 (01/25/2020 11:00 AM)  Heart Rate (via Pulse Ox): 61 (01/25/2020 11:30 AM)  Resp: 15 (01/25/2020 11:00 AM)  Temp: 36 C (96.8 F) (01/25/2020 11:30 AM)  SpO2: 100 % (01/25/2020 11:30 AM)  O2 Flow Rate: 2 L/min (01/25/2020 10:38 AM)   0-10 Scale: 0 (01/25/2020 11:30 AM)    Anesthesia type:  MAC  Complications Noted During Procedure or in PACU:  None   Comment:    Patient Location:  PACU  Level of Consciousness:    Awake  Patient Participation:     Able to participate  Temperature Status:    Normothermic  Oxygen Saturation:    Within patient's normal range  Cardiac Status:   Stable  Fluid Status:    Stable  Airway Patency:     Yes  Pulmonary Status:    Baseline  Pain Management:    Adequate analgesia  Nausea and Vomiting:  None    Post Op Assessment:    Tolerated procedure well and complications noted below:Attending Attestation:  All indicated post anesthesia care provided  Comments:          -

## 2020-01-25 NOTE — Anesthesia Case Conclusion (Signed)
CASE CONCLUSION  Emergence  Criteria Used for Airway Removal:  Adequate Tv & RR, acceptable O2 saturation and following commands  Assessment:  Routine  Transport  Directly to: PACU  Airway:  Nasal cannula  Oxygen Delivery:  2 lpm  Position:  Recumbent  Patient Condition on Handoff  Level of Consciousness:  Alert/talking/calm  Patient Condition:  Stable  Handoff Report to:  RN
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
## 2020-01-25 NOTE — H&P (Signed)
UPDATES TO PATIENT'S CONDITION on the DAY OF SURGERY/PROCEDURE    I. Updates to Patient's Condition (to be completed by a provider privileged to complete a H&P, following reassessment of the patient by the provider):    Day of Surgery/Procedure Update:  History  History reviewed and no change    Physical  Physical exam updated and no change            II. Procedure Readiness   I have reviewed the patient's H&P and updated condition. By completing and signing this form, I attest that this patient is ready for surgery/procedure.    III. Attestation   I have reviewed the updated information regarding the patient's condition and it is appropriate to proceed with the planned surgery/procedure.    I have reminded Mr. Prows that COVID-19 is still present in our community. He was advised that [facility] and its affiliates have made deliberate and widespread changes to policies and procedures, consistent with applicable directives, in order to reduce the risk of exposure in our facilities.     I further explained and Mr. Creque understands that given the communicability of the SARS CoV2 coronavirus, there remains a small but real risk of contracting the disease while receiving perioperative care - even with stringent preventive measures in place.   Mr. Isadore understands the potential consequences of COVID-19 disease as it relates to their planned procedure and anticipated postoperative course.      The patient and I have considered and discussed the relative risks and benefits of proceeding with his/her surgery - both in terms of the procedure itself, and also in the context of the ongoing pandemic.  Mr. Minar wishes to proceed with the procedure.     Ruben Im, MD as of 9:17 AM 01/25/2020

## 2020-01-27 ENCOUNTER — Other Ambulatory Visit: Payer: Self-pay | Admitting: Family

## 2020-01-27 DIAGNOSIS — S62641B Nondisplaced fracture of proximal phalanx of left index finger, initial encounter for open fracture: Secondary | ICD-10-CM

## 2020-01-28 ENCOUNTER — Ambulatory Visit: Payer: Medicaid Other | Attending: Orthopedic Surgery | Admitting: Rehabilitative and Restorative Service Providers"

## 2020-01-28 ENCOUNTER — Ambulatory Visit
Admission: RE | Admit: 2020-01-28 | Discharge: 2020-01-28 | Disposition: A | Discharge status: Home / Self Care | Payer: Medicaid Other | Source: Ambulatory Visit | Attending: Family | Admitting: Family

## 2020-01-28 ENCOUNTER — Ambulatory Visit: Payer: Medicaid Other | Admitting: Family

## 2020-01-28 ENCOUNTER — Encounter: Payer: Self-pay | Admitting: Family

## 2020-01-28 ENCOUNTER — Telehealth: Payer: Self-pay | Admitting: Orthopedic Surgery

## 2020-01-28 VITALS — BP 137/83 | HR 65 | Ht 71.0 in | Wt 212.0 lb

## 2020-01-28 DIAGNOSIS — S62641B Nondisplaced fracture of proximal phalanx of left index finger, initial encounter for open fracture: Secondary | ICD-10-CM | POA: Insufficient documentation

## 2020-01-28 DIAGNOSIS — S62611D Displaced fracture of proximal phalanx of left index finger, subsequent encounter for fracture with routine healing: Secondary | ICD-10-CM

## 2020-01-28 MED ORDER — OXYCODONE HCL 5 MG PO CAPS *A*
10.0000 mg | ORAL_CAPSULE | Freq: Four times a day (QID) | ORAL | 0 refills | Status: DC | PRN
Start: 2020-01-28 — End: 2020-02-10

## 2020-01-28 MED ORDER — OXYCODONE HCL 5 MG PO CAPS *A*
10.0000 mg | ORAL_CAPSULE | Freq: Four times a day (QID) | ORAL | 0 refills | Status: DC | PRN
Start: 2020-01-28 — End: 2020-01-28
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
## 2020-01-28 NOTE — Telephone Encounter (Signed)
[facility] is calling for clarification on oxycodone medication sent over today. Please call back Pharmacist, Cristal Deer, at [phone] to clarify. Thank you.

## 2020-02-01 ENCOUNTER — Ambulatory Visit: Payer: Medicaid Other | Admitting: Family Medicine

## 2020-02-01 ENCOUNTER — Other Ambulatory Visit
Admission: RE | Admit: 2020-02-01 | Discharge: 2020-02-01 | Disposition: A | Discharge status: Home / Self Care | Payer: Medicaid Other | Source: Ambulatory Visit | Attending: Family Medicine | Admitting: Family Medicine

## 2020-02-01 ENCOUNTER — Encounter: Payer: Self-pay | Admitting: Family Medicine

## 2020-02-01 VITALS — BP 134/73 | HR 75 | Temp 98.8°F | Ht 72.0 in | Wt 213.2 lb

## 2020-02-01 DIAGNOSIS — J45909 Unspecified asthma, uncomplicated: Secondary | ICD-10-CM

## 2020-02-01 DIAGNOSIS — Z6829 Body mass index (BMI) 29.0-29.9, adult: Secondary | ICD-10-CM

## 2020-02-01 DIAGNOSIS — W3400XA Accidental discharge from unspecified firearms or gun, initial encounter: Secondary | ICD-10-CM

## 2020-02-01 DIAGNOSIS — K219 Gastro-esophageal reflux disease without esophagitis: Secondary | ICD-10-CM

## 2020-02-01 LAB — LIPID PANEL
Chol/HDL Ratio: 3.5
Cholesterol: 183 mg/dL
HDL: 53 mg/dL (ref 40–60)
LDL Calculated: 108 mg/dL
Non HDL Cholesterol: 130 mg/dL
Triglycerides: 112 mg/dL

## 2020-02-01 MED ORDER — OMEPRAZOLE 40 MG PO CPDR *I*
40.0000 mg | DELAYED_RELEASE_CAPSULE | Freq: Every day | ORAL | 5 refills | Status: DC
Start: 2020-02-01 — End: 2021-02-24

## 2020-02-01 MED ORDER — ALBUTEROL SULFATE HFA 108 (90 BASE) MCG/ACT IN AERS *I*
1.0000 | INHALATION_SPRAY | Freq: Four times a day (QID) | RESPIRATORY_TRACT | 2 refills | Status: AC | PRN
Start: 2020-02-01 — End: 2020-07-30

## 2020-02-01 MED ORDER — ALUM & MAG HYDROXIDE-SIMETH 200-200-20 MG/5ML PO SUSP *I*
10.0000 mL | Freq: Four times a day (QID) | ORAL | 5 refills | Status: DC | PRN
Start: 2020-02-01 — End: 2021-02-24
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
## 2020-02-02 ENCOUNTER — Telehealth: Payer: Self-pay

## 2020-02-02 ENCOUNTER — Ambulatory Visit: Payer: Medicaid Other | Admitting: Rehabilitative and Restorative Service Providers"

## 2020-02-02 LAB — HEMOGLOBIN A1C: Hemoglobin A1C: 5.9 % — ABNORMAL HIGH

## 2020-02-02 LAB — HIV 1&2 ANTIGEN/ANTIBODY: HIV 1&2 ANTIGEN/ANTIBODY: NONREACTIVE

## 2020-02-02 LAB — HEPATITIS C ANTIBODY: Hep C Ab: NEGATIVE
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
## 2020-02-02 NOTE — Telephone Encounter (Addendum)
Called [phone] and spoke with patient about scheduling an appointment per provider. Patient agreed to visit and appointment was scheduled.  ----- Message from Aundria Rud, MD sent at 02/02/2020  3:44 PM EDT -----  Asympto screening done, suggestive of pre-DM. Please make appt to discuss lab results/pre-diabetes with patient in the next few weeks. Thanks

## 2020-02-08 ENCOUNTER — Ambulatory Visit: Payer: Medicaid Other | Admitting: Rehabilitative and Restorative Service Providers"

## 2020-02-08 ENCOUNTER — Ambulatory Visit: Payer: Medicaid Other | Admitting: Family

## 2020-02-10 ENCOUNTER — Telehealth: Payer: Self-pay | Admitting: Orthopedic Surgery

## 2020-02-10 ENCOUNTER — Ambulatory Visit: Payer: Medicaid Other | Admitting: Orthopedic Surgery

## 2020-02-10 ENCOUNTER — Ambulatory Visit: Payer: Medicaid Other | Admitting: Rehabilitative and Restorative Service Providers"

## 2020-02-10 ENCOUNTER — Encounter: Payer: Self-pay | Admitting: Orthopedic Surgery

## 2020-02-10 VITALS — BP 130/77 | HR 82 | Ht 72.0 in | Wt 213.0 lb

## 2020-02-10 DIAGNOSIS — S62641B Nondisplaced fracture of proximal phalanx of left index finger, initial encounter for open fracture: Secondary | ICD-10-CM

## 2020-02-10 MED ORDER — OXYCODONE HCL 5 MG PO TABS *I*
5.0000 mg | ORAL_TABLET | Freq: Four times a day (QID) | ORAL | 0 refills | Status: DC | PRN
Start: 2020-02-10 — End: 2020-05-02

## 2020-02-10 MED ORDER — OXYCODONE HCL 5 MG PO CAPS *A*
5.0000 mg | ORAL_CAPSULE | Freq: Four times a day (QID) | ORAL | 0 refills | Status: DC | PRN
Start: 2020-02-10 — End: 2020-02-10

## 2020-02-10 NOTE — Telephone Encounter (Signed)
Spoke to [employer] and medication had been switched from capsules to tablets and is ready for patient to pick up.

## 2020-02-10 NOTE — Telephone Encounter (Signed)
Pharmacy is calling to see if patient's oxycodone 5 mg capsules can be switched to tablets. They can be reached at [phone]. Thank you.
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
## 2020-02-10 NOTE — Progress Notes (Signed)
[facility] Orthopedic Hand / Upper Extremity Rehabilitation Treatment Note    Todays Date: 02/10/2020    Name: [name]  DOB: [birth year]  Referring Physician: Ruben Im, MD  Diagnosis:   1. S/p R index finger I&D, pinning of proximal phalanx fx 01/25/20         Visit #: 2    Subjective:  Patient reports minimal change in symptoms. He reports diligence with his HEP for arom       Objective:      AROM PROM     Right Left Right Left   THUMB MP Extension/Flexion        IP Extension/Flexion        Radial Abduction        Palmar Abduction       INDEX MCP Extension/Flexion        PIP Extension/Flexion <10/44       DIP Extension/Flexion /16      LONG MCP Extension/Flexion        PIP Extension/Flexion <10/48       DIP Extension/Flexion       RING MCP Extension/Flexion        PIP Extension/Flexion        DIP Extension/Flexion       LITTLE MCP Extension/Flexion        PIP Extension/Flexion        DIP Extension/Flexion            Treatment:  Moist heat    Assessment:   Patient shows improved AROM as measured today. Moderate-significant swelling remains. He has good carryover with his HEP exercises and is progressing well without concern otherwise.        Plan of Care: Progress as indicated.     Thank you for referring this patient to [facility]opa[facility] - Hand and Upper Extremity Rehabilitation    Tyson Alias, DPT, CHT    MH    Isolated DIP and PIP flexion 2x10 ea   Gentle active composite finger flexion and extension with buddy strap on x10   Patient was instructed for hourly compliance of his HEP routine at home

## 2020-02-10 NOTE — Addendum Note (Signed)
Addended by: Garner Nash on: 02/10/2020 01:24 PM     Modules accepted: Orders

## 2020-02-15 ENCOUNTER — Ambulatory Visit: Payer: Medicaid Other | Admitting: Family Medicine

## 2020-02-15 ENCOUNTER — Encounter: Payer: Self-pay | Admitting: Family Medicine

## 2020-02-15 VITALS — BP 119/71 | HR 86 | Temp 98.0°F | Ht 72.01 in | Wt 218.6 lb

## 2020-02-15 DIAGNOSIS — Z7185 Encounter for immunization safety counseling: Secondary | ICD-10-CM

## 2020-02-15 DIAGNOSIS — K219 Gastro-esophageal reflux disease without esophagitis: Secondary | ICD-10-CM

## 2020-02-15 DIAGNOSIS — Z7189 Other specified counseling: Secondary | ICD-10-CM

## 2020-02-15 DIAGNOSIS — R7303 Prediabetes: Secondary | ICD-10-CM
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
## 2020-02-15 NOTE — Patient Instructions (Signed)
COVID Vaccination:     Website: https://covid19vaccine.[email or web address].gl -> "CHECK ELIGIBILITY"   website updated 10x daily, check often for appointments!    Phone: [place] COVID-19 Hotline: 1-833-[place]-4-VAX ((276)421-6849)    Eligibility: Anyone 16 years and up   Pfizer: 16y+    Moderna: 18y+   [employer]: 18y+    Site #1: [facility] in [place]: [address]    Site #2: Former [employer]: [address]    [place]: The COVID vaccine will have limited doses given through our office, but it will also be given through two centralized locations run by [facility] - one on the medical center campus and one in the downtown area. [facility] will be contacting all eligible adults randomly to offer vaccination appointments as the vaccine becomes available.    Local Pharmacies:    [facility]: [email or web address]   o Phone: [phone], Monday - Friday, from 8:30 a.m. to 5 p.m.    [facility]: [email or web address]   [facility]: https://www.rite[facility].com/covid-vaccine-apt   [facility]: https://www.[facility]/findcare/vaccination/covid-19/location-screening   o Phone: 1-800-[facility]  (731-646-0214)   [facility]: [email or web address]    FAQ   "I just got my shingles shot, can I get the COVID shot now?"  o It is recommended to have a minimum of 2 weeks between any other vaccine (e.g. Shingrix/Zostavax, flu, etc.) and the COVID-19 vaccine.    "How much does it cost?"   o The COVID-19 vaccine is FREE for everyone, but you will need your insurance card to sign up.   "Will I get side effects?"  o Up to 50% of people experience sore arm, fatigue, and/or chills. These are more common with the second dose of vaccine.    "Can I take medicine to help with the side effects?"  o It is discouraged to take anything before the vaccine, so that your body can have a full immune response. However, it is completely  fine and safe to take tylenol or ibuprofen after the vaccine if you do develop any symptoms.    More Resources:   o General Info: MediaLives.[email or web address]  o Pregnancy: [email or web address]      Updated 02/16/2020

## 2020-02-16 ENCOUNTER — Other Ambulatory Visit: Payer: Self-pay | Admitting: Family Medicine

## 2020-02-16 DIAGNOSIS — R7303 Prediabetes: Secondary | ICD-10-CM

## 2020-02-17 ENCOUNTER — Ambulatory Visit: Payer: Medicaid Other | Admitting: Rehabilitative and Restorative Service Providers"

## 2020-02-21 ENCOUNTER — Encounter: Payer: Self-pay | Admitting: Orthopedic Surgery

## 2020-02-22 ENCOUNTER — Encounter: Payer: Self-pay | Admitting: Family

## 2020-02-22 ENCOUNTER — Ambulatory Visit
Admission: RE | Admit: 2020-02-22 | Discharge: 2020-02-22 | Disposition: A | Discharge status: Home / Self Care | Payer: Medicaid Other | Source: Ambulatory Visit | Attending: Orthopedic Surgery | Admitting: Orthopedic Surgery

## 2020-02-22 ENCOUNTER — Other Ambulatory Visit: Payer: Self-pay | Admitting: Family

## 2020-02-22 ENCOUNTER — Ambulatory Visit: Payer: Medicaid Other | Admitting: Family

## 2020-02-22 ENCOUNTER — Ambulatory Visit: Payer: Medicaid Other | Attending: Family | Admitting: Rehabilitative and Restorative Service Providers"

## 2020-02-22 VITALS — BP 144/74 | HR 79 | Ht 72.0 in | Wt 218.0 lb

## 2020-02-22 DIAGNOSIS — S62641B Nondisplaced fracture of proximal phalanx of left index finger, initial encounter for open fracture: Secondary | ICD-10-CM | POA: Insufficient documentation

## 2020-02-22 DIAGNOSIS — S62611D Displaced fracture of proximal phalanx of left index finger, subsequent encounter for fracture with routine healing: Secondary | ICD-10-CM | POA: Insufficient documentation

## 2020-02-22 MED ORDER — OXYCODONE HCL 5 MG PO TABS *I*
5.0000 mg | ORAL_TABLET | Freq: Four times a day (QID) | ORAL | 0 refills | Status: DC | PRN
Start: 2020-02-22 — End: 2020-05-02
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
## 2020-02-24 ENCOUNTER — Ambulatory Visit: Payer: Medicaid Other | Admitting: Family

## 2020-02-29 ENCOUNTER — Ambulatory Visit: Payer: Medicaid Other | Admitting: Rehabilitative and Restorative Service Providers"

## 2020-03-01 ENCOUNTER — Telehealth: Payer: Self-pay

## 2020-03-01 ENCOUNTER — Other Ambulatory Visit: Payer: Self-pay | Admitting: Family

## 2020-03-01 NOTE — Telephone Encounter (Signed)
called to reschedule missed appointment, left access number for patient to call back

## 2020-03-08 ENCOUNTER — Ambulatory Visit: Payer: Medicaid Other | Admitting: Orthopedic Surgery

## 2020-03-08 ENCOUNTER — Ambulatory Visit
Admission: RE | Admit: 2020-03-08 | Discharge: 2020-03-08 | Disposition: A | Discharge status: Home / Self Care | Payer: Medicaid Other | Source: Ambulatory Visit | Attending: Family | Admitting: Family

## 2020-03-08 ENCOUNTER — Encounter: Payer: Self-pay | Admitting: Orthopedic Surgery

## 2020-03-08 VITALS — BP 126/74 | Ht 71.0 in | Wt 210.0 lb

## 2020-03-08 DIAGNOSIS — S62641D Nondisplaced fracture of proximal phalanx of left index finger, subsequent encounter for fracture with routine healing: Secondary | ICD-10-CM

## 2020-03-08 DIAGNOSIS — S62641B Nondisplaced fracture of proximal phalanx of left index finger, initial encounter for open fracture: Secondary | ICD-10-CM | POA: Insufficient documentation
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
## 2020-03-18 ENCOUNTER — Ambulatory Visit: Payer: Medicaid Other | Admitting: Rehabilitative and Restorative Service Providers"

## 2020-03-21 ENCOUNTER — Telehealth: Payer: Self-pay

## 2020-03-21 NOTE — Telephone Encounter (Signed)
CALLED TO RESCHEDULE MISSED APPOINTMENT. LEFT ACCESS NUMBER TO CALL BACK.

## 2020-03-22 ENCOUNTER — Ambulatory Visit: Payer: Medicaid Other | Admitting: Orthopedic Surgery

## 2020-03-23 ENCOUNTER — Encounter: Payer: Self-pay | Admitting: Family

## 2020-03-23 ENCOUNTER — Ambulatory Visit: Payer: Medicaid Other | Admitting: Family

## 2020-03-23 VITALS — BP 125/78 | Ht 71.0 in | Wt 210.0 lb

## 2020-03-23 DIAGNOSIS — S62641D Nondisplaced fracture of proximal phalanx of left index finger, subsequent encounter for fracture with routine healing: Secondary | ICD-10-CM
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
## 2020-03-30 ENCOUNTER — Emergency Department: Payer: Medicaid Other

## 2020-03-30 ENCOUNTER — Other Ambulatory Visit: Payer: Self-pay

## 2020-03-30 ENCOUNTER — Encounter: Payer: Self-pay | Admitting: Student in an Organized Health Care Education/Training Program

## 2020-03-30 ENCOUNTER — Emergency Department
Admission: EM | Admit: 2020-03-30 | Discharge: 2020-03-30 | Disposition: A | Discharge status: Home / Self Care | Payer: Medicaid Other | Source: Ambulatory Visit | Attending: Emergency Medicine | Admitting: Emergency Medicine

## 2020-03-30 DIAGNOSIS — Y9389 Activity, other specified: Secondary | ICD-10-CM | POA: Insufficient documentation

## 2020-03-30 DIAGNOSIS — M25461 Effusion, right knee: Secondary | ICD-10-CM

## 2020-03-30 DIAGNOSIS — M25561 Pain in right knee: Secondary | ICD-10-CM

## 2020-03-30 DIAGNOSIS — Y9289 Other specified places as the place of occurrence of the external cause: Secondary | ICD-10-CM | POA: Insufficient documentation

## 2020-03-30 DIAGNOSIS — S8991XA Unspecified injury of right lower leg, initial encounter: Secondary | ICD-10-CM | POA: Insufficient documentation

## 2020-03-30 DIAGNOSIS — W109XXA Fall (on) (from) unspecified stairs and steps, initial encounter: Secondary | ICD-10-CM | POA: Insufficient documentation

## 2020-03-30 DIAGNOSIS — Y998 Other external cause status: Secondary | ICD-10-CM | POA: Insufficient documentation

## 2020-03-30 MED ORDER — ACETAMINOPHEN 500 MG PO TABS *I*
1000.0000 mg | ORAL_TABLET | Freq: Four times a day (QID) | ORAL | 0 refills | Status: AC | PRN
Start: 2020-03-30 — End: 2020-04-29
  Filled 2020-03-30: qty 30, 4d supply, fill #0

## 2020-03-30 MED ORDER — IBUPROFEN 600 MG PO TABS *I*
600.0000 mg | ORAL_TABLET | Freq: Three times a day (TID) | ORAL | 0 refills | Status: DC | PRN
Start: 2020-03-30 — End: 2021-02-24
  Filled 2020-03-30: qty 90, 30d supply, fill #0

## 2020-03-30 MED ORDER — ACETAMINOPHEN 500 MG PO TABS *I*
1000.0000 mg | ORAL_TABLET | Freq: Four times a day (QID) | ORAL | 0 refills | Status: DC | PRN
Start: 2020-03-30 — End: 2020-03-30

## 2020-03-30 MED ORDER — IBUPROFEN 600 MG PO TABS *I*
600.0000 mg | ORAL_TABLET | Freq: Three times a day (TID) | ORAL | 0 refills | Status: DC | PRN
Start: 2020-03-30 — End: 2020-03-30

## 2020-03-30 NOTE — ED Notes (Signed)
Patient given discharge instructions. Follow up and medications reviewed. Patient ambulatory at departure, when asked how he would be getting home he stated "I'll walk" and refused further assistance from Clinical research associate. Patient dressed appropriately, alert and oriented and stable for discharge.

## 2020-03-30 NOTE — ED Triage Notes (Signed)
Pt states he fell down the stairs, c/o pain to R knee. Limited ROM. Denies other injury from fall. States unable to bear weight.        Triage Note   Rachel Bo, RN
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
## 2020-03-30 NOTE — Discharge Instructions (Addendum)
You were seen at [facility] Emergency Department for evaluation of knee pain. The x-rays did not show any broken bones. You may have injured one of the tendons in your knee. You should use the crutches provided and follow up with the orthopedic doctors. You can schedule by calling [phone]. You can also use ice and keep the knee elevated. Additionally, we have sent scripts for tylenol and ibuprofen to the pharmacy for pain control.     Please see your primary care physician or return to the Emergency Department if you develop any of the following signs or symptoms:  -worsening knee pain or swelling, changes in sensation or color of your leg  -Any other concerning symptoms.

## 2020-03-30 NOTE — ED Notes (Signed)
WAITING ROOM CHECK    Patient visualized in waiting room. Patient appears in no acute distress. Patient's chart reviewed and most recent vital signs reviewed. Will continue to monitor.

## 2020-04-02 ENCOUNTER — Encounter: Payer: Self-pay | Admitting: Gastroenterology

## 2020-04-04 ENCOUNTER — Ambulatory Visit: Payer: Medicaid Other | Attending: Orthopedic Surgery | Admitting: Rehabilitative and Restorative Service Providers"

## 2020-04-04 DIAGNOSIS — S62641B Nondisplaced fracture of proximal phalanx of left index finger, initial encounter for open fracture: Secondary | ICD-10-CM | POA: Insufficient documentation
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
## 2020-04-07 ENCOUNTER — Ambulatory Visit: Payer: Medicaid Other | Admitting: Orthopedic Surgery

## 2020-04-15 ENCOUNTER — Ambulatory Visit: Payer: Medicaid Other | Admitting: Orthopedic Surgery

## 2020-04-15 ENCOUNTER — Ambulatory Visit
Admission: RE | Admit: 2020-04-15 | Discharge: 2020-04-15 | Disposition: A | Discharge status: Home / Self Care | Payer: Medicaid Other | Source: Ambulatory Visit | Attending: Family | Admitting: Family

## 2020-04-15 ENCOUNTER — Encounter: Payer: Self-pay | Admitting: Orthopedic Surgery

## 2020-04-15 ENCOUNTER — Other Ambulatory Visit: Payer: Self-pay | Admitting: Gastroenterology

## 2020-04-15 VITALS — BP 139/81 | HR 71 | Ht 71.0 in | Wt 215.0 lb

## 2020-04-15 DIAGNOSIS — S62641P Nondisplaced fracture of proximal phalanx of left index finger, subsequent encounter for fracture with malunion: Secondary | ICD-10-CM | POA: Insufficient documentation

## 2020-04-15 DIAGNOSIS — S62641D Nondisplaced fracture of proximal phalanx of left index finger, subsequent encounter for fracture with routine healing: Secondary | ICD-10-CM
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
## 2020-04-19 ENCOUNTER — Ambulatory Visit: Payer: Medicaid Other | Admitting: Rehabilitative and Restorative Service Providers"

## 2020-04-29 ENCOUNTER — Encounter: Payer: Self-pay | Admitting: Family Medicine

## 2020-04-29 ENCOUNTER — Encounter: Payer: Self-pay | Admitting: Gastroenterology

## 2020-04-29 NOTE — Telephone Encounter (Signed)
Phone call and spoke with patient.  Reviewed provider message.  Patient scheduled for Monday morning on [address].  Patient aware to bring in MRI report.

## 2020-04-29 NOTE — Telephone Encounter (Signed)
Please advise 

## 2020-05-02 ENCOUNTER — Ambulatory Visit: Payer: Medicaid Other | Admitting: Family Medicine

## 2020-05-02 ENCOUNTER — Encounter: Payer: Self-pay | Admitting: Family Medicine

## 2020-05-02 ENCOUNTER — Ambulatory Visit: Payer: Medicaid Other | Attending: Orthopedic Surgery | Admitting: Rehabilitative and Restorative Service Providers"

## 2020-05-02 VITALS — BP 133/87 | HR 81 | Temp 97.3°F | Ht 71.0 in | Wt 205.0 lb

## 2020-05-02 DIAGNOSIS — S82201S Unspecified fracture of shaft of right tibia, sequela: Secondary | ICD-10-CM

## 2020-05-02 DIAGNOSIS — S62641B Nondisplaced fracture of proximal phalanx of left index finger, initial encounter for open fracture: Secondary | ICD-10-CM | POA: Insufficient documentation

## 2020-05-02 DIAGNOSIS — S83411S Sprain of medial collateral ligament of right knee, sequela: Secondary | ICD-10-CM | POA: Insufficient documentation

## 2020-05-02 DIAGNOSIS — M25561 Pain in right knee: Secondary | ICD-10-CM

## 2020-05-02 DIAGNOSIS — S82401S Unspecified fracture of shaft of right fibula, sequela: Secondary | ICD-10-CM

## 2020-05-02 MED ORDER — GABAPENTIN 100 MG PO CAPSULE *I*
200.0000 mg | ORAL_CAPSULE | Freq: Every evening | ORAL | 1 refills | Status: AC
Start: 2020-05-02 — End: 2020-10-29
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
## 2020-05-02 NOTE — Progress Notes (Signed)
[facility] FAMILY MEDICINE: Follow up visit    CC: Kabir Brannock is a 34 y.o. male who presents, for Knee Pain      HPI      Knee Pain  Patient complains of right knee pain. This is evaluated as a personal injury had fallen down the stair and was seen in ED on 03/30/2020. The pain is located patellar.  He describes the symptoms as numbing, shooting, throbbing and tingling. Symptoms improve with rest. The symptoms are worse with activity, kneeling, sitting for prolonged periods of time. The knee has given out or felt unstable. The patient cannot bend and straighten the knee fully. Treatment to date has been ice, NSAID's, knee sleeve/brace, therapy, crutches, with some relief.Taking ibuprofen 600 mg TID but has been taking more,  pain has been worse at night which is interrupting his sleep. Recent fall last Saturday and saw ortho on 04/28/20, pain is about 7/10 last ibuprofen 0500. Sharp, throbbing pain, can't bend or straightening, numbing and tingling to the top and back of knee. Mostly throbbing pain and pain at night when he moves in an awkward position during his sleep.  Was seeing ortho at [facility] for finger injury and referred to [facility] due to scheduling/timing for right knee pain post ED visit.     Reviewed MRI Right Knee , surgery consult on 6/[address] location           Functionality:  Right knee brace, and no weight bearing to right knee     Recent Review Flowsheet Data     PHQ Scores 02/01/2020    PHQ Calculated Score 0          Allergies   Allergen Reactions    Environmental Allergies Rhinitis        Current Outpatient Medications   Medication Sig    ibuprofen (ADVIL,MOTRIN) 600 MG tablet Take 1 tablet (600 mg total) by mouth 3 times daily as needed for Pain    oxyCODONE (ROXICODONE) 5 MG immediate release tablet Take 1 tablet (5 mg total) by mouth every 6 hours as needed for Pain Max daily dose: 20 mg    oxyCODONE (ROXICODONE) 5 MG  immediate release tablet Take 1 tablet (5 mg total) by mouth every 6 hours as needed for Pain Max daily dose: 20 mg    omeprazole (PRILOSEC) 40 MG capsule Take 1 capsule (40 mg total) by mouth daily    aluminum & magnesium hydroxide w/simethicone (MAALOX ADVANCED REGULAR) 200-200-20 MG/5ML susp Take 10 mLs by mouth 4 times daily as needed for Indigestion    albuterol HFA (PROVENTIL, VENTOLIN, PROAIR HFA) 108 (90 Base) MCG/ACT inhaler Inhale 1-2 puffs into the lungs every 6 hours as needed for Wheezing Shake well before each use.    gabapentin (NEURONTIN) 100MG  capsule Take 1 capsule (100 mg total) by mouth 3 times daily       No past medical history on file.    Past Surgical History:   Procedure Laterality Date    PR DEBRIDE ASSOC OPEN FX/DISLO SKIN/MUS/BONE Left 01/25/2020    Procedure: DEBRIDEMENT, OPEN FRACTURE DISLOCATION, INDEX FINGER;  Surgeon: 01/27/2020, MD;  Location: [facility];  Service: Orthopedics    PR OPEN TX PHALANGEAL SHAFT FRACTURE PROX/MIDDLE EA Left 01/25/2020    Procedure: ORIF, INDEX FINGER;  Surgeon: 01/27/2020, MD;  Location: [facility];  Service: Orthopedics    PR REVISE/REPAIR FINGER/TOE NERVE Left 01/25/2020    Procedure: EXPLORATION TENDON/NERVE, HAND;  Surgeon:  Gayleen Orem, MD;  Location: [facility];  Service: Orthopedics       Review of Systems   Constitutional: Negative.    Respiratory: Negative.    Cardiovascular: Negative.    Musculoskeletal: Positive for joint pain and myalgias. Negative for falls.   Skin: Negative.    -See HPI for pertinent positives    Objective:  BP 133/87 (BP Location: Right arm, Patient Position: Sitting, Cuff Size: large adult)    Pulse 81    Temp 36.3 C (97.3 F) (Temporal)    Ht 1.803 m (5\' 11" ) Comment: trans   Wt 93 kg (205 lb) Comment: act   BMI 28.59 kg/m   Physical Exam  Constitutional:       Appearance: Normal appearance.   Cardiovascular:      Rate and Rhythm: Normal rate and regular rhythm.   Pulmonary:      Effort: Pulmonary  effort is normal.   Musculoskeletal:         General: Swelling, tenderness and signs of injury present.      Cervical back: Normal range of motion.      Right lower leg: No edema.      Left lower leg: No edema.   Skin:     Findings: No bruising.   Neurological:      Mental Status: He is alert.       Assessment/Plan:   Tear of MCL (medial collateral ligament) of knee, right, sequela  - schedule for surgery consult 05/05/20   - advised to keep upcoming surgery consult   - no weight bearing, brace on except for bathing/showering, ice 4x per day , nsaids every 6-8 hours,   -Will trial gabapentin at night ( precautions and side effects discussed)   - physical therapy rx and disability letter scanned into media    Tibia/fibula fracture, right, sequela  As above     Pain in right knee  -     gabapentin (NEURONTIN) 100MG  capsule; Take 2 capsules (200 mg total) by mouth nightly X 3 night and increase to 3 capsule ( 300 mg) if clinically indicated      Follow up in 2-3 weeks w/ CCP for right knee injury     Myschart: active     Patient's allergies medications, medical, surgical, social, and  family were reviewed and/or updated as appropriate.    Patient verbalized understanding of information presented,and confirmed agreement with current plan of care. Reviewed risk, benefits prescribed/refilled medications.      Fabio Neighbors , FNP-BC    [place]  [address]

## 2020-05-05 ENCOUNTER — Encounter: Payer: Self-pay | Admitting: Gastroenterology

## 2020-05-10 ENCOUNTER — Ambulatory Visit: Payer: Medicaid Other | Admitting: Rehabilitative and Restorative Service Providers"

## 2020-05-17 ENCOUNTER — Ambulatory Visit: Payer: Medicaid Other | Admitting: Orthopedic Surgery

## 2020-05-17 ENCOUNTER — Ambulatory Visit: Payer: Medicaid Other | Admitting: Rehabilitative and Restorative Service Providers"

## 2020-05-18 ENCOUNTER — Encounter: Payer: Self-pay | Admitting: Gastroenterology

## 2020-05-25 ENCOUNTER — Ambulatory Visit: Payer: Medicaid Other | Admitting: Rehabilitative and Restorative Service Providers"

## 2020-05-30 ENCOUNTER — Ambulatory Visit: Payer: Medicaid Other | Admitting: Rehabilitative and Restorative Service Providers"

## 2020-06-01 ENCOUNTER — Ambulatory Visit: Payer: Medicaid Other | Attending: Orthopedic Surgery | Admitting: Rehabilitative and Restorative Service Providers"

## 2020-06-01 DIAGNOSIS — S62641B Nondisplaced fracture of proximal phalanx of left index finger, initial encounter for open fracture: Secondary | ICD-10-CM

## 2020-06-01 NOTE — Progress Notes (Signed)
[facility] Orthopedic Hand / Upper Extremity Rehabilitation Treatment Note    Todays Date: 06/01/2020    Name: [name]  DOB: 01/11/1986  Referring Physician: Ruben Im, MD  Diagnosis:   1. S/p R index finger I&D, pinning of proximal phalanx fx 01/25/20         Visit #: 6    Subjective:  Patient reports minimal improvement in rom or strength. He reports difficulty with making time to perform his ROM exercises for his finger at home.      Objective:      AROM PROM     Right Left Right Left   THUMB MP Extension/Flexion        IP Extension/Flexion        Radial Abduction        Palmar Abduction       INDEX MCP Extension/Flexion 0/68  /70     PIP Extension/Flexion 19/55  5/66     DIP Extension/Flexion 10/38  0/56    LONG MCP Extension/Flexion        PIP Extension/Flexion <10/48       DIP Extension/Flexion       RING MCP Extension/Flexion        PIP Extension/Flexion        DIP Extension/Flexion       LITTLE MCP Extension/Flexion        PIP Extension/Flexion        DIP Extension/Flexion            Treatment:  Moist heat    Assessment:   Patient has made some improvement in active flexion of his pip, roughly 10-15 degrees over the past two months. He has only been able to attend 3 visits over that time.        Plan of Care: Progress as indicated in 1 week.     Thank you for referring this patient to [facility] Orthopaedics - Hand and Upper Extremity Rehabilitation    Tyson Alias, DPT, CHT    MH    [place]    Isolated DIP and PIP flexion 2x10 ea   PROM IP flexion 3x30"   CPM finger flexion    LLPS with coban    NMES finger flexion Next visit   Reverse curls with strap x10   IASTM to volar finger

## 2020-06-08 ENCOUNTER — Ambulatory Visit: Payer: Medicaid Other | Admitting: Rehabilitative and Restorative Service Providers"

## 2020-06-08 DIAGNOSIS — S62641B Nondisplaced fracture of proximal phalanx of left index finger, initial encounter for open fracture: Secondary | ICD-10-CM

## 2020-06-15 NOTE — Progress Notes (Signed)
[facility] Orthopedic Hand / Upper Extremity Rehabilitation Treatment Note    Todays Date: 06/15/2020    Name: [name]  DOB: [birth year]  Referring Physician: Ruben Im, MD  Diagnosis:   1. S/p R index finger I&D, pinning of proximal phalanx fx 01/25/20         Visit #: 7    Subjective:  Patient reports minimal improvement in rom or strength. He reports difficulty with making time to perform his ROM exercises for his finger at home.      Objective:      AROM PROM     Right Left Right Left   THUMB MP Extension/Flexion        IP Extension/Flexion        Radial Abduction        Palmar Abduction       INDEX MCP Extension/Flexion 0/68  /70     PIP Extension/Flexion 19/55  5/66     DIP Extension/Flexion 10/38  0/56    LONG MCP Extension/Flexion        PIP Extension/Flexion <10/48       DIP Extension/Flexion       RING MCP Extension/Flexion        PIP Extension/Flexion        DIP Extension/Flexion       LITTLE MCP Extension/Flexion        PIP Extension/Flexion        DIP Extension/Flexion            Treatment:  Moist heat    Assessment:   Patient has made some improvement in active flexion of his pip, roughly 10-15 degrees over the past two months. He has only been able to attend 3 visits over that time.        Plan of Care: Progress as indicated in 1 week.     Thank you for referring this patient to [facility] Orthopaedics - Hand and Upper Extremity Rehabilitation    Tyson Alias, DPT, CHT    MH    [place]    Isolated DIP and PIP flexion 2x10 ea   PROM IP flexion 3x30"   CPM finger flexion    LLPS with coban    NMES finger flexion Next visit   Reverse curls with strap x10   IASTM to volar finger

## 2020-06-17 ENCOUNTER — Ambulatory Visit: Payer: Medicaid Other | Admitting: Rehabilitative and Restorative Service Providers"

## 2020-06-23 ENCOUNTER — Ambulatory Visit: Payer: Medicaid Other | Admitting: Rehabilitative and Restorative Service Providers"

## 2020-06-24 ENCOUNTER — Ambulatory Visit: Payer: Medicaid Other | Admitting: Orthopedic Surgery

## 2020-06-24 ENCOUNTER — Encounter: Payer: Self-pay | Admitting: Orthopedic Surgery

## 2020-06-24 VITALS — BP 123/73 | Ht 71.0 in | Wt 210.0 lb

## 2020-06-24 DIAGNOSIS — S62641P Nondisplaced fracture of proximal phalanx of left index finger, subsequent encounter for fracture with malunion: Secondary | ICD-10-CM
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
## 2020-06-24 NOTE — Invasive Procedure Plan of Care (Addendum)
[facility]  [facility]  [facility]    CONSENT FOR MEDICAL  OR SURGICAL PROCEDURE                            Patient Name: [name]  [facility] [address]                                                              DOB: 04/24/1986         Please read this form or have someone read it to you.   It's important to understand all parts of this form. If something isn't clear, ask [place] to explain.   When you sign it, that means you understand the form and give [place] permission to do this surgery or procedure.     I agree for Ruben Im, MD , and Residents/fellow along with any assistants* they may choose, to treat the following condition(s): Finger deformity left index   By doing this surgery or procedure on me: Correct the defmority left index finger with bone graft from left wrist   This is also known as: Correction of Left index finger proximal phalanx malunion with possible left distal radius bone graft   Laterality: Left     *if you'd like a list of the assistants, please ask. We can give that to you.    1. The care provider has explained my condition to me. They have told me how the procedure can help me. They have told me about other ways of treating my condition. I understand the care provider cannot guarantee the result of the procedure. If I don't have this procedure, my other choices are: No surgery    2. The care provider has told me the risks (problems that can happen) of the procedure. I understand there may be unwanted results. The risks that are related to this procedure include: Bleeding, infection, damage to muscles, tendons, nerves, ligaments,, persistent stiffness, need for hardware removal after healing, need for repeat surgeries    3. I understand that during the procedure, my care provider may find a condition that we didn't know about before the treatment started. Therefore, I agree that my care provider can perform any other treatment which they think is necessary  and available.    4. I understand the care provider may remove tissue, body parts, or materials during this procedure. These materials may be used to help with my diagnosis and treatment. They might also be used for teaching purposes or for research studies that I have separately agreed to participate in. Otherwise they will be disposed of as required by law.    5. My care provider might want a representative from a medical device company to be there during my procedure. I understand that person works for:  [employer]        The ways they might help my care provider during my procedure include:        Providing information and support to operating room staff regarding the device    6. Here are my decisions about receiving blood, blood products, or tissues. I understand my decisions cover the time before, during and after my procedure, my treatment, and my time in the hospital. After my  procedure, if my condition changes a lot, my care provider will talk with me again about receiving blood or blood products. At that time, my care provider might need me to review and sign another consent form, about getting or refusing blood.    I understand that the blood is from the community blood supply. Volunteers donated the blood, the volunteers were screened for health problems. The blood was examined with very sensitive and accurate tests to look for hepatitis, HIV/AIDS, and other diseases. Before I receive blood, it is tested again to make sure it is the correct type.    My chances of getting a sickness from blood products are small. But no transfusion is 100% safe. I understand that my care provider feels the good I will receive from the blood is greater than the chances of something going wrong. My care provider has answered my questions about blood products.      My decision  about blood or  blood products   Not applicable.        My decision   about tissue  Implants     Not applicable.          I understand  this  form.    My care provider  or his/her  assistants have  explained:   What I am having done and why I need it.  What other choices I can make instead of having this done.  The benefits and possible risks (problems) to me of having this done.  The benefits and possible risks (problems) to me of receiving transplants, blood, or blood products.  There is no guarantee of the results.  The care provider may not stay with me the entire time that I am in the operating or procedure room.  My provider has explained how this may affect my procedure. My provider has answered my  questions about this.         I give my  permission for  this surgery or  procedure.            _______________________________________________                                     My signature  (or parent or other person authorized to sign for you, if you are unable to sign for  yourself or if you are under 21 years old)        ______           Date        _____        Time   Electronic Signatures will display at the bottom of the consent form.    Care provider's statement: I have discussed the planned procedure, including the possibility for transfusion of blood  products or receipt of tissue as necessary; expected benefits; the possible complications and risks; and possible alternatives  and their benefits and risks with the patients or the patient's surrogate. In my opinion, the patient or the patient's surrogate  understands the proposed procedure, its risks, benefits and alternatives.              Electronically signed by: Ruben Im, MD  06/24/2020         Date        1:16 PM        Time

## 2020-07-05 ENCOUNTER — Ambulatory Visit: Payer: Medicaid Other | Admitting: Orthopedic Surgery

## 2020-07-06 ENCOUNTER — Telehealth: Payer: Self-pay | Admitting: Family Medicine

## 2020-07-06 DIAGNOSIS — S83411S Sprain of medial collateral ligament of right knee, sequela: Secondary | ICD-10-CM

## 2020-07-06 DIAGNOSIS — M25561 Pain in right knee: Secondary | ICD-10-CM

## 2020-07-06 NOTE — Addendum Note (Signed)
Addended by: Carmela Rima on: 07/06/2020 03:33 PM     Modules accepted: Orders

## 2020-07-06 NOTE — Telephone Encounter (Signed)
[facility] called states patient has been being seen for Right knee. They are requesting a referral be faxed to [phone]

## 2020-07-08 ENCOUNTER — Encounter: Payer: Self-pay | Admitting: Orthopedic Surgery

## 2020-07-13 ENCOUNTER — Encounter: Payer: Self-pay | Admitting: Gastroenterology

## 2020-07-22 ENCOUNTER — Encounter: Payer: Self-pay | Admitting: Gastroenterology

## 2020-07-25 ENCOUNTER — Encounter: Payer: Self-pay | Admitting: Family Medicine

## 2020-07-25 ENCOUNTER — Telehealth: Payer: Self-pay

## 2020-07-25 NOTE — Telephone Encounter (Signed)
Faxed referral

## 2020-07-28 ENCOUNTER — Ambulatory Visit: Payer: Medicaid Other | Admitting: Family Medicine

## 2020-07-28 NOTE — Progress Notes (Deleted)
[facility] Family Medicine - Outpatient Progress Note      Subjective     [name] is a 34 y.o. male who presents today for:    #Right knee pain  - ***  Patient complains of right knee pain. This is evaluated as a personal injury had fallen down the stair and was seen in ED on 03/30/2020. The pain is located patellar.  He describes the symptoms as numbing, shooting, throbbing and tingling. Symptoms improve with rest. The symptoms are worse with activity, kneeling, sitting for prolonged periods of time. The knee has given out or felt unstable. The patient cannot bend and straighten the knee fully. Treatment to date has been ice, NSAID's, knee sleeve/brace, therapy, crutches, with some relief.Taking ibuprofen 600 mg TID but has been taking more,  pain has been worse at night which is interrupting his sleep. Recent fall last Saturday and saw ortho on 04/28/20, pain is about 7/10 last ibuprofen 0500. Sharp, throbbing pain, can't bend or straightening, numbing and tingling to the top and back of knee. Mostly throbbing pain and pain at night when he moves in an awkward position during his sleep.  Was seeing ortho at [facility] for finger injury and referred to [facility] due to scheduling/timing for right knee pain post ED visit.     Reviewed MRI Right Knee , surgery consult on 6/[address] location     #***  - ***    #HM  Health Maintenance Due   Topic Date Due    IMM-INFLUENZA (1) Never done       Current Outpatient Medications   Medication Sig    gabapentin (NEURONTIN) 100MG  capsule Take 2 capsules (200 mg total) by mouth nightly X 3 night and increase to 3 capsule ( 300 mg) if clinically indicated    ibuprofen (ADVIL,MOTRIN) 600 MG tablet Take 1 tablet (600 mg total) by mouth 3 times daily as needed for Pain    omeprazole (PRILOSEC) 40 MG capsule Take 1 capsule (40 mg total) by mouth daily    aluminum & magnesium hydroxide w/simethicone (MAALOX  ADVANCED REGULAR) 200-200-20 MG/5ML susp Take 10 mLs by mouth 4 times daily as needed for Indigestion    albuterol HFA (PROVENTIL, VENTOLIN, PROAIR HFA) 108 (90 Base) MCG/ACT inhaler Inhale 1-2 puffs into the lungs every 6 hours as needed for Wheezing Shake well before each use.       PMHx, PSHx, FHx, SHx, and Allergies were populated in eRecord and reviewed.      Objective     Vitals  There were no vitals taken for this visit.  Wt Readings from Last 3 Encounters:   06/24/20 95.3 kg (210 lb)   05/02/20 93 kg (205 lb)   04/15/20 97.5 kg (215 lb)     BP Readings from Last 3 Encounters:   06/24/20 123/73   05/02/20 133/87   04/15/20 139/81     There is no height or weight on file to calculate BMI.    Physical Exam  Vitals: reviewed.  Constitutional: Patient is well-developed, well-nourished, and in no distress.   HEENT: Normocephalic and atraumatic. No oropharyngeal exudate.   Eyes: Conjunctivae non-erythematous, smooth b/l EOM. No eye discharge, no scleral icterus.   Neck: Normal range of motion.    Pulmonary: No respiratory distress.   Genitourinary: deferred   Musculoskeletal: Normal range of motion. No deformity.     Neurological: A&O*3, grossly normal muscle tone. Smooth, regular gait.  Skin: Skin is warm  and dry.  Not diaphoretic. No pallor.   Psychiatric: Mood, memory, affect and judgment normal.       Assessment/Plan     [name] is a 34 y.o. male here for:    ***    Follow Up: No follow-ups on file.      Ellamae Sia, MD  Family Medicine PGY-2  07/28/20  8:01 AM    To my patients:  Some of my notes are dictated using voice-recognition program which may result in minor transcription errors.  If you have any urgent concerns, please call the office or contact me through MyChart.  Please bring any non-urgent concerns to your next appointment so we can discuss them.  Thank you!

## 2020-08-02 ENCOUNTER — Encounter: Payer: Self-pay | Admitting: Family Medicine

## 2020-08-03 DIAGNOSIS — S83519A Sprain of anterior cruciate ligament of unspecified knee, initial encounter: Secondary | ICD-10-CM | POA: Insufficient documentation

## 2020-08-03 NOTE — Progress Notes (Deleted)
[facility] - Office Visit    SUBJECTIVE    CHIEF COMPLAINT: No chief complaint on file.    Problems reviewed at this visit were:   1. Rupture of anterior cruciate ligament of right knee, sequela    2. ACL (anterior cruciate ligament) tear      Reviewed MRI of right knee in June.  He had a torn ACL and some other abnormalities.  He had ACL and meniscal surgery with [facility] in early August and has reportedly been doing physical therapy. ***    Answers for HPI/ROS submitted by the patient on 08/03/2020  Are you experiencing pain as your primary problem for this visit?: Yes  result of injury: Yes  pain level: 7/10  pain progression: unchanged  Pain frequency: continuously  Comment: Had surgry had a little slip so think that mad it hurt more     ***       PMHx, PSHx, FHx, SHx, Medication List, and Allergies were populated in eRecord and reviewed.    ROS: pertinent in Subjective    OBJECTIVE  There were no vitals taken for this visit.  BP Readings from Last 3 Encounters:   06/24/20 123/73   05/02/20 133/87   04/15/20 139/81       Vitals: reviewed.  Gen: alert and appropriately conversant  ***                 ASSESSMENT & PLAN    1. Rupture of anterior cruciate ligament of right knee, sequela     2. ACL (anterior cruciate ligament) tear       ACL (anterior cruciate ligament) tear, right knee   Repaired by [facility] in August 2021.    ***    No follow-ups on file.     Santina Evans, MD MPH    To my patients: My notes are dictated using voice recognition software. If there is a major error that needs to be corrected urgently, please contact me via MyChart. If there is a minor error, please discuss this with me at your next appointment.

## 2020-08-03 NOTE — Assessment & Plan Note (Deleted)
Repaired by [facility] in August 2021.

## 2020-08-05 ENCOUNTER — Ambulatory Visit: Payer: Medicaid Other | Admitting: Internal Medicine

## 2020-09-05 ENCOUNTER — Encounter: Admission: RE | Payer: Self-pay | Source: Ambulatory Visit

## 2020-09-05 ENCOUNTER — Ambulatory Visit: Admission: RE | Admit: 2020-09-05 | Payer: Medicaid Other | Source: Ambulatory Visit | Admitting: Orthopedic Surgery

## 2020-09-05 SURGERY — OSTEOTOMY, HAND
Anesthesia: Monitor Anesthesia Care | Site: Wrist | Laterality: Left

## 2020-09-12 ENCOUNTER — Encounter: Payer: Medicaid Other | Admitting: Family

## 2020-10-10 ENCOUNTER — Encounter: Payer: Self-pay | Admitting: Gastroenterology

## 2021-01-02 ENCOUNTER — Encounter: Payer: Self-pay | Admitting: Gastroenterology

## 2021-01-04 ENCOUNTER — Ambulatory Visit
Admission: RE | Admit: 2021-01-04 | Discharge: 2021-01-04 | Disposition: A | Discharge status: Home / Self Care | Payer: Medicaid Other | Source: Ambulatory Visit

## 2021-01-04 ENCOUNTER — Ambulatory Visit: Payer: Medicaid Other | Admitting: Orthopedic Surgery

## 2021-01-04 ENCOUNTER — Encounter: Payer: Self-pay | Admitting: Orthopedic Surgery

## 2021-01-04 VITALS — BP 144/86 | Ht 71.0 in | Wt 210.0 lb

## 2021-01-04 DIAGNOSIS — S62641P Nondisplaced fracture of proximal phalanx of left index finger, subsequent encounter for fracture with malunion: Secondary | ICD-10-CM

## 2021-01-04 DIAGNOSIS — S62641D Nondisplaced fracture of proximal phalanx of left index finger, subsequent encounter for fracture with routine healing: Secondary | ICD-10-CM
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
## 2021-01-17 ENCOUNTER — Encounter: Payer: Self-pay | Admitting: Orthopedic Surgery

## 2021-01-17 ENCOUNTER — Ambulatory Visit: Payer: Medicaid Other | Admitting: Orthopedic Surgery

## 2021-01-17 VITALS — BP 142/77 | HR 71 | Ht 71.0 in | Wt 205.0 lb

## 2021-01-17 DIAGNOSIS — S62641P Nondisplaced fracture of proximal phalanx of left index finger, subsequent encounter for fracture with malunion: Secondary | ICD-10-CM
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
## 2021-01-19 ENCOUNTER — Encounter: Payer: Self-pay | Admitting: Orthopedic Surgery

## 2021-01-31 ENCOUNTER — Encounter: Payer: Self-pay | Admitting: Emergency Medicine

## 2021-01-31 ENCOUNTER — Emergency Department: Payer: Medicaid Other

## 2021-01-31 ENCOUNTER — Emergency Department
Admission: EM | Admit: 2021-01-31 | Discharge: 2021-01-31 | Disposition: A | Discharge status: Home / Self Care | Payer: Medicaid Other | Source: Ambulatory Visit | Attending: Emergency Medicine | Admitting: Emergency Medicine

## 2021-01-31 DIAGNOSIS — S4991XA Unspecified injury of right shoulder and upper arm, initial encounter: Secondary | ICD-10-CM

## 2021-01-31 DIAGNOSIS — F17210 Nicotine dependence, cigarettes, uncomplicated: Secondary | ICD-10-CM | POA: Insufficient documentation

## 2021-01-31 DIAGNOSIS — M25511 Pain in right shoulder: Secondary | ICD-10-CM

## 2021-01-31 DIAGNOSIS — M19011 Primary osteoarthritis, right shoulder: Secondary | ICD-10-CM
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
## 2021-01-31 NOTE — Discharge Instructions (Addendum)
You were seen today for shoulder pain. Xrays of your shoulder and clavicle who no concerning findings.     While in the Emergency Department you received a sling.    Make sure to get lots of rest, drink plenty of healthy fluids, and eat a nutritious balanced diet. You may take ibuprofen or tylenol over-the-counter as needed for pain, use as directed on the label. You may apply ice to the area for 20-30 minutes, 3-4 times daily as needed for pain or swelling.      Call your doctor to make a follow-up appointment. If any symptoms worsen or new symptoms develop that are concerning to you, call your doctor or return to the Emergency Department.

## 2021-02-02 ENCOUNTER — Ambulatory Visit: Payer: Medicaid Other | Admitting: Family Medicine

## 2021-02-02 ENCOUNTER — Encounter: Payer: Self-pay | Admitting: Family Medicine

## 2021-02-02 VITALS — BP 120/86 | HR 88 | Temp 97.8°F | Ht 70.87 in | Wt 211.0 lb

## 2021-02-02 DIAGNOSIS — M25511 Pain in right shoulder: Secondary | ICD-10-CM

## 2021-02-02 MED ORDER — NAPROXEN 500 MG PO TABS *I*
500.0000 mg | ORAL_TABLET | Freq: Two times a day (BID) | ORAL | 0 refills | Status: AC
Start: 2021-02-02 — End: 2021-02-16

## 2021-02-02 MED ORDER — KETOROLAC TROMETHAMINE 15 MG/ML IJ SOLN *I*
15.0000 mg | Freq: Once | INTRAMUSCULAR | Status: AC
Start: 2021-02-02 — End: 2021-02-02
  Administered 2021-02-02: 15 mg via INTRAMUSCULAR
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
## 2021-02-04 ENCOUNTER — Ambulatory Visit
Admission: RE | Admit: 2021-02-04 | Discharge: 2021-02-04 | Disposition: A | Discharge status: Home / Self Care | Payer: Medicaid Other | Source: Ambulatory Visit | Attending: Family Medicine | Admitting: Family Medicine

## 2021-02-04 DIAGNOSIS — M19011 Primary osteoarthritis, right shoulder: Secondary | ICD-10-CM | POA: Insufficient documentation

## 2021-02-04 DIAGNOSIS — M25511 Pain in right shoulder: Secondary | ICD-10-CM

## 2021-02-04 DIAGNOSIS — M67911 Unspecified disorder of synovium and tendon, right shoulder: Secondary | ICD-10-CM | POA: Insufficient documentation

## 2021-02-06 ENCOUNTER — Encounter: Payer: Self-pay | Admitting: Family Medicine

## 2021-02-06 NOTE — Addendum Note (Signed)
Addended by: Amanda Cockayne on: 02/06/2021 12:57 PM     Modules accepted: Orders

## 2021-02-07 ENCOUNTER — Encounter: Payer: Self-pay | Admitting: Internal Medicine

## 2021-02-07 ENCOUNTER — Encounter: Payer: Self-pay | Admitting: Family Medicine

## 2021-02-09 NOTE — Telephone Encounter (Signed)
Placed form in Katie's box for completion

## 2021-02-13 ENCOUNTER — Encounter: Payer: Self-pay | Admitting: Family Medicine

## 2021-02-17 ENCOUNTER — Encounter: Payer: Self-pay | Admitting: Internal Medicine

## 2021-02-20 ENCOUNTER — Ambulatory Visit: Payer: Medicaid Other | Attending: Family Medicine | Admitting: Physical Therapy

## 2021-02-20 ENCOUNTER — Ambulatory Visit: Payer: Medicaid Other | Admitting: Surgery

## 2021-02-20 VITALS — BP 125/81 | HR 70 | Temp 97.9°F | Ht 71.65 in | Wt 206.0 lb

## 2021-02-20 DIAGNOSIS — M25511 Pain in right shoulder: Secondary | ICD-10-CM | POA: Insufficient documentation

## 2021-02-20 MED ORDER — KETOROLAC TROMETHAMINE 15 MG/ML IJ SOLN *I*
15.0000 mg | Freq: Once | INTRAMUSCULAR | Status: AC | PRN
Start: 2021-02-20 — End: 2021-02-20
  Administered 2021-02-20: 15 mg via INTRAMUSCULAR

## 2021-02-20 MED ORDER — MELOXICAM 7.5 MG PO TABS *I*
7.5000 mg | ORAL_TABLET | Freq: Every day | ORAL | 0 refills | Status: AC
Start: 2021-02-20 — End: 2021-03-22
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
## 2021-02-20 NOTE — Progress Notes (Signed)
Pt received injection for right shoulder pain he rates 9/10.  Pt was informed ketorolac onset of action is 10 minutes, peaks at 2 hours and relief may last up to 6 hours.  Patient instructed  follow the clinicians plan for follow up and call [facility] if problem fails to improve, or becomes worse.

## 2021-02-24 ENCOUNTER — Ambulatory Visit: Payer: Medicaid Other | Admitting: Internal Medicine

## 2021-02-24 ENCOUNTER — Encounter: Payer: Self-pay | Admitting: Internal Medicine

## 2021-02-24 VITALS — BP 138/95 | HR 60 | Temp 97.3°F

## 2021-02-24 DIAGNOSIS — M25511 Pain in right shoulder: Secondary | ICD-10-CM

## 2021-02-24 DIAGNOSIS — K219 Gastro-esophageal reflux disease without esophagitis: Secondary | ICD-10-CM

## 2021-02-24 DIAGNOSIS — M67911 Unspecified disorder of synovium and tendon, right shoulder: Secondary | ICD-10-CM

## 2021-02-24 MED ORDER — OMEPRAZOLE 40 MG PO CPDR *I*
40.0000 mg | DELAYED_RELEASE_CAPSULE | Freq: Every day | ORAL | 5 refills | Status: DC
Start: 2021-02-24 — End: 2022-04-10

## 2021-02-24 MED ORDER — ALUM & MAG HYDROXIDE-SIMETH 200-200-20 MG/5ML PO SUSP *I*
10.0000 mL | Freq: Four times a day (QID) | ORAL | 5 refills | Status: DC | PRN
Start: 2021-02-24 — End: 2023-07-31

## 2021-02-24 NOTE — Progress Notes (Signed)
[facility] - Office Visit    SUBJECTIVE    CHIEF COMPLAINT:   Chief Complaint   Patient presents with    Shoulder Pain     Problems reviewed at this visit were:   1. Acute pain of right shoulder    2. Tendinopathy of right rotator cuff    3. Gastroesophageal reflux disease, unspecified whether esophagitis present      Seen 4/11 for right shoulder pain. Prescribed meloxicam and given a dose of toradol.  Hurt the shoulder around 3/22.  Seen by PT earlier this week for first visit.  Still having a lot of pain in the right shoulder. Difficulty sleeping.  Overhead motions, pushing motions, reaching backwards cause the most pain.  Meloxicam provides temporary relief then it wears off.  Next PT appointment on 4/19.    Currently on temporary disability. 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  - Not a work-related injury.    Answers for HPI/ROS submitted by the patient on 02/17/2021  Are you experiencing pain as your primary problem for this visit?: Yes  result of injury: Yes  Onset: more than 2 weeks ago  Pain location: Rt Shoulder  pain level: 9/10  pain progression: gradually worsening  Pain frequency: continuously  Comment: Been taking the naproxen still no relief and pain is interfering with sleep    Needs a prilosec and maalox refill.     OBJECTIVE  BP (!) 138/95 (BP Location: Left arm, Patient Position: Sitting, Cuff Size: large adult)    Pulse 60    Temp 36.3 C (97.3 F) (Temporal)   BP Readings from Last 3 Encounters:   02/24/21 (!) 138/95   02/20/21 125/81   02/02/21 120/86     Vitals: reviewed.  Gen: alert and appropriately conversant       ASSESSMENT & PLAN    1. Acute pain of right shoulder     2. Tendinopathy of right rotator cuff     3. Gastroesophageal reflux disease, unspecified whether esophagitis present  omeprazole (PRILOSEC) 40 mg capsule    aluminum & magnesium hydroxide w/simethicone (MAALOX ADVANCED REGULAR) 200-200-20 MG/5ML susp     Right shoulder pain: Offered repeat  ketorolac vs steroid injection but he declines. Emphasized importance of continuing PT and home exercise regimen.     Refills as above.    Follow up in about 8 weeks (around 04/21/2021) for shoulder pain.     Santina Evans, MD MPH    To my patients: My notes are dictated using voice recognition software. If there is a major error that needs to be corrected urgently, please contact me via MyChart. If there is a minor error, please discuss this with me at your next appointment.

## 2021-02-28 ENCOUNTER — Ambulatory Visit: Payer: Medicaid Other | Admitting: Physical Therapy

## 2021-02-28 NOTE — Progress Notes (Deleted)
[facility] / Upper Extremity Rehabilitation Treatment Note    Todays Date: 02/28/2021    Name: [name]  DOB: Nov 15, 1985  Referring Physician: Amanda Cockayne, NP  Diagnosis: No diagnosis found.    Visit #: Visit count could not be calculated. Make sure you are using a visit which is associated with an episode.    Subjective:  4/19       Objective:  Function: - {Improved/Worse/unchg:26239}  Education:  {Education:26246}         UE      AROM AROM PROM PROM MMT MMT    Right Left Right Left Right Left   Shoulder          Forward Flexion 130  165  5-*    Extension         Abduction  115  165  4*    Internal Rotation sacrum  50  5-    External Rotation 55, c5  60  4*                 Hand dominance:  right          Treatment:  {Modalities:26249}    Assessment:   ***       Plan of Care:  Continue per Plan of care -  {Plan:26262}    Thank you for referring this patient to [facility] - Hand and Upper Extremity Rehabilitation    Biagio Borg, PT          Wall slides flexion 2x10   Table slide flexion, scaption 5 min   Dowel flexion, scaption 2x10   Banded ER Orange band 2x15                       Pt edu A&P, activity recommendations

## 2021-03-07 ENCOUNTER — Ambulatory Visit: Payer: Medicaid Other | Admitting: Physical Therapy

## 2021-03-07 NOTE — Progress Notes (Deleted)
[facility] Hand / Upper Extremity Rehabilitation Treatment Note    Todays Date: 03/07/2021    Name: [name]  DOB: 11/20/1985  Referring Physician: Amanda Cockayne, NP  Diagnosis: No diagnosis found.    Visit #: Visit count could not be calculated. Make sure you are using a visit which is associated with an episode.    Subjective:  Pain Assessment: {PAIN SCALE:23261}  {PT/OT Subjective:23896}       Objective:  ROM -  {Location:26241}, {ROM:26242}  Strength - {Strength:26243}  Function: - {Improved/Worse/unchg:26239}  Education:  {Education:26246}       *denotes pain     UE      AROM AROM PROM PROM MMT MMT    Right Left Right Left Right Left   Shoulder          Forward Flexion 130  165  5-*    Extension         Abduction  115  165  4*    Internal Rotation sacrum  50  5-    External Rotation 55, c5  60  4*                     Treatment:  {Modalities:26249}    Assessment:   ***       Plan of Care:  Continue per Plan of care -  {Plan:26262}    Thank you for referring this patient to [facility] - Hand and Upper Extremity Rehabilitation    Biagio Borg, PT        Wall slides flexion 2x10   Table slide flexion, scaption 5 min   Dowel flexion, scaption 2x10   Banded ER Orange band 2x15                       Pt edu A&P, activity recommendations

## 2021-03-10 ENCOUNTER — Ambulatory Visit: Payer: Medicaid Other | Attending: Family

## 2021-03-10 DIAGNOSIS — S62641P Nondisplaced fracture of proximal phalanx of left index finger, subsequent encounter for fracture with malunion: Secondary | ICD-10-CM | POA: Insufficient documentation

## 2021-03-10 DIAGNOSIS — Z01812 Encounter for preprocedural laboratory examination: Secondary | ICD-10-CM | POA: Insufficient documentation

## 2021-03-10 DIAGNOSIS — Z20828 Contact with and (suspected) exposure to other viral communicable diseases: Secondary | ICD-10-CM | POA: Insufficient documentation

## 2021-03-10 DIAGNOSIS — Z20822 Contact with and (suspected) exposure to covid-19: Secondary | ICD-10-CM | POA: Insufficient documentation

## 2021-03-10 LAB — COVID-19 PCR

## 2021-03-10 LAB — COVID-19 NAAT (PCR): COVID-19 NAAT (PCR): NEGATIVE

## 2021-03-10 NOTE — Anesthesia Preprocedure Evaluation (Addendum)
Anesthesia Pre-operative History and Physical for [name]    Highlighted Issues for this Procedure:  35 y.o. 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  Marland Kitchen  Anesthesia Evaluation Information Source: records, patient     ANESTHESIA HISTORY  Pertinent(-):  No History of anesthetic complications or Family hx of anesthetic complications     Comment: No prior surgery    GENERAL  Pertinent (-):  No history of anesthetic complications or Family Hx of Anesthetic Complications     PULMONARY    + Smoker          tobacco currently, tobacco advised to quit    + Asthma          mild intermittent  Pertinent(-):  No recent URI, snoring or sleep apnea    CARDIOVASCULAR  Good(4+METs) Exercise Tolerance  Pertinent(-):  No hypertension or angina    GI/HEPATIC/RENAL   NPO: > 8hrs ago (solids) and > 2hrs ago (clears)      + GERD          well controlled  Pertinent(-):  No liver  issues or renal issues  NEURO/PSYCH  Pertinent(-):  No dizziness/motion sickness, seizures or cerebrovascular event    ENDO/OTHER    + Diabetes Mellitus  Pertinent(-):  No thyroid disease     Comment:       HEMATOLOGIC  Pertinent(-):  No bruising/bleeding easily         Physical Exam    Airway            Mouth opening: normal            Mallampati: II            TM distance (fb): >3 FB            Neck ROM: full            Airway Impression: easy  Dental        Cardiovascular  Normal Exam        General Survey    Normal Exam   Pulmonary   Normal Exam           ________________________________________________________________________  PLAN  ASA Score  2  Anesthetic Plan general       Induction (routine IV) General Anesthesia/Sedation Maintenance Plan (propofol infusion and IV bolus); Airway (nasal cannula); Line ( use current access); Monitoring (standard ASA); Positioning (supine);  PONV Plan (dexamethasone and ondansetron); Pain (per surgical team and intraop local); PostOp (PACU)    Informed Consent     Risks:         Risks discussed were commensurate with the plan listed above with the following specific points: aspiration, N/V and sore throat, Damage to: eyes and teeth, allergic Rx and unexpected serious injury.    Anesthetic Consent:         Anesthetic plan (and risks as noted above) were discussed with patient    Plan also discussed with team members including:       CRNA and resident    Responsible Anesthesia Provider Attestation:  I attest that the patient or proxy understands and accepts the risks and benefits of the anesthesia plan. I also attest that I have personally performed a pre-anesthetic examination and evaluation, and prescribed the anesthetic plan for this particular location within 48 hours prior to the  anesthetic as documented. Rachel Bo, MD  03/15/21, 8:07 AM

## 2021-03-14 ENCOUNTER — Ambulatory Visit: Payer: Medicaid Other | Admitting: Physical Therapy

## 2021-03-15 ENCOUNTER — Encounter: Payer: Medicaid Other | Admitting: Anesthesiology

## 2021-03-15 ENCOUNTER — Encounter
Admission: RE | Disposition: A | Discharge status: Home / Self Care | Payer: Self-pay | Source: Ambulatory Visit | Attending: Orthopedic Surgery

## 2021-03-15 ENCOUNTER — Encounter: Payer: Self-pay | Admitting: Anesthesiology

## 2021-03-15 ENCOUNTER — Other Ambulatory Visit: Payer: Self-pay

## 2021-03-15 ENCOUNTER — Ambulatory Visit
Admission: RE | Admit: 2021-03-15 | Discharge: 2021-03-15 | Disposition: A | Discharge status: Home / Self Care | Payer: Medicaid Other | Source: Ambulatory Visit | Attending: Orthopedic Surgery | Admitting: Orthopedic Surgery

## 2021-03-15 DIAGNOSIS — S62611P Displaced fracture of proximal phalanx of left index finger, subsequent encounter for fracture with malunion: Secondary | ICD-10-CM

## 2021-03-15 DIAGNOSIS — S62641P Nondisplaced fracture of proximal phalanx of left index finger, subsequent encounter for fracture with malunion: Secondary | ICD-10-CM

## 2021-03-15 DIAGNOSIS — E119 Type 2 diabetes mellitus without complications: Secondary | ICD-10-CM | POA: Insufficient documentation

## 2021-03-15 DIAGNOSIS — F17210 Nicotine dependence, cigarettes, uncomplicated: Secondary | ICD-10-CM | POA: Insufficient documentation

## 2021-03-15 DIAGNOSIS — J4520 Mild intermittent asthma, uncomplicated: Secondary | ICD-10-CM | POA: Insufficient documentation

## 2021-03-15 DIAGNOSIS — M20092 Other deformity of left finger(s): Secondary | ICD-10-CM

## 2021-03-15 DIAGNOSIS — X58XXXD Exposure to other specified factors, subsequent encounter: Secondary | ICD-10-CM | POA: Insufficient documentation

## 2021-03-15 HISTORY — PX: PR OSTEOTOMY PHALANX FINGER EACH: 26567

## 2021-03-15 SURGERY — ORIF, FINGER
Anesthesia: General | Site: Hand | Laterality: Left | Wound class: Clean

## 2021-03-15 MED ORDER — LIDOCAINE HCL (PF) 1 % IJ SOLN *I*
0.1000 mL | Freq: Once | INTRAMUSCULAR | Status: AC | PRN
Start: 2021-03-15 — End: 2021-03-15

## 2021-03-15 MED ORDER — MIDAZOLAM HCL 1 MG/ML IJ SOLN *I* WRAPPED
INTRAMUSCULAR | Status: AC
Start: 2021-03-15 — End: 2021-03-15
  Filled 2021-03-15: qty 2

## 2021-03-15 MED ORDER — PROPOFOL INFUSION 10 MG/ML *I*
INTRAVENOUS | Status: DC | PRN
Start: 2021-03-15 — End: 2021-03-15
  Administered 2021-03-15: 150 ug/kg/min via INTRAVENOUS
  Administered 2021-03-15: 125 ug/kg/min via INTRAVENOUS

## 2021-03-15 MED ORDER — LIDOCAINE HCL 2 % IJ SOLN *I*
INTRAMUSCULAR | Status: DC | PRN
Start: 2021-03-15 — End: 2021-03-15
  Administered 2021-03-15: 20 mg via INTRAVENOUS

## 2021-03-15 MED ORDER — LACTATED RINGERS IV SOLN *I*
20.0000 mL/h | INTRAVENOUS | Status: DC
Start: 2021-03-15 — End: 2021-03-16
  Administered 2021-03-15: 20 mL/h via INTRAVENOUS

## 2021-03-15 MED ORDER — CEFAZOLIN 1000 MG IN STERILE WATER 10ML SYRINGE *I*
PREFILLED_SYRINGE | INTRAVENOUS | Status: AC
Start: 2021-03-15 — End: 2021-03-15
  Filled 2021-03-15: qty 20

## 2021-03-15 MED ORDER — KETOROLAC TROMETHAMINE 15 MG/ML IJ SOLN *I*
INTRAMUSCULAR | Status: DC | PRN
Start: 2021-03-15 — End: 2021-03-15
  Administered 2021-03-15: 30 mg via INTRAVENOUS

## 2021-03-15 MED ORDER — DEXAMETHASONE SODIUM PHOSPHATE 4 MG/ML INJ SOLN *WRAPPED*
INTRAMUSCULAR | Status: DC | PRN
Start: 2021-03-15 — End: 2021-03-15
  Administered 2021-03-15: 4 mg via INTRAVENOUS

## 2021-03-15 MED ORDER — FENTANYL CITRATE 50 MCG/ML IJ SOLN *WRAPPED*
INTRAMUSCULAR | Status: DC | PRN
Start: 2021-03-15 — End: 2021-03-15
  Administered 2021-03-15: 50 ug via INTRAVENOUS
  Administered 2021-03-15 (×2): 25 ug via INTRAVENOUS

## 2021-03-15 MED ORDER — CEFAZOLIN 1000 MG IN STERILE WATER 10ML SYRINGE *I*
2000.0000 mg | PREFILLED_SYRINGE | Freq: Once | INTRAVENOUS | Status: AC
Start: 2021-03-15 — End: 2021-03-15
  Administered 2021-03-15: 2000 mg via INTRAVENOUS

## 2021-03-15 MED ORDER — PROPOFOL 10 MG/ML IV EMUL (INTERMITTENT DOSING) WRAPPED *I*
INTRAVENOUS | Status: DC | PRN
Start: 2021-03-15 — End: 2021-03-15
  Administered 2021-03-15: 30 mg via INTRAVENOUS
  Administered 2021-03-15: 20 mg via INTRAVENOUS

## 2021-03-15 MED ORDER — MIDAZOLAM HCL 1 MG/ML IJ SOLN *I* WRAPPED
INTRAMUSCULAR | Status: DC | PRN
Start: 2021-03-15 — End: 2021-03-15
  Administered 2021-03-15: 2 mg via INTRAVENOUS

## 2021-03-15 MED ORDER — GLYCOPYRROLATE 0.2 MG/ML IJ SOLN *WRAPPED*
INTRAMUSCULAR | Status: DC | PRN
  Administered 2021-03-15: .2 mg via INTRAVENOUS

## 2021-03-15 MED ORDER — DEXMEDETOMIDINE HCL 200 MCG/2ML IV SOLN WRAPPED *I*
INTRAVENOUS | Status: DC | PRN
Start: 2021-03-15 — End: 2021-03-15
  Administered 2021-03-15: 12 ug via INTRAVENOUS
  Administered 2021-03-15: 8 ug via INTRAVENOUS

## 2021-03-15 MED ORDER — OXYCODONE HCL 5 MG PO TABS *I*
5.0000 mg | ORAL_TABLET | Freq: Four times a day (QID) | ORAL | 0 refills | Status: DC | PRN
Start: 2021-03-15 — End: 2021-03-17
  Filled 2021-03-15: qty 5, 2d supply, fill #0

## 2021-03-15 MED ORDER — ONE STEP DOSE TO ADMINISTER *I*
INTRAMUSCULAR | Status: DC | PRN
Start: 2021-03-15 — End: 2021-03-15
  Administered 2021-03-15: 10 mL via SUBCUTANEOUS

## 2021-03-15 MED ORDER — FENTANYL CITRATE 50 MCG/ML IJ SOLN *WRAPPED*
INTRAMUSCULAR | Status: AC
Start: 2021-03-15 — End: 2021-03-15
  Filled 2021-03-15: qty 2

## 2021-03-15 MED ORDER — ONDANSETRON HCL 2 MG/ML IV SOLN *I*
INTRAMUSCULAR | Status: DC | PRN
Start: 2021-03-15 — End: 2021-03-15
  Administered 2021-03-15: 4 mg via INTRAMUSCULAR

## 2021-03-15 MED ORDER — ACETAMINOPHEN 325 MG PO TABS *I*
ORAL_TABLET | ORAL | Status: AC
Start: 2021-03-15 — End: 2021-03-15
  Filled 2021-03-15: qty 3

## 2021-03-15 MED ORDER — ACETAMINOPHEN 325 MG PO TABS *I*
975.0000 mg | ORAL_TABLET | Freq: Once | ORAL | Status: AC
Start: 2021-03-15 — End: 2021-03-15
  Administered 2021-03-15: 975 mg via ORAL

## 2021-03-15 MED ORDER — ACETAMINOPHEN 160 MG/5 ML WRAPPED *I*
975.0000 mg | Freq: Once | Status: AC
Start: 2021-03-15 — End: 2021-03-15

## 2021-03-15 SURGICAL SUPPLY — 38 items
APPLICATOR CHLORAPREP 26ML ORANGE LARGE (Solution) ×3 IMPLANT
BALL PIN GREEN .062 (Supply)
BALL PIN PROTCT DIA3/8IN GRN STD GEN AUTOCLV REFIL PK FOR 0.054-0.062IN WIRE W-SER (Supply) IMPLANT
BALL PIN YELLOW .045 (Supply) IMPLANT
BANDAGE ELAST SLF-CLSR 2 X5.5 LF NONSTER (Supply) ×1 IMPLANT
BANDAGE GYPSONA PLASTER X FAST 3INX3YD (Dressing) ×2 IMPLANT
BANDAGE SLF ADHER 2IN NONSTER LF (Dressing) ×2 IMPLANT
BIT DRILL 1.1MM 555 40MM QUICK (Supply) ×2 IMPLANT
BLADE SAG FINE 4.5 X 25.5 X .4MM (Supply) ×1 IMPLANT
CORD BIPOLAR 12 FT (Supply) ×2
CORD ELECTROSURGICAL L12FT BIPOLAR DISPOSABLE FOR FOOT SWITCHING FORCEPS (Supply) ×1 IMPLANT
DRAPE FLUOROSCAN MINI C-ARM (Drape) ×3 IMPLANT
DRAPE STERI TOWEL 1010 NL (Drape) ×3 IMPLANT
DRESSING FLUFF SUPER SP 6 X 6.75IN STER (Dressing) ×9 IMPLANT
DRESSING XEROFORM STR 1 IN X8 (Dressing) ×3 IMPLANT
GLOVE BIOGEL PI MICRO IND UNDER SZ 7.5 LF (Glove) ×9 IMPLANT
GLOVE SURG BIOGEL PI ULTRATOUCH SZ 7.5 (Glove) ×9 IMPLANT
GLOVE SURG BIOGEL PI ULTRATOUCH SZ 8.0 (Glove) ×3 IMPLANT
GOWN SIRIUS RAGLAN NONREINFORCED XL (Gown) ×5 IMPLANT
PACK CUSTOM HAND AOC LF (Pack) ×1 IMPLANT
PACK CUSTOM HAND CDS (Pack) ×2
PLATE BONE TI STR 6 HL 1.5X29M (Plate) ×2 IMPLANT
SCREW BNE L12MM DIA1.5MM CORT SS ST FT CRUCFRM RECESS FOR MOD MINI FRAG SYS (Screw) ×2 IMPLANT
SCREW BNE L14MM DIA1.5MM CORT HND SS ST NONLOCK LP FOR MOD SYS PRO PK (Screw) ×2 IMPLANT
SCREW BNE L16MM DIA1.5MM CORT SS ST FT CRUCFRM RECESS FOR MOD MINI FRAG SYS (Screw) ×2 IMPLANT
SCREW CORTEX SELF TAPPING 1.5MM X 18MM (Screw) ×4 IMPLANT
SLEEVE COMP KNEE HI MED (Supply) ×6 IMPLANT
SOL SOD CHL IRRIG 250ML BTL (Solution) ×3 IMPLANT
SPIKE MINI DISPENSING PIN (BBRAUN DP1000) (Supply) ×3 IMPLANT
SUTR CHROMIC GUT 4-0 PS-2 18 IN (Suture) IMPLANT
SUTR ETHILON 4-0 PS-2 BLACK (Suture) IMPLANT
SUTR ETHILON MONO 5-0 PS-2 BLACK (Suture) ×2 IMPLANT
SUTR MONOCRYL PLUS 4-0 18PS2 (Suture) IMPLANT
SUTR VICRYL ANTIB 4-0 PS-2 27 UNDYED (Suture) ×2 IMPLANT
SYRINGE LUERLOCK 30ML INDIVIDUAL WRAP (Syringe) ×2 IMPLANT
SYRINGE ONLY 30ML LUER-LOK (Syringe) ×1
WIRE K DBL TROCAR END SHT SMTH .035X4IN (Implant) ×2 IMPLANT
WIRE K DBL TROCAR END SHT SMTH .054X4IN (Implant) ×1 IMPLANT

## 2021-03-15 NOTE — Anesthesia Procedure Notes (Signed)
---------------------------------------------------------------------------------------------------------------------------------------    AIRWAY   GENERAL INFORMATION AND STAFF    Patient location during procedure: OR       Date of Procedure: 03/15/2021 10:03 AM  CONDITION PRIOR TO MANIPULATION     Current Airway/Neck Condition:  Normal        For more airway physical exam details, see Anesthesia PreOp Evaluation  AIRWAY METHOD     Patient Position:  Sniffing    Preoxygenated: yes      Maintained In-Line Stability: not needed, normal c-spine condition          To see details of medications used, see MAR    Number of Attempts at Approach:  1  FINAL AIRWAY DETAILS    Final Airway Type:  Nasal cannula    Adjunct Airway: oropharyngeal airway (OPA)    OPA Size:     Head position required to avoid obstruction:  Turned to right    Insertion Site:  Oral  ----------------------------------------------------------------------------------------------------------------------------------------

## 2021-03-15 NOTE — Discharge Instructions (Addendum)
DISCHARGE INSTRUCTIONS  Dr. Myrtice Lauth  Phone [phone]  ACTIVITY:  Rest today; tomorrow you may resume your usual activities    DIET:   Resume your usual diet.    MEDICATIONS:  Resume your usual medication.  If you are taking prescribed pain medication, you should not drive, operate machinery, power tools, or drink any alcoholic beverages.  Tylenol 325 mg or 500 mg and take with Ibuprofen 400mg  (take together) around the clock for 24 hours.     If applicable, you may take over the counter Ibuprofen (Motrin, Advil) 2 tabs in between or with doses of prescribed pain medicine.    DRESSINGS:  Keep your dressing clean and dry until you see Dr. or Hand Therapy unless otherwise instructed.  Use an ice bag over your incision site for 48 hours.  Keep a washcloth between the ice bag and cast/bandage to keep it dry.  (45 minutes on/15 off max).      SPECIAL INSTRUCTIONS:  You may take a bath/shower anytime.  Keep splint/dressing dry (out of shower and covered).  Driving is up to your discretion, but it is not advisable while taking prescribed pain medication.  Specific circumstances should be discussed with Dr. Barb Merino.    SURGERY TO A LIMB:  Keep limb elevated on 2-3 pillows above the level of your heart for 48 hours.  Gently move the fingers of your affected limb that are not bandaged/involved.    YOU SHOULD CALL YOUR DOCTOR FOR ANY OF THE FOLLOWING:  Fever of 101 or higher.  Foul smelling drainage from incision or cast.  Pain that does not lessen with pain medication as prescribed by Dr. Barb Merino  Persistent nausea or vomiting into the next day.  Bleeding or continuous oozing that saturates the bandage that does not stop after applying pressure to wound for 10 minutes.  Swelling of limb or severe tightness of bandage not relieved with elevation of limb above the level of your heart.  Numbness or tingling lasting longer than 24 hours.  Pale, blue or cold fingers/ nail beds (compared to opposite side).  If  you have not urinated within 6 hours after discharge.    Due to the lasting effects of anesthesia, we recommend you do not make any important decisions for 24 hours.    Follow-up care: Call Dr. Barb Merino office [phone] if you do not have an appointment yet.    In case of an emergency in which you cannot reach your physician, please go directly to the nearest hospital emergency room department.    About your medications from today    []  Prescription information provided from onsite pharmacy.   []  Prescription information given to patient and/or patient representative, prescription not filled at onsite pharmacy.  [x]  No Prescription given.        Your next dose of pain medication is due after: tylenol @ 2p, advil @ 4p

## 2021-03-15 NOTE — Op Note (Signed)
[name], [name]  MR #:  [id number]   CSN:  [id number] DOB:  Aug 14, 1986    AGE:  35     SURGEON:  Eddie Candle, MD  CO-SURGEON:    ASSISTANT:  Varney Baas, MD, Fellow.  SURGERY DATE:  03/15/2021    PREOPERATIVE DIAGNOSIS:  Left index finger proximal phalanx malunion.    POSTOPERATIVE DIAGNOSIS:  Left index finger proximal phalanx malunion.    OPERATIVE PROCEDURE:  Left index finger proximal phalanx malunion correction with open reduction, internal fixation and autograft.    ANESTHESIA:  Local anesthetic and monitored anesthesia care.    ESTIMATED BLOOD LOSS:  2 mL.    SPECIMENS SENT TO PATHOLOGY:  None.    PATIENT CONDITION:  Good.    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  A thorough discussion was held regarding treatment options.  We discussed risks, benefits, alternative treatments to the above-stated surgery, and he provided informed consent.    DESCRIPTION OF PROCEDURE:  The patient was met in the preanesthesia holding area, where the left index finger was marked and signed as the correct operative side and site of surgery.  Informed consent was once again confirmed.  The patient was transferred to the operating room by the anesthesia team and monitored anesthesia care was smoothly induced.  The left upper extremity was prepped and draped in the usual sterile fashion.  A standard hospital presurgical time-out was initiated and completed.  Local anesthetic consisting of 10 mL total of a 1:1 mixture of 1% lidocaine and 0.5% bupivacaine was used for left index finger block.  The left upper extremity was exsanguinated and tourniquet raised to 250 mmHg.  Total tourniquet time at the end the case was less than 90 minutes.       A mid axial incision was made on the radial side of the proximal phalanx.  Dissection was carried down through  subcutaneous tissue.  Bipolar cautery was used as needed for hemostasis.  An incision was made more so volar to the extensor hood and lateral bands and dissection was carried down to bone and lifted up volarly and dorsally initially sharply and then with a Soil scientist, and we were able to directly visualize the proximal phalanx.  K-wires were used to confirm the angles for osteotomy under fluoroscopy, and then, microsagittal saw was used to do a closing wedge osteotomy on the radial side.  With this completed, fluoroscopy shots confirmed appropriate alignment following this closing wedge osteotomy.  A 1.5 mm plate was used.  A 6-hole plate was cut down to 5 holes and fluoroscopy shots confirmed appropriate plate positioning.  Compression was achieved through the plate and a total of five 1.5 screws were used to compress at the osteotomy site.  Fluoroscopy shots confirmed appropriate alignment.  The closing wedge bone taken out was used as autograft as well.  Once completed, final fluoroscopy shots confirmed appropriate finger alignment and plate positioning and screw lengths.       Closure was obtained with Vicryl sutures for deep closure and nylons for superficial for skin.  Dressing consisted of Xeroform, gauze, and loosely applied Coban.  The patient was awoken from monitored anesthesia care and taken to PACU in stable condition.  All counts were correct at the end of the case.  ______________________________  Eddie Candle, MD    BM/MODL  DD:  03/15/2021 10:32:13  DT:  03/15/2021 11:22:00  Job #:  [id number]    cc:

## 2021-03-15 NOTE — Preop H&P (Signed)
UPDATES TO PATIENT'S CONDITION on the DAY OF SURGERY/PROCEDURE    I. Updates to Patient's Condition (to be completed by a provider privileged to complete a H&P, following reassessment of the patient by the provider):    Day of Surgery/Procedure Update:  History  History reviewed and no change    Physical  Physical exam updated and no change            II. Procedure Readiness   I have reviewed the patient's H&P and updated condition. By completing and signing this form, I attest that this patient is ready for surgery/procedure.    III. Attestation   I have reviewed the updated information regarding the patient's condition and it is appropriate to proceed with the planned surgery/procedure.    I have reminded Mr. Rebert that COVID-19 is still present in our community. He was advised that [facility] and its affiliates have made deliberate and widespread changes to policies and procedures, consistent with applicable directives, in order to reduce the risk of exposure in our facilities.     I further explained and Mr. Stabenow understands that given the communicability of the SARS CoV2 coronavirus, there remains a small but real risk of contracting the disease while receiving perioperative care - even with stringent preventive measures in place.   Mr. Moreland understands the potential consequences of COVID-19 disease as it relates to their planned procedure and anticipated postoperative course.      The patient and I have considered and discussed the relative risks and benefits of proceeding with his/her surgery - both in terms of the procedure itself, and also in the context of the ongoing pandemic.  Mr. Zarazua wishes to proceed with the procedure.     Myrtice Lauth, MD as of 7:25 AM 03/15/2021

## 2021-03-15 NOTE — Anesthesia Postprocedure Evaluation (Signed)
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 [S62.641P] Surgeon(s):  Myrtice Lauth, MD  Laney Potash, MD Responsible Anesthesia Provider:  Rachel Bo, MD         Recovery Vitals  BP: (!) 140/95 (03/15/2021 11:00 AM)  Heart Rate: 57 (03/15/2021  7:32 AM)  Heart Rate (via Pulse Ox): 55 (03/15/2021 11:00 AM)  Resp: 16 (03/15/2021  7:32 AM)  Temp: 36 C (96.8 F) (03/15/2021 11:00 AM)  SpO2: 100 % (03/15/2021 11:00 AM)   0-10 Scale: 0 (03/15/2021 11:00 AM)    Anesthesia type:  general  Complications Noted During Procedure or in PACU:  None   Comment:    Patient Location:  PACU  Level of Consciousness:    Awake, alert and oriented  Patient Participation:     Able to participate  Temperature Status:    Normothermic  Oxygen Saturation:    Within patient's normal range  Cardiac Status:   Within patient's normal range  Fluid Status:    Stable  Airway Patency:     Yes  Pulmonary Status:    Baseline  Pain Management:    Adequate analgesia  Nausea and Vomiting:  None    Post Op Assessment:    Tolerated procedure well  Responsible Anesthesia Provider Attestation:  All indicated post anesthesia care provided       -

## 2021-03-15 NOTE — INTERIM OP NOTE (Signed)
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      Implant WIRE K DBL TROCAR END SHT SMTH .035X4IN - [id number] Used as Supply (not implanted)      Implant WIRE K DBL TROCAR END SHT SMTH .054X4IN - [id number] Used as Supply (not implanted)      Plate PLATE BONE TI STR 6 HL 1.5X29M - [id number] Implanted      Screw SCREW CORTEX SELF TAPPING 1.5MM X - [id number] Implanted      Screw SCREW BNE L14MM DIA1.5MM CORT HND SS ST NONLOCK LP FOR MOD SYS PRO PK - [id number] Implanted      Screw SCREW BNE L16MM DIA1.5MM CORT SS ST FT CRUCFRM RECESS FOR MOD MINI FRAG SYS - [id number] Implanted      Screw SCREW BNE L12MM DIA1.5MM CORT SS ST FT CRUCFRM RECESS FOR MOD MINI FRAG SYS - [id number] Implanted                 Packing:                 Patient Condition: good       Findings (Including unexpected complications): See op report     Signed:  Laney Potash, MD  on 03/15/2021 at 10:24 AM

## 2021-03-15 NOTE — Anesthesia Case Conclusion (Signed)
CASE CONCLUSION  Emergence  Criteria Used for Airway Removal:  Acceptable O2 saturation  Assessment:  Routine  Transport  Directly to: PACU  Airway:  Nasal cannula  Oxygen Delivery:  4 lpm  Position:  Recumbent  Patient Condition on Handoff  Level of Consciousness:  Agitated  Patient Condition:  Stable  Handoff Report to:  RN  Comments: Pt continuously trying to get out of bed to smoke a cigarette and "wants to go ride his dirtbike"  Pt oriented to place, but continues to fight to get out of bed.  Dr. Glenna Durand came to PACU bedside and additional medications given.  VSS.

## 2021-03-16 ENCOUNTER — Encounter: Payer: Self-pay | Admitting: Orthopedic Surgery

## 2021-03-16 ENCOUNTER — Encounter: Payer: Self-pay | Admitting: Internal Medicine

## 2021-03-17 ENCOUNTER — Encounter: Payer: Self-pay | Admitting: Orthopedic Surgery

## 2021-03-17 DIAGNOSIS — S62641P Nondisplaced fracture of proximal phalanx of left index finger, subsequent encounter for fracture with malunion: Secondary | ICD-10-CM

## 2021-03-17 MED ORDER — OXYCODONE HCL 5 MG PO TABS *I*
5.0000 mg | ORAL_TABLET | ORAL | 0 refills | Status: DC | PRN
Start: 2021-03-17 — End: 2022-03-15

## 2021-03-17 NOTE — Telephone Encounter (Signed)
I called and spoke to the patient. Patient sent Mychart message for refill of pain medications. Refill of 10 tablets sent to pharmacy. We discussed that this will be the last refill and to take as needed for pain. He has been taking ibuprofen and tylenol for pain. We discussed dosing of these two medications. All questions were invited and answered.

## 2021-03-20 ENCOUNTER — Ambulatory Visit: Payer: Medicaid Other | Admitting: Internal Medicine

## 2021-03-20 ENCOUNTER — Encounter: Payer: Self-pay | Admitting: Internal Medicine

## 2021-03-20 VITALS — BP 155/90 | HR 70 | Temp 97.9°F | Ht 70.98 in | Wt 208.8 lb

## 2021-03-20 DIAGNOSIS — M7581 Other shoulder lesions, right shoulder: Secondary | ICD-10-CM

## 2021-03-20 NOTE — Progress Notes (Signed)
[facility] Family Medicine - Office Visit    SUBJECTIVE    CHIEF COMPLAINT:   Chief Complaint   Patient presents with    Follow-up     Pain management for right shoulder.     Problems reviewed at this visit were:   1. Right rotator cuff tendonitis       S/p ORIF of left index finger on 5/4.    Right shoulder is still hurting. Had a fall recently which exacerbated the pain. Overhead motions are particularly painful. He's been taking OTC tylenol, ibuprofen. He intends to continue PT but he needed to miss appointments related to the fall and his surgery on the finger. He does not have a shoulder PT appointment scheduled. Interested in cortisone injection.       OBJECTIVE  BP 155/90 (BP Location: Right arm, Patient Position: Sitting, Cuff Size: adult)    Pulse 70    Temp 36.6 C (97.9 F) (Temporal)    Ht 1.803 m (5' 10.98") Comment: transcribed   Wt 94.7 kg (208 lb 12.4 oz)    BMI 29.13 kg/m   BP Readings from Last 3 Encounters:   03/20/21 155/90   03/15/21 (!) 140/95   02/24/21 (!) 138/95     Vitals: reviewed.  Gen: alert and appropriately conversant  R shoulder: Pain w/overhead motions.    MRI R shoulder March 2022:  1.The supraspinatus demonstrates an interstitial insertional tear abutting the insertion and measuring approximately 5 mm in length. There is mild to moderate tendinopathy throughout the remainder of the supraspinatus tendon. There is mild tendinopathy   of the infraspinatus but no frank tear. The teres minor and subscapularis tendons are unremarkable.  2. Examination of the bony structures demonstrates mild acromioclavicular joint hypertrophy which does efface the subacromial fat and impresses upon the musculotendinous junction of the supraspinatus suggestive of impingement but this should be   correlated clinically.        ASSESSMENT & PLAN    1. Right rotator cuff tendonitis       Verbal consent was obtained. The patient was positioned. The skin was prepped. Using Sterile technique, the right shoulder  was injected with 2cc lidocaine w/epi and 40mg  of triamcinolonel. The patient tolerated the injection without adverse effects. A bandage was placed.     Advised on importance on continuing PT.    F/U as scheduled.     , MD MPH    To my patients: My notes are dictated using voice recognition software. If there is a major error that needs to be corrected urgently, please contact me via MyChart. If there is a minor error, please discuss this with me at your next appointment.

## 2021-03-22 ENCOUNTER — Ambulatory Visit: Payer: Medicaid Other | Attending: Orthopedic Surgery | Admitting: Orthopedic Surgery

## 2021-03-22 ENCOUNTER — Ambulatory Visit: Payer: Medicaid Other | Attending: Orthopedic Surgery | Admitting: Rehabilitative and Restorative Service Providers"

## 2021-03-22 ENCOUNTER — Ambulatory Visit: Payer: Medicaid Other | Admitting: Internal Medicine

## 2021-03-22 ENCOUNTER — Encounter: Payer: Self-pay | Admitting: Orthopedic Surgery

## 2021-03-22 ENCOUNTER — Ambulatory Visit
Admission: RE | Admit: 2021-03-22 | Discharge: 2021-03-22 | Disposition: A | Discharge status: Home / Self Care | Payer: Medicaid Other | Source: Ambulatory Visit

## 2021-03-22 VITALS — BP 121/83 | HR 75 | Ht 71.0 in | Wt 208.0 lb

## 2021-03-22 DIAGNOSIS — S62641B Nondisplaced fracture of proximal phalanx of left index finger, initial encounter for open fracture: Secondary | ICD-10-CM

## 2021-03-22 DIAGNOSIS — M25511 Pain in right shoulder: Secondary | ICD-10-CM | POA: Insufficient documentation

## 2021-03-22 DIAGNOSIS — S62641D Nondisplaced fracture of proximal phalanx of left index finger, subsequent encounter for fracture with routine healing: Secondary | ICD-10-CM

## 2021-03-22 DIAGNOSIS — S62641P Nondisplaced fracture of proximal phalanx of left index finger, subsequent encounter for fracture with malunion: Secondary | ICD-10-CM

## 2021-03-22 NOTE — Progress Notes (Signed)
Hand and Upper Extremity Surgery      Chief Complaint:  [name] is a 35 y.o. 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     History of Present Illness/Interval History:  Patient presents for follow up. He is overall doing well. He is experiencing good days and bad days with pain. He has been alternating ibuprofen and tylenol for pain control.     Physical Exam:  Vital Signs: BP 121/83    Pulse 75    Ht 1.803 m (5\' 11" )    Wt 94.3 kg (208 lb)    BMI 29.01 kg/m   General Appearance: No acute distress. Alert and oriented to person, place, and time.  Mood and Affect: Normal    Left hand digits are warm and well perfused. Swelling globally to the left index finger. Incision is healing well without erythema, drainage, or evidence of infection. Sutures in place.     Imaging:  Left index finger xrays were personally reviewed and interpreted. Demonstrates s/p osteotomy of proximal phalanx, stable alignment. No hardware complications. Soft tissue swelling.     Assessment and Plan:  [name] is a 35 y.o. 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     He is overall doing well. He has a PT appointment following today's visit to begin working on range of motion.  No restrictions on range of motion at this time.  Discussed no weightbearing, lifting, pushing, pulling with the left hand.  Sutures in place. He is concerned as finger appears to still be ulnar deviated. Xrays were reviewed and discussed improved alignment of proximal phalanx is shown and will continue to monitor as he works on flexion and swelling decreases. Follow-up in 1 week for suture removal. All questions were invited and answered. Please call with any questions or concerns in the meantime.    [place], [place]  2:23 PM  03/22/2021
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
## 2021-03-30 ENCOUNTER — Encounter: Payer: Self-pay | Admitting: Orthopedic Surgery

## 2021-03-30 ENCOUNTER — Ambulatory Visit: Payer: Medicaid Other | Attending: Orthopedic Surgery | Admitting: Orthopedic Surgery

## 2021-03-30 ENCOUNTER — Ambulatory Visit
Admission: RE | Admit: 2021-03-30 | Discharge: 2021-03-30 | Disposition: A | Discharge status: Home / Self Care | Payer: Medicaid Other | Source: Ambulatory Visit

## 2021-03-30 ENCOUNTER — Ambulatory Visit: Payer: Medicaid Other | Admitting: Rehabilitative and Restorative Service Providers"

## 2021-03-30 VITALS — BP 140/89 | HR 66 | Ht 71.0 in | Wt 207.0 lb

## 2021-03-30 DIAGNOSIS — S62651D Nondisplaced fracture of medial phalanx of left index finger, subsequent encounter for fracture with routine healing: Secondary | ICD-10-CM

## 2021-03-30 DIAGNOSIS — S62641P Nondisplaced fracture of proximal phalanx of left index finger, subsequent encounter for fracture with malunion: Secondary | ICD-10-CM | POA: Insufficient documentation

## 2021-03-30 NOTE — Progress Notes (Signed)
Hand and Upper Extremity Surgery      Chief Complaint:  [name] is a 35 y.o. 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    History of Present Illness/Interval History:  Patient presents for follow-up.  Patient is overall doing well. He has been working with PT and working on exercises at home. Pain is controlled.    Physical Exam:  Vital Signs: BP 140/89    Pulse 66    Ht 1.803 m (5\' 11" ) Comment: stated   Wt 93.9 kg (207 lb) Comment: stated   BMI 28.87 kg/m   General Appearance: No acute distress. Alert and oriented to person, place, and time.  Mood and Affect: Normal    Left hand digits are warm and well perfused. Swelling globally to the left index finger. Incision is healing well without erythema, drainage, or evidence of infection. Slight superficial opening of the skin along the proximal aspect of the incision. Sutures in place. Flexion of the PIP joint of the index finger to approximately 35 degrees. Flexion of the MP joint of the index finger to approximately 50 degrees.     Imaging:  Left index finger xrays were personally reviewed and interpreted. Demonstrates s/p osteotomy of proximal phalanx, stable alignment. Healing noted. No hardware complications. Soft tissue swelling present.    Assessment and Plan:  [name] is a 35 y.o. 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 He has a PT appointment following today's visit to continue to work on range of motion. No restrictions on range of motion. No weightbearing, lifting, pushing, pulling with the left hand. We will plan on keeping the sutures in for 5-7 days. Plan discussed with hand therapy on suture removal in approximately 5 days. Follow up in one month with Dr. 05/15/2021. All questions were invited and answered. Please call with any questions or concerns.    [place], [place]  2:29  PM  03/30/2021

## 2021-04-04 ENCOUNTER — Ambulatory Visit: Payer: Medicaid Other | Admitting: Rehabilitative and Restorative Service Providers"

## 2021-04-04 DIAGNOSIS — S62641B Nondisplaced fracture of proximal phalanx of left index finger, initial encounter for open fracture: Secondary | ICD-10-CM

## 2021-04-04 NOTE — Progress Notes (Signed)
[facility] Orthopedic Hand / Upper Extremity Rehabilitation Treatment Note    Todays Date: 04/04/2021    Name: [name]  DOB: 06/05/1986  Referring Physician: Amanda Cockayne, NP  Diagnosis:   1. S/p R index finger I&D, pinning of proximal phalanx fx 01/25/20         Visit #: 9    Subjective:  Swollen     Objective:  Left Index Finger   MP 0/50   PIP 20/50   DIP 20/40        Treatment:  Moist heat    Assessment:   Functional activities were tolerated today.  Still has some moderate swelling       Plan of Care: Progress as indicated in 1 week.     Thank you for referring this patient to [facility] Orthopaedics - Hand and Upper Extremity Rehabilitation    Edwyna Perfect, PT, CHT    MH X   Removed sutures X   half fist on board 2x10 ea   CPM finger flexion 15 min   digilfex 20 X   Digiextend 20 X   BTE RD/UD with hook grip 4 lb

## 2021-04-11 ENCOUNTER — Ambulatory Visit: Payer: Medicaid Other | Admitting: Rehabilitative and Restorative Service Providers"

## 2021-04-12 ENCOUNTER — Encounter: Payer: Self-pay | Admitting: Physical Therapy

## 2021-04-12 ENCOUNTER — Encounter: Payer: Self-pay | Admitting: Rehabilitative and Restorative Service Providers"

## 2021-04-12 ENCOUNTER — Encounter: Payer: Self-pay | Admitting: Gastroenterology

## 2021-04-17 ENCOUNTER — Ambulatory Visit: Payer: Medicaid Other | Admitting: Rehabilitative and Restorative Service Providers"

## 2021-04-18 ENCOUNTER — Ambulatory Visit: Payer: Medicaid Other | Attending: Family Medicine | Admitting: Physical Therapy

## 2021-04-18 DIAGNOSIS — M25511 Pain in right shoulder: Secondary | ICD-10-CM | POA: Insufficient documentation

## 2021-04-18 DIAGNOSIS — S62641B Nondisplaced fracture of proximal phalanx of left index finger, initial encounter for open fracture: Secondary | ICD-10-CM | POA: Insufficient documentation

## 2021-04-18 NOTE — Progress Notes (Signed)
[facility] Orthopedic Hand / Upper Extremity Rehabilitation Treatment Note    Todays Date: 04/18/2021    Name: [name]  DOB: 09-Aug-1986  Referring Physician: Amanda Cockayne, NP  Diagnosis:   1. S/p R index finger I&D, pinning of proximal phalanx fx 01/25/20     2. Acute pain of right shoulder         Visit #: 2    Subjective:  6/7  Pt states he's been busy. He previoulsy received a cortisone injection which helpd for some time, but the pain is still lingering. Due to being busy, he has not had much time to get around to his shoulder HEP.    The finger is still swollen and still feels numb and stiff. He arrives with coban wrapping and has been working on regaining ROM.  He has been doing composite fist and isolated IP flexion exercises. He does have some discomfort/sensitivity by the scar on his finger.      Objective:  Left Index Finger   MP 0/64   PIP 20/50   DIP 20/50        Treatment:  Moist heat    Assessment:   Pt continuing to make improvements. Continued with focus primarily on restoring motion to the hand. Reviewed previous shoulder exercises that pt may return to as tolerated, for now also focusing on ROM.       Plan of Care: Progress as indicated in 1 week.     Thank you for referring this patient to [facility] Orthopaedics - Hand and Upper Extremity Rehabilitation    Biagio Borg, DPT    MH X       half fist on board 2x10 ea   CPM finger flexion NDT   digiflex 20 X   Digiextend 20 X   BTE RD/UD with hook grip NDT   PROM and passive stretching for finger flexion 15 min   Finger opposition 2x20   Wall and table slides for shoulder flexion, scaption x15 ea

## 2021-04-19 ENCOUNTER — Telehealth: Payer: Self-pay

## 2021-04-19 ENCOUNTER — Encounter: Payer: Self-pay | Admitting: Internal Medicine

## 2021-04-19 DIAGNOSIS — M7581 Other shoulder lesions, right shoulder: Secondary | ICD-10-CM

## 2021-04-19 MED ORDER — MELOXICAM 15 MG PO TABS *I*
15.0000 mg | ORAL_TABLET | Freq: Every day | ORAL | 1 refills | Status: DC
Start: 2021-04-19 — End: 2022-03-15

## 2021-04-19 NOTE — Telephone Encounter (Signed)
Called [name] to correct message about taking ibuprofen with meloxicam to tell him not to take them together as they are similar medications.

## 2021-04-19 NOTE — Telephone Encounter (Signed)
Initial Evaluation form faxed. Placed in scanning.

## 2021-04-20 NOTE — Progress Notes (Signed)
[facility] Family Medicine - Office Visit    SUBJECTIVE    CHIEF COMPLAINT:   Chief Complaint   Patient presents with    Shoulder Pain     Right shoulder. Follow-up.     Problems reviewed at this visit were:   1. Right rotator cuff tendonitis    2. Seasonal allergies       Last seen May 9. CSI to R shoulder at that time.    1. Right shoulder pain. He continues PT and meloxicam.    2. Seasonal allergies. The pollen is making his eyes puffy and causing him to sneeze. Sometimes he takes claritin.    Answers for HPI/ROS submitted by the patient on 04/17/2021  Are you experiencing pain as your primary problem for this visit?: Yes  result of injury: Yes  Onset: more than 2 weeks ago  Pain location: Rt Shoulder  pain level: 7/10  pain progression: worsening  Pain frequency: continuously  Comment: Pain came back       OBJECTIVE  BP (!) 126/93 (BP Location: Right arm, Patient Position: Sitting, Cuff Size: adult)    Pulse 60    Temp 36.8 C (98.3 F) (Temporal)    Ht 1.803 m (5' 10.98") Comment: transcribed   Wt 93.9 kg (207 lb)    BMI 28.88 kg/m   BP Readings from Last 3 Encounters:   04/24/21 (!) 126/93   03/30/21 140/89   03/22/21 121/83     Vitals: reviewed.  Gen: alert and appropriately conversant       ASSESSMENT & PLAN    1. Right rotator cuff tendonitis  Continue PT. Consider MRI if no improvement after 6 weeks of PT.   2. Seasonal allergies  fluticasone (FLONASE) 50 MCG/ACT nasal spray    loratadine (CLARITIN) 10 mg tablet    azelastine (OPTIVAR) 0.05 % ophthalmic solution     Follow up if symptoms worsen or fail to improve.     Santina Evans, MD MPH    To my patients: My notes are dictated using voice recognition software. If there is a major error that needs to be corrected urgently, please contact me via MyChart. If there is a minor error, please discuss this with me at your next appointment.

## 2021-04-21 ENCOUNTER — Ambulatory Visit: Payer: Medicaid Other | Admitting: Orthopedic Surgery

## 2021-04-21 DIAGNOSIS — S62641P Nondisplaced fracture of proximal phalanx of left index finger, subsequent encounter for fracture with malunion: Secondary | ICD-10-CM

## 2021-04-24 ENCOUNTER — Encounter: Payer: Self-pay | Admitting: Internal Medicine

## 2021-04-24 ENCOUNTER — Ambulatory Visit: Payer: Medicaid Other | Admitting: Rehabilitative and Restorative Service Providers"

## 2021-04-24 ENCOUNTER — Ambulatory Visit: Payer: Medicaid Other | Admitting: Internal Medicine

## 2021-04-24 VITALS — BP 126/93 | HR 60 | Temp 98.3°F | Ht 70.98 in | Wt 207.0 lb

## 2021-04-24 DIAGNOSIS — M7581 Other shoulder lesions, right shoulder: Secondary | ICD-10-CM

## 2021-04-24 DIAGNOSIS — J302 Other seasonal allergic rhinitis: Secondary | ICD-10-CM

## 2021-04-24 MED ORDER — AZELASTINE HCL 0.05 % OP SOLN *I*
1.0000 [drp] | Freq: Two times a day (BID) | OPHTHALMIC | 5 refills | Status: DC
Start: 2021-04-24 — End: 2022-03-23

## 2021-04-24 MED ORDER — FLUTICASONE PROPIONATE 50 MCG/ACT NA SUSP *I*
1.0000 | Freq: Every day | NASAL | 1 refills | Status: DC
Start: 2021-04-24 — End: 2021-04-28

## 2021-04-24 MED ORDER — LORATADINE 10 MG PO TABS *I*
10.0000 mg | ORAL_TABLET | Freq: Every day | ORAL | 5 refills | Status: AC
Start: 2021-04-24 — End: 2021-10-21

## 2021-04-25 ENCOUNTER — Ambulatory Visit: Payer: Medicaid Other | Admitting: Physical Therapy

## 2021-04-25 ENCOUNTER — Ambulatory Visit: Payer: Medicaid Other | Admitting: Orthopedic Surgery

## 2021-04-25 ENCOUNTER — Encounter: Payer: Self-pay | Admitting: Internal Medicine

## 2021-04-25 DIAGNOSIS — S62641B Nondisplaced fracture of proximal phalanx of left index finger, initial encounter for open fracture: Secondary | ICD-10-CM

## 2021-04-25 DIAGNOSIS — M25511 Pain in right shoulder: Secondary | ICD-10-CM

## 2021-04-25 NOTE — Progress Notes (Signed)
[facility] Orthopedic Hand / Upper Extremity Rehabilitation Treatment Note    Todays Date: 04/25/2021    Name: [name]  DOB: [birth year]  Referring Physician: Amanda Cockayne, NP  Diagnosis:   1. S/p R index finger I&D, pinning of proximal phalanx fx 01/25/20     2. Acute pain of right shoulder         Visit #: 3    Subjective:  6/14  Pt states he is doing all right and continuing to work on active and passive motion as tolerated.      Objective:  Left Index Finger   MP 0/68   PIP 20/50   DIP 18/52        Treatment:  Moist heat    Assessment:   Pt making steady progress in recovering motion for the finger. Reviewed shoulder strengthening, progressing to green band and making modifications for proper form.        Plan of Care: Progress as indicated in 1 week.     Thank you for referring this patient to [facility] Orthopaedics - Hand and Upper Extremity Rehabilitation    Biagio Borg, DPT    MH X       half fist on board 2x10 ea   CPM finger flexion NDT   digiflex 20 X   Digiextend 20 X   BTE RD/UD with hook grip NDT   PROM and passive stretching for finger flexion, STM.IASTM for scar mobility 15 min   Finger opposition 2x20   Wall and table slides for shoulder flexion, scaption x15 ea   Orange band ER x10   Green band ER 2x15   Green band IR pulldown 2x15

## 2021-04-26 NOTE — Telephone Encounter (Signed)
Can you start and put in my box?  Similar form was filled out in April by Hendricks Limes and can be found in media.    Thanks!    Santina Evans, MD MPH

## 2021-04-28 ENCOUNTER — Telehealth: Payer: Self-pay | Admitting: Internal Medicine

## 2021-04-28 DIAGNOSIS — J302 Other seasonal allergic rhinitis: Secondary | ICD-10-CM

## 2021-04-28 MED ORDER — BUDESONIDE 32 MCG/ACT NA SUSP *I*
1.0000 | Freq: Every day | NASAL | 1 refills | Status: DC
Start: 2021-04-28 — End: 2022-03-23

## 2021-04-28 NOTE — Telephone Encounter (Signed)
1. Seasonal allergies  - budesonide (RHINOCORT AQUA) 32 MCG/ACT nasal spray; Spray 1 spray into nostril daily  Dispense: 8.6 g; Refill: 1    This is a similar nasal spray. Perhaps it will be covered. But it might not. Both fluticasone and budesonide are OTC, so he may need to pay for whichever one.    [address] RN: Can you let him know I sent in an alternative?    Santina Evans, MD MPH

## 2021-04-28 NOTE — Telephone Encounter (Signed)
Pharmacy states medication fluticasone (FLONASE) 50 MCG/ACT nasal spray is not covered and is considering alternative.

## 2021-05-02 ENCOUNTER — Ambulatory Visit: Payer: Medicaid Other | Admitting: Physical Therapy

## 2021-05-02 NOTE — Telephone Encounter (Signed)
Placed form in Dr. Smilnak's box for review and sig

## 2021-05-03 NOTE — Telephone Encounter (Signed)
Faxed Medical Cert Form to [facility] @ [phone]... copy sent to scanning

## 2021-05-03 NOTE — Telephone Encounter (Signed)
FYI, signed this just now & returned to forms box.  Thanks!  Tim

## 2021-05-04 ENCOUNTER — Other Ambulatory Visit: Payer: Self-pay | Admitting: Internal Medicine

## 2021-05-04 ENCOUNTER — Telehealth: Payer: Self-pay

## 2021-05-04 DIAGNOSIS — J302 Other seasonal allergic rhinitis: Secondary | ICD-10-CM

## 2021-05-04 MED ORDER — FLUTICASONE PROPIONATE 50 MCG/ACT NA SUSP *I*
1.0000 | Freq: Every day | NASAL | 6 refills | Status: DC
Start: 2021-05-04 — End: 2022-03-23

## 2021-05-04 NOTE — Telephone Encounter (Signed)
Patient showed ID and picked up form.

## 2021-05-04 NOTE — Telephone Encounter (Signed)
Pt has had hand surgery and shoulder is still bothering him  He is back to pt for shoulder  Will see ortho family med faculty tomorrow-to evaluate

## 2021-05-04 NOTE — Telephone Encounter (Signed)
Pharmacy states that patient insurance will not cover medication  an alternative is requested

## 2021-05-05 ENCOUNTER — Ambulatory Visit: Payer: Medicaid Other | Admitting: Sports Medicine

## 2021-05-05 ENCOUNTER — Encounter: Payer: Self-pay | Admitting: Sports Medicine

## 2021-05-05 VITALS — BP 132/82 | HR 77 | Temp 97.6°F | Ht 70.98 in | Wt 211.0 lb

## 2021-05-05 DIAGNOSIS — M7581 Other shoulder lesions, right shoulder: Secondary | ICD-10-CM
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
## 2021-05-08 ENCOUNTER — Telehealth: Payer: Self-pay

## 2021-05-08 NOTE — Telephone Encounter (Signed)
I am confused.    Is anything needed from me?    Santina Evans, MD MPH

## 2021-05-08 NOTE — Telephone Encounter (Signed)
Phone call to patient who stated he is all set.

## 2021-05-08 NOTE — Telephone Encounter (Signed)
Phone call and checked with Antar.  He is currently set with forms.

## 2021-05-08 NOTE — Telephone Encounter (Signed)
Thank you both for your help.    Santina Evans: At your convenience, can you call the patient to see what he needs? If he needs an updated RTW date, please inquire as to what he thinks is reasonable.  - If a new form needs to be completed, please task this to Sue Lush to get the form started with updated RTW date information.    Santina Evans, MD MPH

## 2021-05-09 ENCOUNTER — Ambulatory Visit: Payer: Medicaid Other | Admitting: Physical Therapy

## 2021-05-09 NOTE — Progress Notes (Deleted)
[facility] Orthopedic Hand / Upper Extremity Rehabilitation Treatment Note    Todays Date: 05/09/2021    Name: [name]  DOB: [birth year]  Referring Physician: Amanda Cockayne, NP  Diagnosis:   No diagnosis found.    Visit #: Visit count could not be calculated. Make sure you are using a visit which is associated with an episode.    Subjective:  6/14  Pt states he is doing all right and continuing to work on active and passive motion as tolerated.      Objective:  Left Index Finger   MP 0/68   PIP 20/50   DIP 18/52        Treatment:  Moist heat    Assessment:   Pt making steady progress in recovering motion for the finger. Reviewed shoulder strengthening, progressing to green band and making modifications for proper form.        Plan of Care: Progress as indicated in 1 week.     Thank you for referring this patient to [facility] Orthopaedics - Hand and Upper Extremity Rehabilitation    Biagio Borg, DPT    MH X       half fist on board 2x10 ea   CPM finger flexion NDT   digiflex 20 X   Digiextend 20 X   BTE RD/UD with hook grip NDT   PROM and passive stretching for finger flexion, STM.IASTM for scar mobility 15 min   Finger opposition 2x20   Wall and table slides for shoulder flexion, scaption x15 ea   Orange band ER x10   Green band ER 2x15   Green band IR pulldown 2x15

## 2021-05-11 ENCOUNTER — Telehealth: Payer: Self-pay

## 2021-05-11 ENCOUNTER — Ambulatory Visit: Payer: Medicaid Other | Admitting: Internal Medicine

## 2021-05-11 NOTE — Telephone Encounter (Signed)
LM regarding appt with Dr Melina Copa.

## 2021-06-16 ENCOUNTER — Ambulatory Visit: Payer: Medicaid Other | Admitting: Primary Care

## 2022-03-11 ENCOUNTER — Emergency Department: Payer: Medicaid Other

## 2022-03-11 ENCOUNTER — Encounter: Payer: Self-pay | Admitting: Student in an Organized Health Care Education/Training Program

## 2022-03-11 ENCOUNTER — Inpatient Hospital Stay
Admission: EM | Admit: 2022-03-11 | Discharge: 2022-03-15 | DRG: 313 | Disposition: A | Discharge status: Home / Self Care | Payer: Medicaid Other | Source: Ambulatory Visit | Attending: Orthopedic Surgery | Admitting: Orthopedic Surgery

## 2022-03-11 ENCOUNTER — Other Ambulatory Visit: Payer: Self-pay

## 2022-03-11 DIAGNOSIS — W3400XA Accidental discharge from unspecified firearms or gun, initial encounter: Principal | ICD-10-CM

## 2022-03-11 DIAGNOSIS — S82401A Unspecified fracture of shaft of right fibula, initial encounter for closed fracture: Secondary | ICD-10-CM

## 2022-03-11 DIAGNOSIS — I498 Other specified cardiac arrhythmias: Secondary | ICD-10-CM

## 2022-03-11 DIAGNOSIS — M25571 Pain in right ankle and joints of right foot: Secondary | ICD-10-CM

## 2022-03-11 DIAGNOSIS — Z01818 Encounter for other preprocedural examination: Secondary | ICD-10-CM

## 2022-03-11 DIAGNOSIS — Y249XXA Unspecified firearm discharge, undetermined intent, initial encounter: Secondary | ICD-10-CM

## 2022-03-11 DIAGNOSIS — R2689 Other abnormalities of gait and mobility: Secondary | ICD-10-CM

## 2022-03-11 DIAGNOSIS — M791 Myalgia, unspecified site: Secondary | ICD-10-CM

## 2022-03-11 DIAGNOSIS — F17210 Nicotine dependence, cigarettes, uncomplicated: Secondary | ICD-10-CM | POA: Diagnosis present

## 2022-03-11 DIAGNOSIS — Z789 Other specified health status: Secondary | ICD-10-CM

## 2022-03-11 DIAGNOSIS — Y92480 Sidewalk as the place of occurrence of the external cause: Secondary | ICD-10-CM

## 2022-03-11 DIAGNOSIS — M25471 Effusion, right ankle: Secondary | ICD-10-CM

## 2022-03-11 DIAGNOSIS — T797XXA Traumatic subcutaneous emphysema, initial encounter: Secondary | ICD-10-CM

## 2022-03-11 DIAGNOSIS — S82391A Other fracture of lower end of right tibia, initial encounter for closed fracture: Secondary | ICD-10-CM

## 2022-03-11 DIAGNOSIS — S82251B Displaced comminuted fracture of shaft of right tibia, initial encounter for open fracture type I or II: Secondary | ICD-10-CM

## 2022-03-11 DIAGNOSIS — J45909 Unspecified asthma, uncomplicated: Secondary | ICD-10-CM | POA: Diagnosis present

## 2022-03-11 DIAGNOSIS — Z20822 Contact with and (suspected) exposure to covid-19: Secondary | ICD-10-CM | POA: Diagnosis present

## 2022-03-11 DIAGNOSIS — Y9389 Activity, other specified: Secondary | ICD-10-CM

## 2022-03-11 DIAGNOSIS — S82301B Unspecified fracture of lower end of right tibia, initial encounter for open fracture type I or II: Principal | ICD-10-CM | POA: Diagnosis present

## 2022-03-11 DIAGNOSIS — S60512A Abrasion of left hand, initial encounter: Secondary | ICD-10-CM

## 2022-03-11 DIAGNOSIS — S91031A Puncture wound without foreign body, right ankle, initial encounter: Secondary | ICD-10-CM

## 2022-03-11 DIAGNOSIS — S82209A Unspecified fracture of shaft of unspecified tibia, initial encounter for closed fracture: Secondary | ICD-10-CM

## 2022-03-11 LAB — CBC AND DIFFERENTIAL
Baso # K/uL: 0.3 10*3/uL — ABNORMAL HIGH (ref 0.0–0.1)
Basophil %: 1.8 %
Eos # K/uL: 0.3 10*3/uL (ref 0.0–0.5)
Eosinophil %: 1.8 %
Hematocrit: 45 % (ref 40–51)
Hemoglobin: 15.5 g/dL (ref 13.7–17.5)
Lymph # K/uL: 7 10*3/uL — ABNORMAL HIGH (ref 1.3–3.6)
Lymphocyte %: 46 %
MCH: 28 pg (ref 26–32)
MCHC: 34 g/dL (ref 32–37)
MCV: 80 fL (ref 79–92)
Mono # K/uL: 0.3 10*3/uL (ref 0.3–0.8)
Monocyte %: 1.8 %
Neut # K/uL: 7.5 10*3/uL — ABNORMAL HIGH (ref 1.8–5.4)
Nucl RBC # K/uL: 0 10*3/uL (ref 0.0–0.0)
Nucl RBC %: 0.1 /100 WBC (ref 0.0–0.2)
Platelets: 324 10*3/uL (ref 150–330)
RBC: 5.6 MIL/uL (ref 4.6–6.1)
RDW: 14.5 % — ABNORMAL HIGH (ref 11.6–14.4)
Seg Neut %: 48.6 %
WBC: 15.2 10*3/uL — ABNORMAL HIGH (ref 4.2–9.1)

## 2022-03-11 LAB — COVID/INFLUENZA A & B/RSV NAAT (PCR)
COVID-19 NAAT (PCR): NEGATIVE
Influenza A NAAT (PCR): NEGATIVE
Influenza B NAAT (PCR): NEGATIVE
RSV NAAT (PCR): NEGATIVE

## 2022-03-11 LAB — BASIC METABOLIC PANEL
Anion Gap: 23 — ABNORMAL HIGH (ref 7–16)
CO2: 17 mmol/L — ABNORMAL LOW (ref 20–28)
Calcium: 10 mg/dL (ref 9.0–10.3)
Chloride: 102 mmol/L (ref 96–108)
Creatinine: 1.16 mg/dL (ref 0.67–1.17)
Glucose: 125 mg/dL — ABNORMAL HIGH (ref 60–99)
Lab: 13 mg/dL (ref 6–20)
Potassium: 4.2 mmol/L (ref 3.3–5.1)
Sodium: 142 mmol/L (ref 133–145)
eGFR BY CREAT: 83 *

## 2022-03-11 LAB — HOLD GREEN WITH GEL

## 2022-03-11 LAB — TYPE AND SCREEN
ABO RH Blood Type: B POS
Antibody Screen: NEGATIVE

## 2022-03-11 LAB — PROTIME-INR
INR: 1.1 (ref 0.9–1.1)
Protime: 12.1 s (ref 10.0–12.9)

## 2022-03-11 LAB — DIFF MANUAL: Diff Based On: 113 CELLS

## 2022-03-11 LAB — HOLD BLUE

## 2022-03-11 LAB — PERFORMING LAB

## 2022-03-11 LAB — HOLD GRAY

## 2022-03-11 MED ORDER — FENTANYL CITRATE 50 MCG/ML IJ SOLN *WRAPPED*
INTRAMUSCULAR | Status: AC
Start: 2022-03-11 — End: 2022-03-11
  Administered 2022-03-11: 100 ug via INTRAVENOUS
  Filled 2022-03-11: qty 2

## 2022-03-11 MED ORDER — TETANUS-DIPHTH-ACELL PERT 5-2.5-18.5 LF-MCG/0.5 IM SUSP *WRAPPED*
0.5000 mL | Freq: Once | INTRAMUSCULAR | Status: AC
Start: 2022-03-11 — End: 2022-03-11

## 2022-03-11 MED ORDER — SODIUM CHLORIDE 0.9 % FLUSH FOR PUMPS *I*
0.0000 mL/h | INTRAVENOUS | Status: DC | PRN
Start: 2022-03-11 — End: 2022-03-12

## 2022-03-11 MED ORDER — SENNOSIDES 8.6 MG PO TABS *I*
2.0000 | ORAL_TABLET | Freq: Every day | ORAL | Status: DC
Start: 2022-03-12 — End: 2022-03-12

## 2022-03-11 MED ORDER — ONDANSETRON HCL 2 MG/ML IV SOLN *I*
4.0000 mg | Freq: Four times a day (QID) | INTRAMUSCULAR | Status: DC | PRN
Start: 2022-03-11 — End: 2022-03-12

## 2022-03-11 MED ORDER — OXYCODONE HCL 10 MG PO TABS *I*
10.0000 mg | ORAL_TABLET | ORAL | Status: DC | PRN
Start: 2022-03-11 — End: 2022-03-12
  Administered 2022-03-11 – 2022-03-12 (×4): 10 mg via ORAL
  Filled 2022-03-11 (×4): qty 1

## 2022-03-11 MED ORDER — ACETAMINOPHEN 500 MG PO TABS *I*
1000.0000 mg | ORAL_TABLET | Freq: Three times a day (TID) | ORAL | Status: DC
Start: 2022-03-11 — End: 2022-03-12
  Administered 2022-03-11 – 2022-03-12 (×2): 1000 mg via ORAL
  Filled 2022-03-11 (×2): qty 2

## 2022-03-11 MED ORDER — SODIUM CHLORIDE 0.9 % 25 ML IV SOLN *I*
5.0000 mg | Freq: Four times a day (QID) | INTRAVENOUS | Status: DC | PRN
Start: 2022-03-11 — End: 2022-03-15

## 2022-03-11 MED ORDER — HYDROMORPHONE HCL PF 1 MG/ML IJ SOLN *WRAPPED*
1.0000 mg | Freq: Once | INTRAMUSCULAR | Status: AC
Start: 2022-03-11 — End: 2022-03-11
  Administered 2022-03-11: 1 mg via INTRAVENOUS
  Filled 2022-03-11: qty 1

## 2022-03-11 MED ORDER — HYDROMORPHONE HCL PF 1 MG/ML IJ SOLN *WRAPPED*
INTRAMUSCULAR | Status: AC
Start: 2022-03-11 — End: 2022-03-11
  Administered 2022-03-11: 1 mg via INTRAVENOUS
  Filled 2022-03-11: qty 1

## 2022-03-11 MED ORDER — CEFAZOLIN 2000 MG IN STERILE WATER 20ML SYRINGE *I*
PREFILLED_SYRINGE | INTRAVENOUS | Status: AC
Start: 2022-03-11 — End: 2022-03-11
  Administered 2022-03-11: 2000 mg via INTRAVENOUS
  Filled 2022-03-11: qty 20

## 2022-03-11 MED ORDER — POLYETHYLENE GLYCOL 3350 PO PACK 17 GM *I*
17.0000 g | PACK | Freq: Every day | ORAL | Status: DC
Start: 2022-03-12 — End: 2022-03-12

## 2022-03-11 MED ORDER — OXYCODONE HCL 5 MG PO TABS *I*
5.0000 mg | ORAL_TABLET | ORAL | Status: DC | PRN
Start: 2022-03-11 — End: 2022-03-12

## 2022-03-11 MED ORDER — HYDROMORPHONE HCL PF 1 MG/ML IJ SOLN *WRAPPED*
1.0000 mg | Freq: Once | INTRAMUSCULAR | Status: AC
Start: 2022-03-11 — End: 2022-03-11

## 2022-03-11 MED ORDER — DEXTROSE 5 % FLUSH FOR PUMPS *I*
0.0000 mL/h | INTRAVENOUS | Status: DC | PRN
Start: 2022-03-11 — End: 2022-03-12

## 2022-03-11 MED ORDER — CEFAZOLIN 2000 MG IN STERILE WATER 20ML SYRINGE *I*
2000.0000 mg | PREFILLED_SYRINGE | Freq: Three times a day (TID) | INTRAVENOUS | Status: DC
Start: 2022-03-12 — End: 2022-03-12
  Administered 2022-03-12 (×2): 2000 mg via INTRAVENOUS
  Filled 2022-03-11 (×4): qty 20

## 2022-03-11 MED ORDER — CEFAZOLIN 2000 MG IN STERILE WATER 20ML SYRINGE *I*
2000.0000 mg | PREFILLED_SYRINGE | Freq: Once | INTRAVENOUS | Status: AC
Start: 2022-03-11 — End: 2022-03-11

## 2022-03-11 MED ORDER — LACTATED RINGERS IV SOLN *I*
100.0000 mL/h | INTRAVENOUS | Status: DC
Start: 2022-03-12 — End: 2022-03-12
  Administered 2022-03-12: 100 mL/h via INTRAVENOUS

## 2022-03-11 MED ORDER — HYDROMORPHONE HCL PF 1 MG/ML IJ SOLN *WRAPPED*
0.5000 mg | INTRAMUSCULAR | Status: DC | PRN
Start: 2022-03-11 — End: 2022-03-12
  Administered 2022-03-12 (×3): 0.5 mg via INTRAVENOUS
  Filled 2022-03-11 (×3): qty 0.5

## 2022-03-11 MED ORDER — ONDANSETRON HCL 2 MG/ML IV SOLN *I*
4.0000 mg | INTRAMUSCULAR | Status: DC | PRN
Start: 2022-03-11 — End: 2022-03-11

## 2022-03-11 MED ORDER — TETANUS-DIPHTH-ACELL PERT 5-2.5-18.5 LF-MCG/0.5 IM SUSP *WRAPPED*
INTRAMUSCULAR | Status: AC
Start: 2022-03-11 — End: 2022-03-11
  Administered 2022-03-11: 0.5 mL via INTRAMUSCULAR
  Filled 2022-03-11: qty 0.5

## 2022-03-11 MED ORDER — FENTANYL CITRATE 50 MCG/ML IJ SOLN *WRAPPED*
100.0000 ug | Freq: Once | INTRAMUSCULAR | Status: AC
Start: 2022-03-11 — End: 2022-03-11

## 2022-03-11 MED ORDER — NALOXONE HCL 0.4 MG/ML IJ SOLN *WRAPPED*
0.1000 mg | Status: DC | PRN
Start: 2022-03-11 — End: 2022-03-12

## 2022-03-11 NOTE — Progress Notes (Signed)
Brief Orthopaedic Surgery Progress Note     Compartment check performed at 2200. Patient resting comfortably. Pain well controlled. Denies new numbness or tingling in the RLE.      Exam RLE:      - Splint C/D/I  - No pain with passive flexion/extension of toes  - SILT over exposed toes and 1st DWS   - Active toe flexion/ extension  - capillary refill < 3 seconds        A/P:       Low suspicion for compartment syndrome at this time. Will continue to monitor.  - Please page Orthopaedic resident on-call if there is any increase in concern for compartment syndrome, specifically tense compartments, excruciating pain with passive range of motion, and increasing requirement for IV narcotics     Roger Shelter, MD  Orthopaedic Surgery Resident  10:18 PM, 03/11/2022
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
## 2022-03-11 NOTE — ED Notes (Deleted)
Can move out - OR?
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
## 2022-03-11 NOTE — ED Notes (Signed)
 of Fentanyl given at this time by Sheppard Plumber RN.
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
## 2022-03-11 NOTE — ED Notes (Signed)
2 grams of ancef given at this time by Sheppard Plumber RN.
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
## 2022-03-11 NOTE — ED Notes (Signed)
TDAP given at this time in RUE by Heywood Footman RN.
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
## 2022-03-11 NOTE — Bed Hold Note (Signed)
Bed: AC-13R  Expected date:   Expected time:   Means of arrival:   Comments:  65L

## 2022-03-12 ENCOUNTER — Inpatient Hospital Stay: Payer: Medicaid Other

## 2022-03-12 ENCOUNTER — Inpatient Hospital Stay: Payer: Medicaid Other | Admitting: Anesthesiology

## 2022-03-12 ENCOUNTER — Encounter: Payer: Self-pay | Admitting: Gastroenterology

## 2022-03-12 ENCOUNTER — Encounter
Admission: EM | Disposition: A | Discharge status: Home / Self Care | Payer: Self-pay | Source: Ambulatory Visit | Attending: Orthopedic Surgery

## 2022-03-12 DIAGNOSIS — I498 Other specified cardiac arrhythmias: Secondary | ICD-10-CM

## 2022-03-12 DIAGNOSIS — S82201A Unspecified fracture of shaft of right tibia, initial encounter for closed fracture: Secondary | ICD-10-CM

## 2022-03-12 DIAGNOSIS — S82301B Unspecified fracture of lower end of right tibia, initial encounter for open fracture type I or II: Secondary | ICD-10-CM

## 2022-03-12 DIAGNOSIS — Z789 Other specified health status: Secondary | ICD-10-CM

## 2022-03-12 LAB — MRSA NAAT (PCR): MRSA NAAT (PCR): NEGATIVE

## 2022-03-12 LAB — CBC AND DIFFERENTIAL
Baso # K/uL: 0.1 10*3/uL (ref 0.0–0.1)
Basophil %: 0.5 %
Eos # K/uL: 0.2 10*3/uL (ref 0.0–0.5)
Eosinophil %: 1 %
Hematocrit: 36 % — ABNORMAL LOW (ref 40–51)
Hemoglobin: 11.9 g/dL — ABNORMAL LOW (ref 13.7–17.5)
IMM Granulocytes #: 0.1 10*3/uL — ABNORMAL HIGH (ref 0.0–0.0)
IMM Granulocytes: 0.5 %
Lymph # K/uL: 3.1 10*3/uL (ref 1.3–3.6)
Lymphocyte %: 19.7 %
MCH: 26 pg (ref 26–32)
MCHC: 33 g/dL (ref 32–37)
MCV: 78 fL — ABNORMAL LOW (ref 79–92)
Mono # K/uL: 1 10*3/uL — ABNORMAL HIGH (ref 0.3–0.8)
Monocyte %: 6.7 %
Neut # K/uL: 11.1 10*3/uL — ABNORMAL HIGH (ref 1.8–5.4)
Nucl RBC # K/uL: 0 10*3/uL (ref 0.0–0.0)
Nucl RBC %: 0 /100 WBC (ref 0.0–0.2)
Platelets: 257 10*3/uL (ref 150–330)
RBC: 4.6 MIL/uL (ref 4.6–6.1)
RDW: 14.4 % (ref 11.6–14.4)
Seg Neut %: 71.6 %
WBC: 15.5 10*3/uL — ABNORMAL HIGH (ref 4.2–9.1)

## 2022-03-12 LAB — LACTATE, VENOUS, WHOLE BLOOD
Lactate VEN,WB: 2.3 mmol/L — ABNORMAL HIGH (ref 0.5–2.2)
Lactate VEN,WB: 1.5 mmol/L (ref 0.5–2.2)

## 2022-03-12 LAB — POCT GLUCOSE: Glucose POCT: 98 mg/dL (ref 60–99)

## 2022-03-12 LAB — BASIC METABOLIC PANEL
Anion Gap: 13 (ref 7–16)
CO2: 22 mmol/L (ref 20–28)
Calcium: 9 mg/dL (ref 9.0–10.3)
Chloride: 104 mmol/L (ref 96–108)
Creatinine: 1.11 mg/dL (ref 0.67–1.17)
Glucose: 116 mg/dL — ABNORMAL HIGH (ref 60–99)
Lab: 13 mg/dL (ref 6–20)
Potassium: 4.1 mmol/L (ref 3.3–5.1)
Sodium: 139 mmol/L (ref 133–145)
eGFR BY CREAT: 88 *

## 2022-03-12 LAB — ETHANOL: Ethanol: <10 mg/dL (ref 0–9)

## 2022-03-12 LAB — HOLD SST

## 2022-03-12 LAB — CONFIRMATORY TYPE: Confirmatory ABORH: B POS

## 2022-03-12 LAB — PTH, INTACT: Intact PTH: 51.4 pg/mL (ref 15.0–65.0)

## 2022-03-12 LAB — HOLD GRAY

## 2022-03-12 LAB — VITAMIN D: 25-OH Vit Total: 8 ng/mL — ABNORMAL LOW (ref 30–60)

## 2022-03-12 LAB — HOLD LAVENDER

## 2022-03-12 SURGERY — INSERTION, INTRAMEDULLARY ROD, TIBIA
Anesthesia: General | Site: Leg | Laterality: Right | Wound class: Dirty or Infected

## 2022-03-12 MED ORDER — ONDANSETRON HCL 2 MG/ML IV SOLN *I*
4.0000 mg | Freq: Four times a day (QID) | INTRAMUSCULAR | Status: DC | PRN
Start: 2022-03-12 — End: 2022-03-15

## 2022-03-12 MED ORDER — DEXAMETHASONE SODIUM PHOSPHATE 4 MG/ML INJ SOLN *WRAPPED*
INTRAMUSCULAR | Status: DC | PRN
Start: 2022-03-12 — End: 2022-03-12
  Administered 2022-03-12: 8 mg via INTRAVENOUS

## 2022-03-12 MED ORDER — OXYCODONE HCL 5 MG PO TABS *I*
5.0000 mg | ORAL_TABLET | ORAL | Status: DC | PRN
Start: 2022-03-12 — End: 2022-03-14

## 2022-03-12 MED ORDER — HYDROMORPHONE HCL PF 1 MG/ML IJ SOLN *WRAPPED*
INTRAMUSCULAR | Status: DC | PRN
Start: 2022-03-12 — End: 2022-03-12
  Administered 2022-03-12: 1 mg via INTRAVENOUS
  Administered 2022-03-12 (×2): .5 mg via INTRAVENOUS

## 2022-03-12 MED ORDER — SUGAMMADEX SODIUM 100 MG/1ML IV SOLN *WRAPPED*
INTRAVENOUS | Status: AC
Start: 2022-03-12 — End: 2022-03-12
  Filled 2022-03-12: qty 1

## 2022-03-12 MED ORDER — FENTANYL CITRATE 50 MCG/ML IJ SOLN *WRAPPED*
INTRAMUSCULAR | Status: AC
Start: 2022-03-12 — End: 2022-03-12
  Filled 2022-03-12: qty 2

## 2022-03-12 MED ORDER — ALBUTEROL SULFATE HFA 108 (90 BASE) MCG/ACT IN AERS *I*
2.0000 | INHALATION_SPRAY | Freq: Four times a day (QID) | RESPIRATORY_TRACT | Status: DC | PRN
Start: 2022-03-12 — End: 2022-03-15
  Filled 2022-03-12: qty 6.7

## 2022-03-12 MED ORDER — OXYCODONE HCL 5 MG/5ML PO SOLN *I*
ORAL | Status: AC
Start: 2022-03-12 — End: 2022-03-12
  Filled 2022-03-12: qty 10

## 2022-03-12 MED ORDER — HYDROMORPHONE HCL PF 1 MG/ML IJ SOLN *WRAPPED*
INTRAMUSCULAR | Status: AC
Start: 2022-03-12 — End: 2022-03-12
  Filled 2022-03-12: qty 1

## 2022-03-12 MED ORDER — ONDANSETRON HCL 2 MG/ML IV SOLN *I*
INTRAMUSCULAR | Status: DC | PRN
Start: 2022-03-12 — End: 2022-03-12
  Administered 2022-03-12: 4 mg via INTRAVENOUS

## 2022-03-12 MED ORDER — DOCUSATE SODIUM 100 MG PO CAPS *I*
100.0000 mg | ORAL_CAPSULE | Freq: Two times a day (BID) | ORAL | Status: DC
Start: 2022-03-12 — End: 2022-03-15
  Administered 2022-03-13 (×2): 100 mg via ORAL
  Filled 2022-03-12 (×5): qty 1

## 2022-03-12 MED ORDER — HALOPERIDOL LACTATE 5 MG/ML IJ SOLN *I*
0.5000 mg | Freq: Once | INTRAMUSCULAR | Status: DC | PRN
Start: 2022-03-12 — End: 2022-03-12

## 2022-03-12 MED ORDER — KETAMINE HCL 10 MG/ML IJ/IV SOLN *WRAPPED*
Status: DC | PRN
Start: 2022-03-12 — End: 2022-03-12
  Administered 2022-03-12: 40 mg via INTRAVENOUS

## 2022-03-12 MED ORDER — SUGAMMADEX SODIUM 100 MG/1ML IV SOLN *WRAPPED*
INTRAVENOUS | Status: DC | PRN
Start: 2022-03-12 — End: 2022-03-12
  Administered 2022-03-12: 200 mg via INTRAVENOUS

## 2022-03-12 MED ORDER — DEXTROSE 5 % FLUSH FOR PUMPS *I*
0.0000 mL/h | INTRAVENOUS | Status: DC | PRN
Start: 2022-03-12 — End: 2022-03-15

## 2022-03-12 MED ORDER — SENNOSIDES 8.6 MG PO TABS *I*
2.0000 | ORAL_TABLET | Freq: Every day | ORAL | Status: DC
Start: 2022-03-12 — End: 2022-03-15
  Administered 2022-03-13: 2 via ORAL
  Filled 2022-03-12 (×3): qty 2

## 2022-03-12 MED ORDER — HYDROMORPHONE HCL PF 1 MG/ML IJ SOLN *WRAPPED*
0.5000 mg | INTRAMUSCULAR | Status: DC | PRN
Start: 2022-03-12 — End: 2022-03-13
  Administered 2022-03-12: 0.5 mg via INTRAVENOUS
  Filled 2022-03-12: qty 0.5

## 2022-03-12 MED ORDER — ESMOLOL HCL 10 MG/ML IV SOLN *I*
INTRAVENOUS | Status: DC | PRN
Start: 2022-03-12 — End: 2022-03-12
  Administered 2022-03-12: 20 mg via INTRAVENOUS

## 2022-03-12 MED ORDER — AZELASTINE HCL 0.05 % OP SOLN *I*
1.0000 [drp] | Freq: Two times a day (BID) | OPHTHALMIC | Status: DC
Start: 2022-03-12 — End: 2022-03-15
  Filled 2022-03-12: qty 6

## 2022-03-12 MED ORDER — SODIUM CHLORIDE 0.9 % FLUSH FOR PUMPS *I*
0.0000 mL/h | INTRAVENOUS | Status: DC | PRN
Start: 2022-03-12 — End: 2022-03-15

## 2022-03-12 MED ORDER — ROCURONIUM BROMIDE 10 MG/ML IV SOLN *WRAPPED*
Status: DC | PRN
Start: 2022-03-12 — End: 2022-03-12
  Administered 2022-03-12: 50 mg via INTRAVENOUS
  Administered 2022-03-12: 10 mg via INTRAVENOUS

## 2022-03-12 MED ORDER — ENOXAPARIN SODIUM 40 MG/0.4ML IJ SOSY *I*
40.0000 mg | PREFILLED_SYRINGE | Freq: Every day | INTRAMUSCULAR | Status: DC
Start: 2022-03-13 — End: 2022-03-15
  Administered 2022-03-13 – 2022-03-14 (×2): 40 mg via SUBCUTANEOUS
  Filled 2022-03-12 (×2): qty 0.4

## 2022-03-12 MED ORDER — SODIUM CHLORIDE 0.9 % IR SOLN *I*
Status: AC | PRN
Start: 2022-03-12 — End: 2022-03-12
  Administered 2022-03-12: 1000 mL
  Administered 2022-03-12: 9000 mL

## 2022-03-12 MED ORDER — MIDAZOLAM HCL 1 MG/ML IJ SOLN *I* WRAPPED
INTRAMUSCULAR | Status: AC
Start: 2022-03-12 — End: 2022-03-12
  Filled 2022-03-12: qty 2

## 2022-03-12 MED ORDER — OMEPRAZOLE 40 MG PO CPDR *I*
40.0000 mg | DELAYED_RELEASE_CAPSULE | Freq: Every day | ORAL | Status: DC
Start: 2022-03-12 — End: 2022-03-15
  Administered 2022-03-13 – 2022-03-14 (×2): 40 mg via ORAL
  Filled 2022-03-12 (×4): qty 1

## 2022-03-12 MED ORDER — MIDAZOLAM HCL 1 MG/ML IJ SOLN *I* WRAPPED
INTRAMUSCULAR | Status: DC | PRN
Start: 2022-03-12 — End: 2022-03-12
  Administered 2022-03-12: 2 mg via INTRAVENOUS

## 2022-03-12 MED ORDER — LACTATED RINGERS IV SOLN *I*
INTRAVENOUS | Status: DC | PRN
Start: 2022-03-12 — End: 2022-03-12

## 2022-03-12 MED ORDER — LIDOCAINE HCL 2 % IJ SOLN *I*
INTRAMUSCULAR | Status: DC | PRN
Start: 2022-03-12 — End: 2022-03-12
  Administered 2022-03-12: 100 mg via INTRAVENOUS

## 2022-03-12 MED ORDER — DIPHENHYDRAMINE HCL 25 MG ORAL SOLID *WRAPPED*
25.0000 mg | Freq: Four times a day (QID) | ORAL | Status: DC | PRN
Start: 2022-03-12 — End: 2022-03-15

## 2022-03-12 MED ORDER — CEFAZOLIN 2000 MG IN STERILE WATER 20ML SYRINGE *I*
2000.0000 mg | PREFILLED_SYRINGE | Freq: Three times a day (TID) | INTRAVENOUS | Status: AC
Start: 2022-03-12 — End: 2022-03-13
  Administered 2022-03-12 – 2022-03-13 (×2): 2000 mg via INTRAVENOUS
  Filled 2022-03-12 (×2): qty 2000

## 2022-03-12 MED ORDER — OXYCODONE HCL 5 MG/5ML PO SOLN *I*
10.0000 mg | Freq: Once | ORAL | Status: AC | PRN
Start: 2022-03-12 — End: 2022-03-12
  Administered 2022-03-12: 10 mg via ORAL

## 2022-03-12 MED ORDER — HYDROMORPHONE HCL PF 1 MG/ML IJ SOLN *WRAPPED*
0.5000 mg | INTRAMUSCULAR | Status: DC | PRN
Start: 2022-03-12 — End: 2022-03-12
  Administered 2022-03-12: 0.5 mg via INTRAVENOUS

## 2022-03-12 MED ORDER — HYDROXYZINE HCL 25 MG PO TABS *I*
25.0000 mg | ORAL_TABLET | Freq: Three times a day (TID) | ORAL | Status: DC | PRN
Start: 2022-03-12 — End: 2022-03-15
  Administered 2022-03-12 – 2022-03-13 (×2): 25 mg via ORAL
  Filled 2022-03-12 (×3): qty 1

## 2022-03-12 MED ORDER — OXYCODONE HCL 10 MG PO TABS *I*
10.0000 mg | ORAL_TABLET | ORAL | Status: DC | PRN
Start: 2022-03-12 — End: 2022-03-14
  Administered 2022-03-12 – 2022-03-14 (×8): 10 mg via ORAL
  Filled 2022-03-12 (×8): qty 1

## 2022-03-12 MED ORDER — ALBUTEROL SULFATE (2.5 MG/3ML) 0.083% IN NEBU *I*
2.5000 mg | INHALATION_SOLUTION | Freq: Once | RESPIRATORY_TRACT | Status: DC | PRN
Start: 2022-03-12 — End: 2022-03-12

## 2022-03-12 MED ORDER — LACTATED RINGERS IV SOLN *I*
100.0000 mL/h | INTRAVENOUS | Status: DC
Start: 2022-03-12 — End: 2022-03-12
  Administered 2022-03-12: 100 mL/h via INTRAVENOUS

## 2022-03-12 MED ORDER — HYDROMORPHONE HCL PF 1 MG/ML IJ SOLN *WRAPPED*
INTRAMUSCULAR | Status: AC
Start: 2022-03-12 — End: 2022-03-12
  Filled 2022-03-12: qty 0.5

## 2022-03-12 MED ORDER — POLYETHYLENE GLYCOL 3350 PO PACK 17 GM *I*
17.0000 g | PACK | Freq: Every day | ORAL | Status: DC
Start: 2022-03-12 — End: 2022-03-15
  Administered 2022-03-13: 17 g via ORAL
  Filled 2022-03-12 (×3): qty 17

## 2022-03-12 MED ORDER — LACTATED RINGERS IV BOLUS *I*
1000.0000 mL | Freq: Once | INTRAVENOUS | Status: AC
Start: 2022-03-12 — End: 2022-03-12
  Administered 2022-03-12: 1000 mL via INTRAVENOUS

## 2022-03-12 MED ORDER — LABETALOL HCL 20 MG/4 ML IV SOLN WRAPPED *I*
INTRAVENOUS | Status: DC | PRN
Start: 2022-03-12 — End: 2022-03-12
  Administered 2022-03-12: 5 mg via INTRAVENOUS

## 2022-03-12 MED ORDER — PROPOFOL 10 MG/ML IV EMUL (INTERMITTENT DOSING) WRAPPED *I*
INTRAVENOUS | Status: DC | PRN
Start: 2022-03-12 — End: 2022-03-12
  Administered 2022-03-12: 220 mg via INTRAVENOUS
  Administered 2022-03-12 (×2): 50 mg via INTRAVENOUS

## 2022-03-12 MED ORDER — ACETAMINOPHEN 500 MG PO TABS *I*
1000.0000 mg | ORAL_TABLET | Freq: Three times a day (TID) | ORAL | Status: DC
Start: 2022-03-12 — End: 2022-03-15
  Administered 2022-03-12 – 2022-03-15 (×8): 1000 mg via ORAL
  Filled 2022-03-12 (×8): qty 2

## 2022-03-12 MED ORDER — OXYCODONE HCL 5 MG/5ML PO SOLN *I*
5.0000 mg | Freq: Once | ORAL | Status: AC | PRN
Start: 2022-03-12 — End: 2022-03-12

## 2022-03-12 MED ORDER — KETAMINE HCL 10 MG/ML IJ/IV SOLN *WRAPPED*
Status: AC
Start: 2022-03-12 — End: 2022-03-12
  Filled 2022-03-12: qty 5

## 2022-03-12 MED ORDER — CEFAZOLIN 1000 MG IN STERILE WATER 10ML SYRINGE *I*
PREFILLED_SYRINGE | INTRAVENOUS | Status: DC | PRN
  Administered 2022-03-12: 2000 mg via INTRAVENOUS

## 2022-03-12 MED ORDER — HYDROMORPHONE HCL PF 1 MG/ML IJ SOLN *WRAPPED*
0.2500 mg | INTRAMUSCULAR | Status: DC | PRN
Start: 2022-03-12 — End: 2022-03-12

## 2022-03-12 MED ORDER — FLUTICASONE PROPIONATE 50 MCG/ACT NA SUSP *I*
1.0000 | Freq: Every day | NASAL | Status: DC
Start: 2022-03-12 — End: 2022-03-15
  Filled 2022-03-12: qty 16

## 2022-03-12 MED ORDER — FENTANYL CITRATE 50 MCG/ML IJ SOLN *WRAPPED*
INTRAMUSCULAR | Status: DC | PRN
Start: 2022-03-12 — End: 2022-03-12
  Administered 2022-03-12 (×2): 50 ug via INTRAVENOUS

## 2022-03-12 MED ORDER — SODIUM CHLORIDE 0.9 % 25 ML IV SOLN *I*
5.0000 mg | Freq: Once | INTRAVENOUS | Status: DC | PRN
Start: 2022-03-12 — End: 2022-03-12

## 2022-03-12 SURGICAL SUPPLY — 39 items
APPLICATOR CHLORAPREP 26ML ORANGE LARGE (Solution) ×6 IMPLANT
BANDAGE ESMARK 6IN X 3YD LF STER (Dressing) ×2 IMPLANT
BANDAGE FLEX MASTER 6IN X 5YD (Dressing) ×2 IMPLANT
BLADE SURG CARBON STEEL #15 STER (Supply) ×4 IMPLANT
BLADE SURG CARBON STEEL #15 STER REUSE HNDL (Supply) ×2 IMPLANT
COVER HANDLE FOR STERIS LIGHTING STERILE (Supply) ×6 IMPLANT
CUFF TOURNIQUET SPSB PLC 34IN STER DISP (Supply) ×4 IMPLANT
DRAPE C ARM OEC 12IN (Drape) ×2 IMPLANT
DRAPE C ARM TRNSPAR POLY PROTCT BARR FOR ~~LOC~~ FLROSCP C-ARMOR (Drape) ×4 IMPLANT
DRAPE ISOLATION BAG 18X18.5IN LF (Drape) ×2 IMPLANT
DRAPE SHEET 40X70 MED (Drape) ×2 IMPLANT
DRAPE SUR 121X90X128IN 5 LAYR EXT SMS POLYPR T ABSRB REINF CIR E FEN HK LOOP LN HLDR DISP (Drape) ×2 IMPLANT
DRAPE SURG IOBAN 2 ANTIMIC LG 23 X 17IN (Drape) ×2 IMPLANT
DRAPE SURG IOBAN 2 ANTIMIC XL 23 X 33IN (Drape) ×2 IMPLANT
DRAPE U-SHEET SPLIT PLASTIC STERIL (Drape) ×2 IMPLANT
DRESSING FLUFF SUPER SP 6 X 6.75IN STER (Dressing) ×8 IMPLANT
DRESSING XEROFORM 5 IN X9 IN (Dressing) ×4 IMPLANT
FILTER NEPTUNE 4PORT MANIFOLD (Supply) ×2 IMPLANT
GLOVE LINER BIOGEL INDICATOR SZ8 LTX (Glove) ×20 IMPLANT
GLOVE SURG BIOGEL SZ8 LTX STER PF (Glove) ×20 IMPLANT
GUIDEWIRE BALL TIP 3 X 100MM (Guidewire) ×2 IMPLANT
LOCK SCREW FOR MEDULLA NAIL 5 MM L38 XL25 (Screw) ×2 IMPLANT
LOCK SCREW FOR MEDULLA NAIL 5 MM L40 XL25 (Screw) ×2 IMPLANT
LOCK SCREW FOR MEDULLA NAIL 5 MM L42 XL25 (Screw) ×2 IMPLANT
LOCK SCREW FOR MEDULLA NAIL 5 MM L48 XL25 (Screw) ×2 IMPLANT
PACK CUSTOM DOUBLE BASIN (Pack) ×2 IMPLANT
PACK CUSTOM ORTHO EXTREMITY CDS (Pack) ×2 IMPLANT
PACK TOWEL LIGHT BLUE STERILE (Pack) ×8 IMPLANT
PACK TOWEL ORTHO LIGHT BLUE STER (Pack) ×2 IMPLANT
ROD REAM BALL TIP 2.5 (Supply) ×2 IMPLANT
SHAFT REAMER MODIFIED TRINKLE 8X510MM (Supply) ×2 IMPLANT
SLEEVE COMP KNEE HI MED (Supply) ×2 IMPLANT
SOL SODIUM CHLORIDE IRRIG 1000ML BTL (Solution) ×2 IMPLANT
STAPLER SKIN WIDE STPL LEG L3.9MM WIRE DIA0.58MM FIX HD PROX (Supply) ×4 IMPLANT
SUTR ETHILON MONO 2-0 30IN BLACK (Suture) ×4 IMPLANT
SUTR ETHILON MONO 3-0 PS-1 BLACK (Suture) IMPLANT
SUTR VICRYL ANTIB 1 CT-1 POP 18 VIOLET (Suture) ×2 IMPLANT
SUTR VICRYL ANTIB BRD 2-0 CP-2 18 UNDYED (Suture) ×2 IMPLANT
TIB NAIL 10 MM LENGTH 360 MM STER (Nail) ×2 IMPLANT

## 2022-03-12 NOTE — Progress Notes (Signed)
PHYSICAL THERAPY: Since time that PT order written, pt now going to OR for surgery to LE. Please reorder PT post op when pt medically appropriate to participate with appropriate wt bearing orders indicated.  Thank you.  Britta Mccreedy Taleah Bellantoni,PT/[id number]
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
## 2022-03-12 NOTE — Anesthesia Case Conclusion (Signed)
CASE CONCLUSION  Emergence  Actions:  Suctioned, soft bite block and extubated  Criteria Used for Airway Removal:  Adequate Tv & RR and acceptable O2 saturation  Assessment:  Routine  Transport  Directly to: PACU  Airway:  Nasal cannula  Oxygen Delivery:  4 lpm  Position:  Recumbent  Patient Condition on Handoff  Level of Consciousness:  Mildly sedated  Patient Condition:  Stable  Handoff Report to:  RN
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
## 2022-03-12 NOTE — INTERIM OP NOTE (Signed)
Procedure: R tibia I&D, IMN  Planned RTOR: none  Antibiotic Regimen: periop ancef   Pain Management: MMA   DVT prophylaxis: lovenox in house asa 325 daily on d/c   Weight Bearing Status and Activity Restrictions: NWB RLE   Pin/Wound Care: splint  Xrays/Imaging: R tibia XRs  Additional Specifics: none    Interim Op Note (Surgical Log ID: [id number])       Date of Surgery: 03/12/2022       Surgeons: Surgeon(s) and Role:     * Jerolyn Center, MD - Primary     * Philopater Mucha, Fulton Reek, MD - Resident - Assisting     * Luisa Dago, MD - Resident - Assisting   Assistants:         Pre-op Diagnosis: Pre-Op Diagnosis Codes:     * Tibia/fibula fracture [A54.098J, S82.409A]       Post-op Diagnosis: Post-Op Diagnosis Codes:     * Tibia/fibula fracture [S82.209A, S82.409A]       Procedure(s) Performed: Procedure(s) (LRB):  RIGHT TIBIA IMN I/D (Right)       Anesthesia Type: General        Fluid Totals: I/O this shift:  05/01 1500 - 05/01 2259  In: 900 (9.4 mL/kg) [I.V.:900]  Out: 50 (0.5 mL/kg) [Blood:50]  Net: 850  Weight: 95.7 kg        Estimated Blood Loss: 50 mL       Specimens to Pathology:  * No specimens in log *       Temporary Implants:        Packing:                 Patient Condition: good       Findings (Including unexpected complications): See op note     Signed:  Gerald Leitz, MD  on 03/12/2022 at 5:44 PM

## 2022-03-12 NOTE — Progress Notes (Signed)
Brief Orthopaedic Surgery Progress Note      Compartment check performed at 2245. Patient resting comfortably. Pain controlled on current regimen. Denies new numbness or tingling in the RLE.      Exam RLE:      - Splint C/D/I  - Compartments windowed through splint are soft and compressible  - No pain with passive flexion/extension of toes  - SILT over exposed toes and 1st DWS   - Active toe flexion/ extension  - capillary refill < 3 seconds        A/P:       Low suspicion for compartment syndrome at this time. Will continue to monitor.  - Please page Orthopaedic resident on-call if there is any increase in concern for compartment syndrome, specifically tense compartments, excruciating pain with passive range of motion, and increasing requirement for IV narcotics     Roger Shelter, MD  Orthopaedic Surgery Resident  10:53 PM, 03/12/2022
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
## 2022-03-12 NOTE — Progress Notes (Addendum)
Brief Orthopaedic Surgery Progress Note      Compartment check performed at 1015. Patient resting comfortably. Current pain regimen controlling sx. Denies new numbness or tingling in the RLE.      Exam RLE:      - Splint C/D/I  - Compartments windowed through splint are soft and compressible  - No pain with passive flexion/extension of toes  - SILT over exposed toes and 1st DWS   - Active toe flexion/ extension  - capillary refill < 3 seconds        A/P:       Low suspicion for compartment syndrome at this time. Will continue to monitor.  - Please page Orthopaedic resident on-call if there is any increase in concern for compartment syndrome, specifically tense compartments, excruciating pain with passive range of motion, and increasing requirement for IV narcotics     Clearance Coots, MD  Orthopaedic Surgery Resident  12:23 PM, 03/12/2022
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
## 2022-03-12 NOTE — Plan of Care (Signed)
Problem: Impaired ADL  Goal: Increase ADL Independence  Note: LOWER BODY DRESSING - Patient will complete lower body dressing with reacher and leg lifter and supervision at edge of bed - Time Frame: 3-5 Visits  TOILETING - Patient will complete toileting with no device and contact guard assist on commode - Time Frame: 3-5 Visits  ADL MOBILITY - Patient will ambulate to commode with contact guard assist, Adaptive Device: rolling walker - Time Frame: 3-5 Visits

## 2022-03-12 NOTE — Progress Notes (Signed)
Brief Orthopaedic Surgery Progress Note     Written in delay. Compartment check performed at 0000. Patient resting comfortably. Pain well controlled. Denies new numbness or tingling in the RLE.      Exam RLE:      - Splint C/D/I  - Compartments windowed through splint are soft and compressible  - No pain with passive flexion/extension of toes  - SILT over exposed toes and 1st DWS   - Active toe flexion/ extension  - capillary refill < 3 seconds        A/P:       Low suspicion for compartment syndrome at this time. Will continue to monitor.  - Please page Orthopaedic resident on-call if there is any increase in concern for compartment syndrome, specifically tense compartments, excruciating pain with passive range of motion, and increasing requirement for IV narcotics     Roger Shelter, MD  Orthopaedic Surgery Resident  12:51 AM, 03/12/2022
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
## 2022-03-12 NOTE — ED Notes (Signed)
ED NURSE RESIDENT ATTESTATION       I [name] have reviewed the following charting information by the Nurse Resident: [name].    Nursing Assessments  Medications  Plan of Care  Teaching   Notes    In the chart of [name] (36 y.o. male) and attest to the charting being accurate.

## 2022-03-12 NOTE — Progress Notes (Signed)
Brief Orthopaedic Surgery Progress Note      Compartment check performed at 0810. Patient resting comfortably. Current pain regimen controlling sx. Denies new numbness or tingling in the RLE.      Exam RLE:      - Splint C/D/I  - Compartments windowed through splint are soft and compressible  - No pain with passive flexion/extension of toes  - SILT over exposed toes and 1st DWS   - Active toe flexion/ extension  - capillary refill < 3 seconds        A/P:       Low suspicion for compartment syndrome at this time. Will continue to monitor.  - Please page Orthopaedic resident on-call if there is any increase in concern for compartment syndrome, specifically tense compartments, excruciating pain with passive range of motion, and increasing requirement for IV narcotics     Clearance Coots, MD  Orthopaedic Surgery Resident  8:14 AM, 03/12/2022
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
## 2022-03-12 NOTE — Progress Notes (Addendum)
Brief Orthopaedic Surgery Progress Note      Compartment check performed at 1225. Patient resting comfortably. Current pain regimen controlling sx. Denies new numbness or tingling in the RLE.      Exam RLE:      - Splint C/D/I  - Compartments windowed through splint are soft and compressible  - No pain with passive flexion/extension of toes  - SILT over exposed toes and 1st DWS   - Active toe flexion/ extension  - capillary refill < 3 seconds        A/P:       - Low suspicion for compartment syndrome at this time. OR shortly.  Will continue to monitor.  - Please page Orthopaedic resident on-call if there is any increase in concern for compartment syndrome, specifically tense compartments, excruciating pain with passive range of motion, and increasing requirement for IV narcotics     Clearance Coots, MD  Orthopaedic Surgery Resident  12:25 PM, 04-07-22     UPDATES TO PATIENT'S CONDITION on the DAY OF SURGERY/PROCEDURE    I. Updates to Patient's Condition (to be completed by a provider privileged to complete a H&P, following reassessment of the patient by the provider):    Day of Surgery/Procedure Update:  History  History reviewed and no change    Physical  Physical exam updated and no change            II. Procedure Readiness   I have reviewed the patient's H&P and updated condition. By completing and signing this form, I attest that this patient is ready for surgery/procedure.    III. Attestation   I have reviewed the updated information regarding the patient's condition and it is appropriate to proceed with the planned surgery/procedure.    I have reminded Mr. Blazejewski that COVID-19 is still present in our community. He was advised that [facility] and its affiliates have made deliberate and widespread changes to policies and procedures, consistent with applicable directives, in order to reduce the risk of exposure in our facilities.     I further explained and Mr. Bucko understands that given the  communicability of the SARS CoV2 coronavirus, there remains a small but real risk of contracting the disease while receiving perioperative care - even with stringent preventive measures in place.   Mr. Spear understands the potential consequences of COVID-19 disease as it relates to their planned procedure and anticipated postoperative course.      The patient and I have considered and discussed the relative risks and benefits of proceeding with his/her surgery - both in terms of the procedure itself, and also in the context of the ongoing pandemic.  Mr. Iaquinto wishes to proceed with the procedure.     Jerolyn Center, MD as of 2:39 PM 04-07-22     Orthopaedic Attending    I saw and evaluated the patient.  The resident note was reviewed and confirmed.  I discussed the surgery, perioperative recovery period, as well as the risks and benefits with the patient.  All questions were answered.  The patient consented to surgical intervention.  They are aware of the risks of surgery including, but not limited to bleeding, infection, damage to soft tissues including the nerves and vessels, nonunion, malunion, failure of fixation, hardware problems, superficial or deep wounds, possible amputation and the risks of anaesthesia including stroke heart attack and death.    Jerolyn Center, M.D.  04-07-2022, 2:39 PM

## 2022-03-12 NOTE — Progress Notes (Signed)
Brief Orthopaedic Surgery Progress Note      Compartment check performed at 0620. Patient resting comfortably. Having some breakthrough pain, although due for oxycodone at this time. Denies new numbness or tingling in the RLE.      Exam RLE:      - Splint C/D/I  - Compartments windowed through splint are soft and compressible  - No pain with passive flexion/extension of toes  - SILT over exposed toes and 1st DWS   - Active toe flexion/ extension  - capillary refill < 3 seconds        A/P:       Low suspicion for compartment syndrome at this time. Will continue to monitor.  - Please page Orthopaedic resident on-call if there is any increase in concern for compartment syndrome, specifically tense compartments, excruciating pain with passive range of motion, and increasing requirement for IV narcotics     Roger Shelter, MD  Orthopaedic Surgery Resident  6:22 AM, 03/12/2022

## 2022-03-12 NOTE — Discharge Instructions (Addendum)
Call:  Call your Surgeon's office promptly if you experience any of these symptoms:  fever > 101.5 F, redness about incision or significant or foul drainage, cold blue toes, numbness or tingling or weakness, chills, pain not relieved by medications.    If you cannot reach your Surgeon, then call the Answering Service (on-call phone number:  [phone]).  If you experience a medical emergency (such as chest pain or shortness of breath) then proceed directly to the Emergency Department.      Activity:  Non weight bearing right lower extremity.  Keep limb elevated on 2-3 pillows so that it is above the level of your heart for at least 72 hours.  Wiggle toes of affected limb.  When you are not walking, keep leg elevated as much as possible.  Apply ice to affected extremity as needed.  Do not drive until instructed that you may do so by your physician.    Incision/Wound/Splint:  Keep splint/cast/brace clean and dry.  Do not remove the splint/cast/brace.  Do not scratch underneath or place anything down the splint/cast/brace.  Call the office if the splint/cast/brace is saturated, wet, falling off or for any other problems.  No tub baths, no pools, no hot tubs.    If you have a home care agency and have non-life threatening questions (for example, regarding dressings, wound care, specific activities or needs of daily living), you may contact your home agency as a first step.      Dr. Drinda Butts :  appointments [phone], follow up on 5/10 as scheduled.     Disposing of Medications and Sharps:  Medications:    Call [phone] for a list of pharmaceutical disposal locations  OR  Visit [email or web address]  Sharps:  All hospitals and nursing homes in [place] are mandated by law to accept home-generated sharps as a free community service.    Our outpatient offices are not equipped to accept these items.

## 2022-03-12 NOTE — Anesthesia Procedure Notes (Signed)
---------------------------------------------------------------------------------------------------------------------------------------    AIRWAY   GENERAL INFORMATION AND STAFF    Patient location during procedure: OR       Date of Procedure: 03/12/2022 3:05 PM  CONDITION PRIOR TO MANIPULATION     Current Airway/Neck Condition:  Normal        For more airway physical exam details, see Anesthesia PreOp Evaluation  AIRWAY METHOD     Patient Position:  Sniffing    Preoxygenated: yes      Maintained In-Line Stability: not needed, normal c-spine condition          To see details of medications used, see MAR    Induction: IV      Technique Used for Successful ETT Placement:  Video laryngoscopy    Devices/Methods Used in Placement:  Intubating stylet    Blade Type:  Hyperangulated    Laryngoscope Blade/Video laryngoscope Blade Size:  3    Cormack-Lehane Classification:  Grade I - full view of glottis    Placement Verified by: capnometry, auscultation and equal breath sounds      Number of Attempts at Approach:  2    Ventilation Between Attempts:  Bag mask and 2 hand bag mask    Number of Other Approaches Attempted:  1    Unsuccessful Approach(es) for ETT:  Direct laryngoscopy  FINAL AIRWAY DETAILS    Final Airway Type:  Endotracheal airway    Adjunct Airway: soft bite block    Final Endotracheal Airway:  ETT      Cuffed: cuffed    Insertion Site:  Oral    ETT Size (mm):  8.0  ADDITIONAL COMMENTS   DL x2 with mac 3 and miller 3, unsuccessful view of vocal cords. Glidescope called to bedside for successful intubation. Oral mucosa and dentition remain intact and as was in prean.    ----------------------------------------------------------------------------------------------------------------------------------------

## 2022-03-13 LAB — BASIC METABOLIC PANEL
Anion Gap: 17 — ABNORMAL HIGH (ref 7–16)
Anion Gap: 12 (ref 7–16)
CO2: 21 mmol/L (ref 20–28)
CO2: 26 mmol/L (ref 20–28)
Calcium: 9.3 mg/dL (ref 9.0–10.3)
Calcium: 8.8 mg/dL — ABNORMAL LOW (ref 9.0–10.3)
Chloride: 101 mmol/L (ref 96–108)
Chloride: 105 mmol/L (ref 96–108)
Creatinine: 1.03 mg/dL (ref 0.67–1.17)
Creatinine: 1.04 mg/dL (ref 0.67–1.17)
Glucose: 173 mg/dL — ABNORMAL HIGH (ref 60–99)
Glucose: 115 mg/dL — ABNORMAL HIGH (ref 60–99)
Lab: 7 mg/dL (ref 6–20)
Lab: 8 mg/dL (ref 6–20)
Potassium: 4.4 mmol/L (ref 3.3–5.1)
Potassium: 4.1 mmol/L (ref 3.3–5.1)
Sodium: 139 mmol/L (ref 133–145)
Sodium: 143 mmol/L (ref 133–145)
eGFR BY CREAT: 96 *
eGFR BY CREAT: 95 *

## 2022-03-13 LAB — MCHC
MCHC: 33 g/dL (ref 32–37)
MCHC: 34 g/dL (ref 32–37)

## 2022-03-13 LAB — HEMATOCRIT
Hematocrit: 37 % — ABNORMAL LOW (ref 40–51)
Hematocrit: 33 % — ABNORMAL LOW (ref 40–51)

## 2022-03-13 MED ORDER — HYDROMORPHONE HCL PF 1 MG/ML IJ SOLN *WRAPPED*
0.5000 mg | INTRAMUSCULAR | Status: DC | PRN
Start: 2022-03-13 — End: 2022-03-15
  Administered 2022-03-13 – 2022-03-15 (×11): 0.5 mg via INTRAVENOUS
  Filled 2022-03-13 (×11): qty 0.5

## 2022-03-13 NOTE — Plan of Care (Signed)
Problem: Impaired Bed Mobility  Goal: STG - IMPROVE BED MOBILITY  Outcome: Progressing towards goal  Note: Patient will perform bed mobility without rails and the head of bed flat with No assist (Independently) of 1    Time frame: 1-3 Visits     Problem: Impaired Transfers  Goal: STG - IMPROVE TRANSFERS  Outcome: Progressing towards goal  Note: Patient will complete Sit to stand transfers using least restrictive assistive device with Modified independence of 1    Time frame: 3-5 Visits     Problem: Impaired Ambulation  Goal: LTG - IMPROVE AMBULATION  Outcome: Progressing towards goal  Note: Patient will ambulate 100-149 feet using least restrictive assistive device with Modified independenceof 1    Time frame: 5-7 Visits       Problem: Impaired Stair Navigation  Goal: LTG - IMPROVE STAIR NAVIGATION  Outcome: Progressing towards goal  Note: Patient will navigate less than 4 steps with 1  rail(s) and least restrictive assistive device and Modified independence of 1    Time frame: 5-7 Visits     Fenton Malling PT, DPT  Pager # [phone]
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
## 2022-03-13 NOTE — Progress Notes (Signed)
03/13/22 0847   [facility] Patient Class Review   Patient Class Review Inpatient     Patient class effective as of 03/11/22     Greig Right , RN  Utilization Management   [phone]

## 2022-03-13 NOTE — Plan of Care (Signed)
Problem: Mobility  Goal: Patient's functional status is maintained or improved  Outcome: Progressing towards goal     Problem: Safety  Goal: Patient will remain free of falls  Outcome: Maintaining     Problem: Pain/Comfort  Goal: Patient's pain or discomfort is manageable  Outcome: Maintaining     Problem: Nutrition  Goal: Patient's nutritional status is maintained or improved  Outcome: Maintaining

## 2022-03-13 NOTE — Progress Notes (Signed)
Occupational Therapy Treatment        Discharge Recommendations:  Prior Living Environment    Equipment Recommendations: Tub Transfer bench , raised toilet seat with arms    Hospital Stay Recommendations:  Encourage active participation with ADLs and OOB for meals       Treatment       03/13/22 0930   Visit Details [facility])   Visit Type [facility]) Follow up - General   Tx Prioritization 2 - High   [facility] OT Tracking   [facility] OT Tracking OT Assigned   OT Last Visit   Visit (#)  2   Precautions   Precautions used Yes   Weight Bearing Status RLE NWB   Fall Precautions General falls precautions   LDA Observation IV lines   Patient Wearing Mask No   Writer wearing PPE including Gloves;Mask   Activity Order Activity as tolerated   Pain Assessment   *Is the patient currently in pain? Yes   Additional Comments 8-9/10 RLE, provided with oxy about 1 hour prior   ADL Assessment   LE Dressing Minimal Assist   Where  LE Dressing Assessed Edge of bed;Standing   Assist Needed With: Setup;Pants over feet   Additional Comments Pt completed LB dressing with Min A (assist needed with R pant leg on/off over foot).  Otherwise, pt unable to complete further ADL's 2/2 increased pain,   Functional Transfers   Sit to Stand   (SBA t oRW)   Stand to Sit   (SBA RW)   Balance   Sitting - Static Independent ;Unsupported   Sitting - Dynamic Supervision;Unsupported   Standing - Static   (SBA supported)   Standing - Dynamic   (SBA supported)   OT Functional Outcome Measures   Functional Outcome Measures Yes   OT AM-PAC Self Care   Putting on and taking off regular lower body clothing? 3   Bathing (including washing, rinsing, drying)? 3   Toileting, which includes using toilet, bedpan, or urinal? 3   Putting on and taking off regular upper body clothing? 3   Taking care of personal grooming such as brushing teeth? 4   Eating Meals 4   Total Raw Score 20   CMS Score - Calculated 38.32%   Other Comments   Comments pt mainly limited by pain this session, however,  agreeable to complete some LB dressing.  Pt girlfriend om phone during session and indicated that she will be able to provide him with assist.  OT provided pt with ice packs for right knee and ankle at end of session.  Anticipate progression for return to prior living.   OT Treatment Assessment   Assessment Patient making progress, continue with plan of care   Recommendation   OT Discharge Recommendations Prior Living Environment   OT Discharge Equipment Recommended Tub bench   OT Hospital Stay Recommendations OOB to chair, ADL   OT needs to see patient prior to DC  No   Multidisciplinary Communication   Multidisciplinary Communication pt, OT, nursing, PT, pt girlfriend via phone         OCCUPATIONAL THERAPY PROVIDER     Electronically Signed By:   Tracie Harrier, OT    Please contact the OT pager [facility]) for all questions/concerns and/or update requests.    Timed Calculations:  Timed Codes:  12 minute ADL  Untimed Codes: 0  Unbilled Time: 0  Total Time:  12 minutues
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
## 2022-03-13 NOTE — Progress Notes (Signed)
Brief Orthopaedic Surgery Progress Note      Compartment check performed at 0318. Patient resting comfortably. Pain controlled on current regimen. Denies new numbness or tingling in the RLE.      Exam RLE:      - Splint C/D/I  - Compartments windowed through splint are soft and compressible  - No pain with passive flexion/extension of toes  - SILT over exposed toes and 1st DWS   - Active toe flexion/ extension  - capillary refill < 3 seconds        A/P:       Low suspicion for compartment syndrome at this time. Will continue to monitor.  - Please page Orthopaedic resident on-call if there is any increase in concern for compartment syndrome, specifically tense compartments, excruciating pain with passive range of motion, and increasing requirement for IV narcotics     Roger Shelter, MD  Orthopaedic Surgery Resident  3:18 AM, 03/13/2022

## 2022-03-13 NOTE — Progress Notes (Signed)
[name] arrived on [phone] from PACU at 2032.  Patient mobility ability arrived by stretcher and moved self over to bed.  [x]  New Admission  Current activity listed as: Activity as tolerated, NWB status to RLE; patient acknowledged understanding of current treatment or is unable to understand due to mental status.     Vital signs stable.     Oriented to call bell, room, unit, visiting hours, IPC, and incentive spirometer.     COVID swab sent: Yes (negative 03/11/22)    Patient arrived to unit with (Belongings): iphone    Current Lines/Drains/Devices/Braces/Collars/Tele/O2: 2 PIV    4 Eyed Skin Assessment:  Four eyed skin assessment completed with Lubertha Sayres, RN  Skin Breakdown Present: Yes []   No [x]    Location of Wound(s): R ankle surgical wound splint, dressing clean, dry, and intact  Interventions Put in Place:  []  Wound Consult Ordered  []  Low Air Loss mattress (Braden < 15)  []  Two pillow turning and repositioning  []  Protective Mepilex foam dressing  []  Prevalon boot  []  Pillow heel offloading  []  Waffle chair cushion    Patient incontinent of urine: No  patient incontinent of stool: No    [name] is currently resting with call bell in reach.      Levada Schilling, RN as of 03/13/2022 at 4:21 AM

## 2022-03-14 LAB — EKG 12-LEAD
P: 40 deg
PR: 155 ms
QRS: 49 deg
QRSD: 84 ms
QT: 378 ms
QTc: 372 ms
Rate: 61 {beats}/min
T: 60 deg

## 2022-03-14 MED ORDER — KETOROLAC TROMETHAMINE 30 MG/ML IJ SOLN *I*
15.0000 mg | Freq: Four times a day (QID) | INTRAMUSCULAR | Status: DC
Start: 2022-03-14 — End: 2022-03-15
  Administered 2022-03-14 – 2022-03-15 (×4): 15 mg via INTRAVENOUS
  Filled 2022-03-14 (×4): qty 1

## 2022-03-14 MED ORDER — OXYCODONE HCL 5 MG PO TABS *I*
15.0000 mg | ORAL_TABLET | ORAL | Status: DC | PRN
Start: 2022-03-14 — End: 2022-03-15
  Administered 2022-03-14 – 2022-03-15 (×7): 15 mg via ORAL
  Filled 2022-03-14 (×7): qty 1

## 2022-03-14 MED ORDER — OXYCODONE HCL 10 MG PO TABS *I*
10.0000 mg | ORAL_TABLET | ORAL | Status: DC | PRN
Start: 2022-03-14 — End: 2022-03-15

## 2022-03-14 NOTE — Progress Notes (Signed)
Physical Therapy Treatment Note    Therapy Recommendations:  Discharge Recommendations:  Discharge to Planned Living Arrangement:    Patient's mobility is currently a barrier to discharge. Further PT visits are needed.  Recommendations:   PT Discharge Equipment Recommended: Rolling walker (crutches (pt might have at home))   Additional justification:   N/A  PT Positioning Recommendations: OOBTC daily with NSG  PT Mobility Recommendations: mod independent with RW 2-3 times per day  PT Referral Recommendations: SW, Outpatient PT       03/14/22 1215   Prior Living    # Steps to Enter Home 3  (Pt reporting 3 non platform stairs to enter home)   PT Tracking   [facility]) PT TRACKING PT Assigned   Visit Number   Visit Number [facility]) / Treatment Day (HH) 2   Visit Details [facility])   Visit Type [facility]) Follow Up-General   Precautions/Observations   Precautions used Yes   Weight Bearing Status RLE NWB   Brace Applied Yes   Brace Type Other (comment)  (Hard splint and ace wrap to knee)   Fall Precautions General falls precautions   Activity Orders Present Yes   LDA Observation None   Was patient wearing a mask? Yes   PPE worn by writer [facility]   Patient Subjective "My pain is better than this morning"   Pain Assessment   *Is the patient currently in pain? Yes   Pain (Before,During, After) Therapy Before;During;After   0-10 Scale 4;7   Pain Location Ankle   Pain Orientation Right   Pain Radiating Towards knee   Pain Descriptors Aching;Discomfort   Pain Intervention(s) Refer to nursing for pain management;Repositioned;Cold applied;Premedicated for PT session by nursing   Vision    Current Vision Adequate for PT session   Communication   Communication Communication Style   Communication Style Verbal   Cognition   Cognition Tested   Arousal/Alertness Appropriate responses to stimuli   Ability to Follow Instructions Follows all commands and directions without difficulty   Type of Instructions Given Verbal   Bed Mobility   Bed  mobility Tested   Supine to Sit Modified independent (device);Side rails up (#);Head of bed elevated   Sit to Supine Modified independent (device);Head of bed flat;Side rails up (#)   Additional comments Pt able to complete bed mobility with increased time and effort. Pt hooks LLE and uses UEs to transition RLE to EOB into dependent position and back into bed due to weakness and pain. Pt requires cues for sequencing throughout.   Transfers   Transfers Tested   Sit to Stand Stand by assistance   Stand to sit Stand by assistance   Transfer Assistive Device rolling walker   Additional comments Pt complete sit > stand from EOB x1 and standard chair with armrests x1 with SBA to ensure stability while transitioning UE to RW. SBA provided during stand > sit transfer to ensure controlled descent to chair. Pt requires cues for proper hand placement on stationary surface during transfers.   Mobility   Mobility: Gait/Stairs Tested   Weight Bearing Status RLE NWB   Gait Pattern 2 point  (2 point with RW)   Ambulation Assist Contact guard;Modified independent (device)   Ambulation Distance (Feet) 125'x1   Ambulation Assistive Device rolling walker   Stairs Assistance Contact guard   Stair Management Technique One rail;With crutches;Forwards;Step to pattern   Number of Stairs 3   Additional comments CGA for intial ambulation for saftey with forward progression of walker,  with increased ambulation distance progresses to modified independence due to increased stability. Pt navigates 3 stairs with one crutch and L rail to mimic home setup. Requires CGA for stability LLE transition on stairs. Cues provided throughout for anterior weight shift and sequencing. Able to maintain RLE NWB throughout.   Training and Education   Patient Ambulation 2-3 times per day with RW   Balance   Balance Tested   Sitting - Static Independent   Sitting - Dynamic Independent   Standing - Static Standby assist   Standing - Dynamic Contact guard    Additional Comments No overt LOB RW for support   Functional Outcome Measures   Functional Outcome Measures Yes   PT AM-PAC Mobility   Turning over in bed? 4   Sitting down on and standing up from a chair with arms? 3   Moving from lying on back to sitting on the side of the bed? 4   Moving to and from a bed to a chair? 4   Need to walk in hospital room? 4   Climbing 3 - 5 steps with a railing? 3   Total Raw Score 22   Standardized Score - Calculated 53.28   % Functional Impairment - Calculated 21%   Assessment   Brief Assessment Appropriate for skilled therapy   Patient / Family Goal reduced pain   Overall Assessment Pts functional mobility continues to improve, limited by pain and RLE NWB status. Anticipate d/c home with further sessions to progress functional mobility.   Plan/Recommendation   PT Treatment Interventions Bed mobility training;Transfers training;Balance training;Stair training;Strengthening;Gait training;D/C planning;Will work to minimize pain while promoting mobility whenever possible   PT Frequency 3-5 x/wk   PT Positioning Recommendations OOBTC daily with NSG   PT Mobility Recommendations mod independent with RW 2-3 times per day   PT Referral Recommendations SW;Outpatient PT   PT Discharge Recommendations Anticipate return to prior living arrangement;Intermittent supervision/assist;Outpatient PT   PT Discharge Equipment Recommended Rolling walker  (crutches (pt might have at home))   Transportation Recommendations w/c   PT Assessment/Recommendations Reviewed With: Nursing;Patient   Next PT Visit Progress ambulation and stairs with least restrictive device   PT needs to see patient prior to DC  Yes   Time Calculation   Total Time Therapeutic Activities (minutes) 8   Total Time Gait Training (minutes) 21   Total Time Therapeutic Exercises (minutes) 0   Total Time Neuromuscular Re-education (minutes) 0   Total Time Group Therapy (minutes) 0   PT Timed Codes 29   PT Untimed Codes 0   PT Unbilled  Time 0   PT Total Treatment 29   Plan and Onset date   Plan of Care Date 03/13/22   Onset Date 03/11/22   Treatment Start Date 03/13/22   Karma Ganja, Student PT
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
## 2022-03-14 NOTE — Plan of Care (Signed)
Problem: Safety  Goal: Patient will remain free of falls  Outcome: Progressing towards goal     Problem: Pain/Comfort  Goal: Patient's pain or discomfort is manageable  Outcome: Progressing towards goal     Problem: Nutrition  Goal: Patient's nutritional status is maintained or improved  Outcome: Progressing towards goal     Problem: Mobility  Goal: Patient's functional status is maintained or improved  Outcome: Progressing towards goal

## 2022-03-14 NOTE — Plan of Care (Signed)
Problem: Safety  Goal: Patient will remain free of falls  Outcome: Maintaining     Problem: Pain/Comfort  Goal: Patient's pain or discomfort is manageable  Outcome: Progressing towards goal     Problem: Nutrition  Goal: Patient's nutritional status is maintained or improved  Outcome: Maintaining     Problem: Mobility  Goal: Patient's functional status is maintained or improved  Outcome: Progressing towards goal

## 2022-03-15 ENCOUNTER — Other Ambulatory Visit: Payer: Self-pay

## 2022-03-15 LAB — BASIC METABOLIC PANEL
Anion Gap: 10 (ref 7–16)
CO2: 27 mmol/L (ref 20–28)
Calcium: 9 mg/dL (ref 9.0–10.3)
Chloride: 104 mmol/L (ref 96–108)
Creatinine: 1.11 mg/dL (ref 0.67–1.17)
Glucose: 78 mg/dL (ref 60–99)
Lab: 8 mg/dL (ref 6–20)
Potassium: 3.9 mmol/L (ref 3.3–5.1)
Sodium: 141 mmol/L (ref 133–145)
eGFR BY CREAT: 88 *

## 2022-03-15 LAB — MCHC: MCHC: 33 g/dL (ref 32–37)

## 2022-03-15 LAB — HEMATOCRIT: Hematocrit: 32 % — ABNORMAL LOW (ref 40–51)

## 2022-03-15 MED ORDER — RAISED TOILET SEAT MISC *A*
0 refills | Status: DC
Start: 2022-03-15 — End: 2024-05-07

## 2022-03-15 MED ORDER — OXYCODONE HCL 10 MG PO TABS *I*
10.0000 mg | ORAL_TABLET | ORAL | 0 refills | Status: DC | PRN
Start: 2022-03-15 — End: 2022-03-16
  Filled 2022-03-15: qty 42, 7d supply, fill #0

## 2022-03-15 MED ORDER — CRUTCHES-ALUMINUM MISC *A*
0 refills | Status: DC
Start: 2022-03-15 — End: 2024-05-07

## 2022-03-15 MED ORDER — HOME AMBULATORY AND BATH AIDS DME MISC *A*
0 refills | Status: DC
Start: 2022-03-15 — End: 2024-05-07

## 2022-03-15 MED ORDER — SENNOSIDES 8.6 MG PO TABS *I*
2.0000 | ORAL_TABLET | Freq: Every day | ORAL | 0 refills | Status: DC
Start: 2022-03-16 — End: 2023-07-31
  Filled 2022-03-15: qty 14, 7d supply, fill #0

## 2022-03-15 MED ORDER — ADJUST BATH/SHOWER SEAT MISC *A*
0 refills | Status: DC
Start: 2022-03-15 — End: 2022-03-15

## 2022-03-15 MED ORDER — ASPIRIN 325 MG PO TBEC *I*
325.0000 mg | DELAYED_RELEASE_TABLET | Freq: Every day | ORAL | 0 refills | Status: DC
Start: 2022-03-15 — End: 2022-03-16
  Filled 2022-03-15: qty 30, 30d supply, fill #0

## 2022-03-15 MED ORDER — ACETAMINOPHEN 500 MG PO TABS *I*
1000.0000 mg | ORAL_TABLET | Freq: Three times a day (TID) | ORAL | 0 refills | Status: DC
Start: 2022-03-15 — End: 2022-03-16
  Filled 2022-03-15: qty 100, 17d supply, fill #0

## 2022-03-15 NOTE — Progress Notes (Addendum)
ACC note- [employer] notified for crutches per PT recommendations. APP will e scribe the script, pending insurance.  1:00pm Addendum-I briefly met with pt as [employer] contacted me that pt wanted a shower chair. Pt states his PT mentioned this but it was not recommended in the note. I did tell pt he could get one on [employer] or order one that is not too expensive. I was not able to assist with providing any home equipment unless it was recommended by the therapist.   The crutches should be delivered to the room by 2pm.   1:15pm Addendum-Just saw OT note, TTB and RTS with arms recommended, [employer] notified. Will ask for SW voucher , if needed due to financial burden for pt if he insurance can not cover. APP will e scribe DME.      Shirlee More RN BSN  Acute Care Coordinator  Unit [phone], SDAU/PACU  [phone]   Weekend covering pager [phone]  (assisting ACC)
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
## 2022-03-15 NOTE — Plan of Care (Signed)
Problem: Pain/Comfort  Goal: Patient's pain or discomfort is manageable  Outcome: Progressing towards goal     Problem: Mobility  Goal: Patient's functional status is maintained or improved  Outcome: Progressing towards goal     Problem: Safety  Goal: Patient will remain free of falls  Outcome: Maintaining     Problem: Nutrition  Goal: Patient's nutritional status is maintained or improved  Outcome: Maintaining

## 2022-03-15 NOTE — Progress Notes (Signed)
Reviewed 1082 and AVS with patient. No questions or concerns voiced. Verbalized understanding of all. All paperwork signed by patient, and was given a copy.     PIV was removed and intact.    Patient dressed appropriately for the weather and discharged without issue.     Patient is being discharged with all belongings in his possession    Accompanied by: significant other. Mode of transportation: wheelchair to car, discharge to home.       Signed by: Janyth Pupa, RN as of 03/15/2022 at 2:10 PM
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
## 2022-03-15 NOTE — Progress Notes (Signed)
Physical Therapy Treatment Note    Therapy Recommendations:  Discharge Recommendations:  Patient demonstrates adequate mobility to be discharged to their planned living arrangement. No further PT visits needed.   Recommendations:   PT Discharge Equipment Recommended: Crutches   Additional justification:   N/A  PT Positioning Recommendations: OOBTC daily  PT Mobility Recommendations: (P) Modified independent with crutches 2-3 times per day  PT Referral Recommendations: SW, Outpatient PT       03/15/22 0845   PT Tracking   ([facility]) PT TRACKING PT Discontinue   Visit Number   Visit Number [facility]) / Treatment Day ([facility]) 0   Visit Details [facility])   Visit Type ([facility]) Follow Up-Home D/C   Precautions/Observations   Precautions used Yes   Weight Bearing Status RLE NWB   Brace Applied Yes   Brace Type Other (comment)  (Hard splint and ace wrap to knee)   Fall Precautions General falls precautions   Activity Orders Present Yes   LDA Observation None   Was patient wearing a mask? Yes   PPE worn by writer [facility]   Patient Subjective "I have been feeling better"   Pain Assessment   *Is the patient currently in pain? Yes   Pain (Before,During, After) Therapy Before;During;After   0-10 Scale 3;5   Pain Location Ankle   Pain Orientation Right   Pain Radiating Towards Knee   Pain Descriptors Aching;Discomfort   Pain Intervention(s) Repositioned;Refer to nursing for pain management   Vision    Current Vision Adequate for PT session   Communication   Communication Communication Style   Communication Style Verbal   Cognition   Cognition Tested   Arousal/Alertness Appropriate responses to stimuli   Ability to Follow Instructions Follows all commands and directions without difficulty   Type of Instructions Given Verbal   Bed Mobility   Bed mobility Tested   Supine to Sit Independent;Head of bed flat;Side rails down   Sit to Supine Independent;Head of bed flat;Side rails down   Additional comments Pt able to complete bed mobility  with reduced effort and time today. Pt hooks LLE under his RLE to lift in and out of bed.   Transfers   Transfers Tested   Sit to Stand Modified independent (device)   Stand to sit Modified independent (device)   Transfer Assistive Device rolling walker;crutches   Additional comments Pt completes sit <> stand from EOB with RW x1 and with crutches from standard chair x1. Pt is able to manage crutches during transitions. Cues provided for hand placement during transitions, pt able to demonstrate safety.   Mobility   Mobility: Gait/Stairs Tested   Weight Bearing Status RLE NWB   Gait Pattern 2 point  (2 point with RW and crutches)   Ambulation Assist Modified independent (device)   Ambulation Distance (Feet) 110'x1, 50'x1   Ambulation Assistive Device crutches;rolling walker   Stairs Assistance Modified independent (device)   Stair Management Technique One rail;With crutches;Forwards   Number of Stairs 5   Additional comments Pt ambulates 110' with RW with good control and Ue strength, progressed to crutches for 50' and demonstrates good stability. Pt reporting he would prefer to ambulate with crutches at home. Pt demonstrates good stability during stair navigation with L rail and one crutch. Pt able to maintain RLE NWB status throughout.   Training and Education   Patient Ambulation 2-3 times per day with crutches   Therapeutic Exercises / Neuromuscular Re-education   Exercises Performed Bed/Mat   Bed / Mat  Pt completes each of the following x10 each: toe crunches, quad set, heel slides, and glute set   Additional comments Educated on importance of completing 2-3 times per day.   Balance   Balance Tested   Sitting - Static Independent   Sitting - Dynamic Independent   Standing - Static Independent   Standing - Dynamic Independent   Additional Comments pt has no overt LOB   PT AM-PAC Mobility   Turning over in bed? 4   Sitting down on and standing up from a chair with arms? 4   Moving from lying on back to sitting on  the side of the bed? 4   Moving to and from a bed to a chair? 4   Need to walk in hospital room? 4   Climbing 3 - 5 steps with a railing? 4   Total Raw Score 24   Standardized Score - Calculated 61.14   % Functional Impairment - Calculated 0%   Assessment   Brief Assessment Patient demonstrates adequate mobility to discharge to planned living arrangement   Patient / Family Goal To go home   Overall Assessment Pt demonstrates safety with all functional mobility, will be safe to d/c home with crutches and pts significnat other states she can provide intermittent assist if needed.   Plan/Recommendation   PT Treatment Interventions No further PT interventions   PT Frequency none further   PT Positioning Recommendations OOBTC daily   PT Mobility Recommendations Modified independent with crutches 2-3 times per day   PT Referral Recommendations SW;Outpatient PT   PT Discharge Recommendations Anticipate return to prior living arrangement;Outpatient PT   PT Discharge Equipment Recommended Crutches   Transportation Recommendations w/c   PT Assessment/Recommendations Reviewed With: Nursing;Patient   PT needs to see patient prior to DC  No   Time Calculation   Total Time Therapeutic Activities (minutes) 2   Total Time Gait Training (minutes) 14   Total Time Therapeutic Exercises (minutes) 8   Total Time Neuromuscular Re-education (minutes) 0   Total Time Group Therapy (minutes) 0   PT Timed Codes 24   PT Untimed Codes 0   PT Unbilled Time 0   PT Total Treatment 24   Plan and Onset date   Plan of Care Date 03/13/22   Onset Date 03/11/22   Treatment Start Date 03/13/22   Karma Ganja, Student PT

## 2022-03-15 NOTE — Plan of Care (Signed)
Problem: Impaired Bed Mobility  Goal: STG - IMPROVE BED MOBILITY  Outcome: Completed or Resolved     Problem: Impaired Transfers  Goal: STG - IMPROVE TRANSFERS  Outcome: Completed or Resolved     Problem: Impaired Ambulation  Goal: LTG - IMPROVE AMBULATION  Outcome: Completed or Resolved  Note:        Problem: Impaired Stair Navigation  Goal: LTG - IMPROVE STAIR NAVIGATION  Outcome: Completed or Resolved   Karma Ganja, Student PT

## 2022-03-16 ENCOUNTER — Telehealth: Payer: Self-pay | Admitting: Family

## 2022-03-16 ENCOUNTER — Other Ambulatory Visit: Payer: Self-pay | Admitting: Student in an Organized Health Care Education/Training Program

## 2022-03-16 ENCOUNTER — Other Ambulatory Visit: Payer: Self-pay | Admitting: Family

## 2022-03-16 MED ORDER — IBUPROFEN 600 MG PO TABS *I*
600.0000 mg | ORAL_TABLET | Freq: Four times a day (QID) | ORAL | 1 refills | Status: AC | PRN
Start: 2022-03-16 — End: 2022-04-15

## 2022-03-16 MED ORDER — OXYCODONE HCL 5 MG PO TABS *I*
5.0000 mg | ORAL_TABLET | ORAL | 0 refills | Status: DC | PRN
Start: 2022-03-16 — End: 2022-03-21

## 2022-03-16 MED ORDER — ASPIRIN 325 MG PO TBEC *I*
325.0000 mg | DELAYED_RELEASE_TABLET | Freq: Every day | ORAL | 0 refills | Status: AC
Start: 2022-03-16 — End: 2022-04-15

## 2022-03-16 MED ORDER — ACETAMINOPHEN 500 MG PO TABS *I*: 1000.0000 mg | ORAL_TABLET | Freq: Three times a day (TID) | ORAL | 0 refills | Status: DC

## 2022-03-16 NOTE — Addendum Note (Signed)
Addended by: Alveria Apley on: 03/16/2022 03:06 PM     Modules accepted: Orders
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
## 2022-03-19 ENCOUNTER — Telehealth: Payer: Self-pay
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
## 2022-03-21 ENCOUNTER — Other Ambulatory Visit: Payer: Self-pay | Admitting: Orthopedic Surgery

## 2022-03-21 ENCOUNTER — Encounter: Payer: Self-pay | Admitting: Orthopedic Surgery

## 2022-03-21 ENCOUNTER — Ambulatory Visit: Payer: Medicaid Other | Attending: Orthopedic Surgery | Admitting: Orthopedic Surgery

## 2022-03-21 ENCOUNTER — Ambulatory Visit
Admission: RE | Admit: 2022-03-21 | Discharge: 2022-03-21 | Disposition: A | Discharge status: Home / Self Care | Payer: Medicaid Other | Source: Ambulatory Visit

## 2022-03-21 ENCOUNTER — Other Ambulatory Visit: Payer: Self-pay

## 2022-03-21 VITALS — BP 131/69 | HR 79 | Ht 71.0 in | Wt 210.0 lb

## 2022-03-21 DIAGNOSIS — S82291D Other fracture of shaft of right tibia, subsequent encounter for closed fracture with routine healing: Secondary | ICD-10-CM

## 2022-03-21 DIAGNOSIS — S82201S Unspecified fracture of shaft of right tibia, sequela: Secondary | ICD-10-CM | POA: Insufficient documentation

## 2022-03-21 DIAGNOSIS — S82191A Other fracture of upper end of right tibia, initial encounter for closed fracture: Secondary | ICD-10-CM | POA: Insufficient documentation

## 2022-03-21 DIAGNOSIS — S82251B Displaced comminuted fracture of shaft of right tibia, initial encounter for open fracture type I or II: Secondary | ICD-10-CM | POA: Insufficient documentation

## 2022-03-21 DIAGNOSIS — S82391A Other fracture of lower end of right tibia, initial encounter for closed fracture: Secondary | ICD-10-CM | POA: Insufficient documentation

## 2022-03-21 DIAGNOSIS — S82401S Unspecified fracture of shaft of right fibula, sequela: Secondary | ICD-10-CM | POA: Insufficient documentation

## 2022-03-21 MED ORDER — OXYCODONE HCL 5 MG PO TABS *I*
5.0000 mg | ORAL_TABLET | ORAL | 0 refills | Status: DC | PRN
Start: 2022-03-21 — End: 2022-03-24

## 2022-03-21 NOTE — Procedures (Unsigned)
Suture removal    Date/Time: 03/21/2022 3:40 PM    Performed by: Pennie Banter, RN  Authorized by: Edwinna Areola, PA    Universal protocol:     Procedure explained and questions answered to patient or proxy's satisfaction: yes    Location:     Location:  Lower extremity    Lower extremity location:  Leg    Leg location:  R lower leg  Procedure details:     Wound appearance:  Clean, pink and nontender (Dry and intact)    Sutures removed?:  yes    Staples removed?:  yes  Post-procedure details:     Post-removal:  No dressing applied (Given high tide boot for R leg)    Patient tolerance of procedure:  Tolerated well, no immediate complications
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
## 2022-03-23 ENCOUNTER — Encounter: Payer: Self-pay | Admitting: Internal Medicine

## 2022-03-23 ENCOUNTER — Other Ambulatory Visit: Payer: Self-pay

## 2022-03-23 ENCOUNTER — Ambulatory Visit: Payer: Medicaid Other | Admitting: Internal Medicine

## 2022-03-23 VITALS — BP 118/74 | HR 80 | Temp 98.2°F | Ht 70.98 in

## 2022-03-23 DIAGNOSIS — R0683 Snoring: Secondary | ICD-10-CM

## 2022-03-23 DIAGNOSIS — F430 Acute stress reaction: Secondary | ICD-10-CM

## 2022-03-23 DIAGNOSIS — J302 Other seasonal allergic rhinitis: Secondary | ICD-10-CM

## 2022-03-23 DIAGNOSIS — W3400XA Accidental discharge from unspecified firearms or gun, initial encounter: Secondary | ICD-10-CM

## 2022-03-23 MED ORDER — AZELASTINE HCL 0.05 % OP SOLN *I*
1.0000 [drp] | Freq: Two times a day (BID) | OPHTHALMIC | 5 refills | Status: DC
Start: 2022-03-23 — End: 2024-05-19

## 2022-03-23 MED ORDER — FLUOXETINE HCL 20 MG PO CAPS *I*
20.0000 mg | ORAL_CAPSULE | Freq: Every day | ORAL | 1 refills | Status: DC
Start: 2022-03-23 — End: 2022-06-25

## 2022-03-23 MED ORDER — FLUTICASONE PROPIONATE 50 MCG/ACT NA SUSP *I*
1.0000 | Freq: Every day | NASAL | 6 refills | Status: DC
Start: 2022-03-23 — End: 2024-05-19

## 2022-03-23 MED ORDER — PRAZOSIN HCL 1 MG PO CAPS *I*
1.0000 mg | ORAL_CAPSULE | Freq: Every evening | ORAL | 1 refills | Status: DC
Start: 2022-03-23 — End: 2022-06-25

## 2022-03-23 MED ORDER — HYDROXYZINE HCL 50 MG PO TABS *I*
50.0000 mg | ORAL_TABLET | Freq: Three times a day (TID) | ORAL | 1 refills | Status: DC | PRN
Start: 2022-03-23 — End: 2022-08-27

## 2022-03-23 NOTE — Patient Instructions (Signed)
FLUOXETINE (PROZAC)  -TAKE 1 PILL EVERY DAY  -THIS IS YOUR MAINTENANCE MOOD MEDICATION    HYDROXYZINE  -TAKE 1 TABLET THREE TIMES DAILY WHEN YOU ARE REALLY ANXIOUS OR HAVING A PANIC ATTACK    PRAZOSIN  -TAKE 1 TABLET AT NIGHT BEFORE BED  -THIS IS TO HELP WITH SLEEP AND HOPEFULLY REDUCE NIGHTMARES
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
## 2022-03-24 ENCOUNTER — Other Ambulatory Visit: Payer: Self-pay | Admitting: Orthopedic Surgery

## 2022-03-26 ENCOUNTER — Other Ambulatory Visit: Payer: Self-pay

## 2022-03-26 ENCOUNTER — Telehealth: Payer: Self-pay

## 2022-03-26 ENCOUNTER — Telehealth: Payer: Self-pay | Admitting: Orthopedic Surgery

## 2022-03-26 MED ORDER — OXYCODONE HCL 5 MG PO TABS *I*
5.0000 mg | ORAL_TABLET | Freq: Four times a day (QID) | ORAL | 0 refills | Status: DC | PRN
Start: 2022-03-26 — End: 2022-04-10

## 2022-03-26 NOTE — Telephone Encounter (Signed)
[facility] Telephone Call     Date of call: 03/26/2022    Name: [name]   DOB: 12-13-1985   MRN: [id number]     Writer called pt to schedule intake appointment, but failed to reach out by phone. Writer left a voice message to pt for scheduling.       Shawnie Dapper, MFT

## 2022-03-26 NOTE — Telephone Encounter (Signed)
Pharmacy sent fax, indicating that medication is not covered. If you wish to prescribe, then prior authorization is required or you can prescribe preferred medication by insurance.

## 2022-03-27 ENCOUNTER — Telehealth: Payer: Self-pay

## 2022-03-27 ENCOUNTER — Encounter: Payer: Self-pay | Admitting: Orthopedic Surgery

## 2022-03-27 NOTE — Telephone Encounter (Signed)
[facility] Telephone Call     Date of call: 03/27/2022    Name: [name]   DOB: April 14, 1986   MRN: [id number]     Pt returned phone call to schedule an initial appointment for therapy. Writer has scheduled pt on 5/26 at 12 PM.    Shawnie Dapper, MFT

## 2022-04-02 ENCOUNTER — Other Ambulatory Visit: Payer: Self-pay | Admitting: Orthopedic Surgery

## 2022-04-03 ENCOUNTER — Other Ambulatory Visit: Payer: Self-pay

## 2022-04-03 ENCOUNTER — Encounter: Payer: Self-pay | Admitting: Orthopedic Surgery

## 2022-04-03 MED ORDER — OXYCODONE HCL 5 MG PO TABS *I*
5.0000 mg | ORAL_TABLET | Freq: Three times a day (TID) | ORAL | 0 refills | Status: DC | PRN
Start: 2022-04-03 — End: 2022-04-19

## 2022-04-03 MED ORDER — OXYCODONE HCL 5 MG PO TABS *I*
5.0000 mg | ORAL_TABLET | Freq: Three times a day (TID) | ORAL | 0 refills | Status: DC | PRN
Start: 2022-04-03 — End: 2022-04-03
  Filled 2022-04-03: qty 21, 7d supply, fill #0

## 2022-04-05 ENCOUNTER — Other Ambulatory Visit: Payer: Self-pay | Admitting: Orthopedic Surgery

## 2022-04-05 ENCOUNTER — Encounter: Payer: Self-pay | Admitting: Orthopedic Surgery

## 2022-04-05 DIAGNOSIS — S82291D Other fracture of shaft of right tibia, subsequent encounter for closed fracture with routine healing: Secondary | ICD-10-CM

## 2022-04-06 ENCOUNTER — Ambulatory Visit: Payer: Medicaid Other | Attending: Psychiatry

## 2022-04-06 ENCOUNTER — Encounter: Payer: Self-pay | Admitting: Internal Medicine

## 2022-04-06 ENCOUNTER — Other Ambulatory Visit: Payer: Self-pay

## 2022-04-06 ENCOUNTER — Ambulatory Visit: Payer: Medicaid Other | Admitting: Internal Medicine

## 2022-04-06 ENCOUNTER — Ambulatory Visit
Admission: RE | Admit: 2022-04-06 | Discharge: 2022-04-06 | Disposition: A | Discharge status: Home / Self Care | Payer: Medicaid Other | Source: Ambulatory Visit

## 2022-04-06 ENCOUNTER — Ambulatory Visit
Admission: RE | Admit: 2022-04-06 | Discharge: 2022-04-06 | Disposition: A | Discharge status: Home / Self Care | Payer: Medicaid Other | Source: Ambulatory Visit | Attending: Orthopedic Surgery | Admitting: Orthopedic Surgery

## 2022-04-06 VITALS — BP 142/95 | HR 90 | Temp 98.2°F

## 2022-04-06 DIAGNOSIS — S82291D Other fracture of shaft of right tibia, subsequent encounter for closed fracture with routine healing: Secondary | ICD-10-CM | POA: Insufficient documentation

## 2022-04-06 DIAGNOSIS — F430 Acute stress reaction: Secondary | ICD-10-CM | POA: Insufficient documentation

## 2022-04-06 DIAGNOSIS — W3400XA Accidental discharge from unspecified firearms or gun, initial encounter: Secondary | ICD-10-CM
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
## 2022-04-08 ENCOUNTER — Other Ambulatory Visit: Payer: Self-pay | Admitting: Orthopedic Surgery

## 2022-04-10 ENCOUNTER — Other Ambulatory Visit: Payer: Self-pay | Admitting: Internal Medicine

## 2022-04-10 ENCOUNTER — Encounter: Payer: Self-pay | Admitting: Internal Medicine

## 2022-04-10 DIAGNOSIS — K219 Gastro-esophageal reflux disease without esophagitis: Secondary | ICD-10-CM

## 2022-04-10 MED ORDER — OXYCODONE HCL 5 MG PO TABS *I*
5.0000 mg | ORAL_TABLET | Freq: Four times a day (QID) | ORAL | 0 refills | Status: DC | PRN
Start: 2022-04-10 — End: 2022-04-19

## 2022-04-10 MED ORDER — GABAPENTIN 300 MG PO CAPSULE *I*
300.0000 mg | ORAL_CAPSULE | Freq: Three times a day (TID) | ORAL | 1 refills | Status: DC
Start: 2022-04-10 — End: 2023-02-12

## 2022-04-12 MED ORDER — OMEPRAZOLE 40 MG PO CPDR *I*
40.0000 mg | DELAYED_RELEASE_CAPSULE | Freq: Every day | ORAL | 5 refills | Status: DC
Start: 2022-04-12 — End: 2023-07-31

## 2022-04-17 ENCOUNTER — Other Ambulatory Visit: Payer: Self-pay | Admitting: Orthopedic Surgery

## 2022-04-17 DIAGNOSIS — S82251B Displaced comminuted fracture of shaft of right tibia, initial encounter for open fracture type I or II: Secondary | ICD-10-CM

## 2022-04-18 ENCOUNTER — Encounter: Payer: Self-pay | Admitting: Orthopedic Surgery

## 2022-04-18 ENCOUNTER — Other Ambulatory Visit: Payer: Self-pay

## 2022-04-18 ENCOUNTER — Ambulatory Visit: Payer: Medicaid Other | Attending: Orthopedic Surgery | Admitting: Orthopedic Surgery

## 2022-04-18 ENCOUNTER — Ambulatory Visit
Admission: RE | Admit: 2022-04-18 | Discharge: 2022-04-18 | Disposition: A | Discharge status: Home / Self Care | Payer: Medicaid Other | Source: Ambulatory Visit

## 2022-04-18 VITALS — BP 128/72 | Ht 71.0 in | Wt 208.0 lb

## 2022-04-18 DIAGNOSIS — S82251B Displaced comminuted fracture of shaft of right tibia, initial encounter for open fracture type I or II: Secondary | ICD-10-CM

## 2022-04-19 ENCOUNTER — Encounter: Payer: Self-pay | Admitting: Pain Medicine

## 2022-04-19 ENCOUNTER — Ambulatory Visit: Payer: Medicaid Other | Admitting: Pain Medicine

## 2022-04-19 VITALS — BP 134/100 | HR 76 | Temp 97.6°F | Resp 16

## 2022-04-19 DIAGNOSIS — M25561 Pain in right knee: Secondary | ICD-10-CM

## 2022-04-19 DIAGNOSIS — M792 Neuralgia and neuritis, unspecified: Secondary | ICD-10-CM
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
## 2022-04-20 ENCOUNTER — Telehealth: Payer: Self-pay

## 2022-04-20 ENCOUNTER — Ambulatory Visit: Payer: Medicaid Other

## 2022-04-20 MED ORDER — GABAPENTIN 300 MG PO CAPSULE *I*
600.0000 mg | ORAL_CAPSULE | Freq: Three times a day (TID) | ORAL | 1 refills | Status: DC
Start: 2022-04-20 — End: 2023-07-31

## 2022-04-20 MED ORDER — CELECOXIB 200 MG PO CAPS *I*
200.0000 mg | ORAL_CAPSULE | Freq: Every day | ORAL | 1 refills | Status: DC
Start: 2022-04-20 — End: 2022-06-25

## 2022-04-20 NOTE — Telephone Encounter (Signed)
[facility] MISSED/CANCELLED APPOINTMENT     Name: [name]  MRN: [id number]   DOB: [birth year]    Date of Scheduled Service: 6/9 at 9 AM    [name] was a no show for today's appointment.  I have spoken with the patient by phone.  The patient stated the reason for today's absence is his foot swelling and could not come..    Additional Information:    Pt has rescheduled on 6/23 at 9 AM.     Shawnie Dapper, MFT

## 2022-05-02 ENCOUNTER — Ambulatory Visit: Payer: Medicaid Other | Admitting: Rehabilitative and Restorative Service Providers"
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
## 2022-05-04 ENCOUNTER — Ambulatory Visit: Payer: Medicaid Other

## 2022-05-04 ENCOUNTER — Ambulatory Visit: Payer: Medicaid Other | Admitting: Internal Medicine

## 2022-05-04 ENCOUNTER — Telehealth: Payer: Self-pay

## 2022-05-04 DIAGNOSIS — F430 Acute stress reaction: Secondary | ICD-10-CM

## 2022-05-04 DIAGNOSIS — W3400XA Accidental discharge from unspecified firearms or gun, initial encounter: Secondary | ICD-10-CM

## 2022-05-04 NOTE — Telephone Encounter (Signed)
[facility] MISSED/CANCELLED APPOINTMENT     Name: [name]  MRN: [id number]   DOB: 11-08-1986    Date of Scheduled Service: 6/23 at 9 AM    [name] was a no show for today's appointment.  I have left the patient a message to contact me.    Additional Information:    Not applicable     Shawnie Dapper, MFT
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
## 2022-05-09 ENCOUNTER — Ambulatory Visit: Payer: Medicaid Other | Admitting: Rehabilitative and Restorative Service Providers"

## 2022-05-18 ENCOUNTER — Ambulatory Visit: Payer: Medicaid Other

## 2022-05-22 ENCOUNTER — Ambulatory Visit: Payer: Medicaid Other | Admitting: Rehabilitative and Restorative Service Providers"

## 2022-05-29 ENCOUNTER — Ambulatory Visit: Payer: Medicaid Other

## 2022-05-29 ENCOUNTER — Ambulatory Visit: Payer: Medicaid Other | Attending: Psychiatry

## 2022-05-29 DIAGNOSIS — F430 Acute stress reaction: Secondary | ICD-10-CM | POA: Insufficient documentation
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
## 2022-06-06 ENCOUNTER — Ambulatory Visit: Payer: Medicaid Other | Admitting: Rehabilitative and Restorative Service Providers"

## 2022-06-12 ENCOUNTER — Ambulatory Visit: Payer: Medicaid Other | Attending: Psychiatry

## 2022-06-12 DIAGNOSIS — F430 Acute stress reaction: Secondary | ICD-10-CM | POA: Insufficient documentation

## 2022-06-12 NOTE — Progress Notes (Signed)
Clinical Management Note     Writer had to reschedule follow up appointment in three weeks due to illness.     Shawnie Dapper, MFT

## 2022-06-18 ENCOUNTER — Other Ambulatory Visit: Payer: Self-pay | Admitting: Orthopedic Surgery

## 2022-06-18 DIAGNOSIS — S82251B Displaced comminuted fracture of shaft of right tibia, initial encounter for open fracture type I or II: Secondary | ICD-10-CM

## 2022-06-20 ENCOUNTER — Ambulatory Visit: Payer: Medicaid Other | Admitting: Orthopedic Surgery

## 2022-06-21 ENCOUNTER — Ambulatory Visit: Payer: Medicaid Other | Attending: Orthopedic Surgery

## 2022-06-21 DIAGNOSIS — S82201D Unspecified fracture of shaft of right tibia, subsequent encounter for closed fracture with routine healing: Secondary | ICD-10-CM | POA: Insufficient documentation
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
## 2022-06-23 ENCOUNTER — Other Ambulatory Visit: Payer: Self-pay | Admitting: Internal Medicine

## 2022-06-23 ENCOUNTER — Other Ambulatory Visit: Payer: Self-pay | Admitting: Orthopedic Surgery

## 2022-06-23 DIAGNOSIS — F430 Acute stress reaction: Secondary | ICD-10-CM

## 2022-06-25 ENCOUNTER — Encounter: Payer: Self-pay | Admitting: Internal Medicine

## 2022-06-27 ENCOUNTER — Other Ambulatory Visit: Payer: Self-pay

## 2022-06-27 ENCOUNTER — Ambulatory Visit: Payer: Medicaid Other

## 2022-06-27 DIAGNOSIS — S82201D Unspecified fracture of shaft of right tibia, subsequent encounter for closed fracture with routine healing: Secondary | ICD-10-CM

## 2022-06-27 NOTE — Progress Notes (Signed)
[facility] Orthopedic Sports/Spine Rehabilitation  PT Treatment Note    Today?s Date: 06/27/2022    Name: [name]  DOB: 06/09/1986  Referring Physician: Jerolyn Center, MD  Diagnosis:   1. Closed traumatic displaced fracture of shaft of right tibia with routine healing, subsequent encounter          Procedure (if applicable):   1. Intramedullary fixation of right open tibial shaft fracture  2. Irrigation and debridement with closure of right traumatic lower extremity wounds associated with open fracture  Onset Date: 03/11/22  Date of surgery: 03/13/22    Visit #: 2    Subjective:  Pain Assessment: 5  Pt reports no significant changes in pain or function since previous treatment session. [name] reports he is doing ok. Reports continued limitations in gait stability secondary to sympomts.     Objective:   ROM -  Right, Knee, Right, Ankle,    LEFT RIGHT   Knee PROM AROM PROM AROM   Flexion  125  120   Extension  5HE  Pre 0  Post 5 HE                   ANKLE LEFT RIGHT    PROM AROM PROM AROM   DF (knee ext)  12  Pre 0  Post 10   DF (knee flex)       PF  45  40   INV  22  24   EV  5  10     Strength - Therapeutic Exercises per flowsheet, Functional activities as charted  Function: - Unchanged  Education:  Updated HEP, Verbal cues for ther ex, Manual cues for ther ex    Objective        Treatment:  Ther Exercise per flowsheet   WEIGHTBEARING STATUS as tolerated       Bike 5"       Heel prop 3 x 3' #5   Supine HS stretch 10"x10   Calf stretch with strap 10"x10   SAQ 5"x30 #3   S/L Clamshell 10"X10   Standing TKE 5"x30       Manual TC DF MWM 3x10   Seated DF stretch with slider 10"x10   Seated Eccentric Calf raise 2" 3x15       Mini Squat to box 3x10 #20 DB R UE           Assessment:   [name] tolerated session well overall. Demonstrates improvements in DF and knee mobility following interventions today. Significant WB'ing limitations noted in standing with squatting progression. HEP updated to include above. Continue  POC and progress as tolerated.       Plan of Care:  Continue per Plan of care -  As written; Patient would benefit from skilled rehabilitation services to address the above impairments to restore functional capacity.    Thank you for referring this patient to [facility] of [facility] Orthopaedics - Sports and Spine Rehabilitation    Wynonia Lawman, PT,DPT       Minutes   Time Based CPT  Physical Performance test, Therapeutic Exercise, Therapeutic Activities, NM Re-Education, Manual Therapy, Gait Training, Massage, Aquatic Therapy, Canalith Repositioning, Iontophoresis, Ultrasound, Orthotic fitting/training, Prosthetic fitting 40 2 TE 1 MT   Service Based (untimed) CPT  PT/OT evaluation, PT/OT re-eval, E-stim-unattended, Mechanical traction, Vasopneumatic device    Unbilled time (rest, etc)        Total Treatment Time 40

## 2022-07-03 ENCOUNTER — Ambulatory Visit: Payer: Medicaid Other | Admitting: Pain Medicine

## 2022-07-03 ENCOUNTER — Ambulatory Visit: Payer: Medicaid Other

## 2022-07-03 DIAGNOSIS — F430 Acute stress reaction: Secondary | ICD-10-CM
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
## 2022-07-04 ENCOUNTER — Other Ambulatory Visit: Payer: Self-pay

## 2022-07-04 ENCOUNTER — Ambulatory Visit: Payer: Medicaid Other | Attending: Orthopedic Surgery | Admitting: Orthopedic Surgery

## 2022-07-04 ENCOUNTER — Ambulatory Visit
Admission: RE | Admit: 2022-07-04 | Discharge: 2022-07-04 | Disposition: A | Discharge status: Home / Self Care | Payer: Medicaid Other | Source: Ambulatory Visit

## 2022-07-04 ENCOUNTER — Encounter: Payer: Self-pay | Admitting: Orthopedic Surgery

## 2022-07-04 ENCOUNTER — Ambulatory Visit: Payer: Medicaid Other

## 2022-07-04 VITALS — BP 123/88 | HR 75 | Ht 71.0 in | Wt 208.0 lb

## 2022-07-04 DIAGNOSIS — S82201D Unspecified fracture of shaft of right tibia, subsequent encounter for closed fracture with routine healing: Secondary | ICD-10-CM

## 2022-07-04 DIAGNOSIS — S82251B Displaced comminuted fracture of shaft of right tibia, initial encounter for open fracture type I or II: Secondary | ICD-10-CM | POA: Insufficient documentation

## 2022-07-04 DIAGNOSIS — S82301D Unspecified fracture of lower end of right tibia, subsequent encounter for closed fracture with routine healing: Secondary | ICD-10-CM

## 2022-07-04 MED ORDER — OTHER SUPPLY *A*
0 refills | Status: DC
Start: 2022-07-04 — End: 2024-05-07
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
## 2022-07-04 NOTE — Progress Notes (Signed)
[facility] Orthopedic Sports/Spine Rehabilitation  PT Treatment Note    Today?s Date: 07/04/2022    Name: [name]  DOB: May 14, 1986  Referring Physician: Jerolyn Center, MD  Diagnosis:   1. Closed traumatic displaced fracture of shaft of right tibia with routine healing, subsequent encounter          Procedure (if applicable):   1. Intramedullary fixation of right open tibial shaft fracture  2. Irrigation and debridement with closure of right traumatic lower extremity wounds associated with open fracture  Onset Date: 03/11/22  Date of surgery: 03/13/22    Visit #: 3    Subjective:  Pain Assessment: 3  Pt reports an improvement in pain intensity and pain frequency since previous treatment session. [name] reports he is doing well has weaned from crutches completely secondary to improvements.     Objective:   ROM -  Right, Knee, Right, Ankle,    LEFT RIGHT   Knee PROM AROM PROM AROM   Flexion  125  120   Extension  5HE  Pre 0  Post 5 HE                   ANKLE LEFT RIGHT    PROM AROM PROM AROM   DF (knee ext)  12  Pre 10  Post 13   DF (knee flex)       PF  45  40   INV  22  24   EV  5  10     Strength - Therapeutic Exercises per flowsheet, Functional activities as charted  Function: - Unchanged  Education:  Updated HEP, Verbal cues for ther ex, Manual cues for ther ex    Objective        Treatment:  Ther Exercise per flowsheet   WEIGHTBEARING STATUS as tolerated       Bike 5"       Heel prop 3 x 3' #5   SAQ 5"x30 #3   LAQ 3x10 in available rainge   Standing TKE 5"x30       Manual TC DF MWM 3x10   Seated DF stretch with slider 10"x10   Seated Eccentric Calf raise 2" 3x15       Mini Squat  3x10 #20 DB R UE   Clock Step 3x10   Leg Press #90 4x6-8               Assessment:   Tyrome tolerated session well overall. Demonstrates improvements in DF and knee mobility following interventions today. Improvements in WB'ing noted with WS progression in standing. HEP updated to include above. Continue POC and progress as  tolerated.       Plan of Care:  Continue per Plan of care -  As written; Patient would benefit from skilled rehabilitation services to address the above impairments to restore functional capacity.    Thank you for referring this patient to [facility] Orthopaedics - Sports and Spine Rehabilitation    Wynonia Lawman, PT,DPT       Minutes   Time Based CPT  Physical Performance test, Therapeutic Exercise, Therapeutic Activities, NM Re-Education, Manual Therapy, Gait Training, Massage, Aquatic Therapy, Canalith Repositioning, Iontophoresis, Ultrasound, Orthotic fitting/training, Prosthetic fitting 40 2 TE 1 MT   Service Based (untimed) CPT  PT/OT evaluation, PT/OT re-eval, E-stim-unattended, Mechanical traction, Vasopneumatic device    Unbilled time (rest, etc)        Total Treatment Time 40

## 2022-07-10 ENCOUNTER — Ambulatory Visit: Payer: Medicaid Other | Admitting: Pain Medicine

## 2022-07-10 ENCOUNTER — Ambulatory Visit: Payer: Medicaid Other

## 2022-07-10 DIAGNOSIS — F430 Acute stress reaction: Secondary | ICD-10-CM

## 2022-07-10 NOTE — Progress Notes (Signed)
Clinical Management Note     Pt arrived late and stated he had to reschedule appointment due to emergency. Pt has been rescheduled in two weeks.     Shawnie Dapper, MFT

## 2022-07-11 ENCOUNTER — Encounter: Payer: Self-pay | Admitting: Pain Medicine

## 2022-07-11 ENCOUNTER — Ambulatory Visit: Payer: Medicaid Other

## 2022-07-11 NOTE — Progress Notes (Deleted)
[facility] Orthopedic Sports/Spine Rehabilitation  PT Treatment Note    Today's Date: 07/11/2022    Name: [name]  DOB: Aug 03, 1986  Referring Physician: Jerolyn Center, MD  Diagnosis:   1. Closed traumatic displaced fracture of shaft of right tibia with routine healing, subsequent encounter          Procedure (if applicable):   1. Intramedullary fixation of right open tibial shaft fracture  2. Irrigation and debridement with closure of right traumatic lower extremity wounds associated with open fracture  Onset Date: 03/11/22  Date of surgery: 03/13/22    Visit #: 4    Subjective:  Pain Assessment: 3  Pt reports an improvement in pain intensity and pain frequency since previous treatment session. [name] reports he is doing well has weaned from crutches completely secondary to improvements.     Objective:   ROM -  Right, Knee, Right, Ankle,    LEFT RIGHT   Knee PROM AROM PROM AROM   Flexion  125  120   Extension  5HE  Pre 0  Post 5 HE                   ANKLE LEFT RIGHT    PROM AROM PROM AROM   DF (knee ext)  12  Pre 10  Post 13   DF (knee flex)       PF  45  40   INV  22  24   EV  5  10     Strength - Therapeutic Exercises per flowsheet, Functional activities as charted  Function: - Unchanged  Education:  Updated HEP, Verbal cues for ther ex, Manual cues for ther ex    Objective        Treatment:  Ther Exercise per flowsheet   WEIGHTBEARING STATUS as tolerated       Bike 5"       Heel prop 3 x 3' #5   SAQ 5"x30 #3   LAQ 3x10 in available rainge   Standing TKE 5"x30       Manual TC DF MWM 3x10   Seated DF stretch with slider 10"x10   Seated Eccentric Calf raise 2" 3x15       Mini Squat  3x10 #20 DB R UE   Clock Step 3x10   Leg Press #90 4x6-8               Assessment:   [name] tolerated session well overall. Demonstrates improvements in DF and knee mobility following interventions today. Improvements in WB'ing noted with WS progression in standing. HEP updated to include above. Continue POC and progress as  tolerated.       Plan of Care:  Continue per Plan of care -  As written; Patient would benefit from skilled rehabilitation services to address the above impairments to restore functional capacity.    Thank you for referring this patient to [facility] Orthopaedics - Sports and Spine Rehabilitation    Wynonia Lawman, PT,DPT       Minutes   Time Based CPT  Physical Performance test, Therapeutic Exercise, Therapeutic Activities, NM Re-Education, Manual Therapy, Gait Training, Massage, Aquatic Therapy, Canalith Repositioning, Iontophoresis, Ultrasound, Orthotic fitting/training, Prosthetic fitting 40 2 TE 1 MT   Service Based (untimed) CPT  PT/OT evaluation, PT/OT re-eval, E-stim-unattended, Mechanical traction, Vasopneumatic device    Unbilled time (rest, etc)        Total Treatment Time 40

## 2022-07-19 ENCOUNTER — Ambulatory Visit: Payer: Medicaid Other

## 2022-07-19 NOTE — Progress Notes (Unsigned)
[facility] Orthopedic Sports/Spine Rehabilitation  PT Treatment Note    Today's Date: 07/19/2022    Name: Krag Adamowicz  DOB: [birth year]  Referring Physician: Jerolyn Center, MD  Diagnosis:   1. Closed traumatic displaced fracture of shaft of right tibia with routine healing, subsequent encounter          Procedure (if applicable):   1. Intramedullary fixation of right open tibial shaft fracture  2. Irrigation and debridement with closure of right traumatic lower extremity wounds associated with open fracture  Onset Date: 03/11/22  Date of surgery: 03/13/22    Visit #: 4    Subjective:  Pain Assessment: 3  Pt reports an improvement in pain intensity and pain frequency since previous treatment session. Lealon reports he is doing well has weaned from crutches completely secondary to improvements.     Objective:   ROM -  Right, Knee, Right, Ankle,    LEFT RIGHT   Knee PROM AROM PROM AROM   Flexion  125  120   Extension  5HE  Pre 0  Post 5 HE                   ANKLE LEFT RIGHT    PROM AROM PROM AROM   DF (knee ext)  12  Pre 10  Post 13   DF (knee flex)       PF  45  40   INV  22  24   EV  5  10     Strength - Therapeutic Exercises per flowsheet, Functional activities as charted  Function: - Unchanged  Education:  Updated HEP, Verbal cues for ther ex, Manual cues for ther ex    Objective        Treatment:  Ther Exercise per flowsheet   WEIGHTBEARING STATUS as tolerated       Bike 5"       Heel prop 3 x 3' #5   SAQ 5"x30 #3   LAQ 3x10 in available rainge   Standing TKE 5"x30       Manual TC DF MWM 3x10   Seated DF stretch with slider 10"x10   Seated Eccentric Calf raise 2" 3x15       Mini Squat  3x10 #20 DB R UE   Clock Step 3x10   Leg Press #90 4x6-8               Assessment:   Arshawn tolerated session well overall. Demonstrates improvements in DF and knee mobility following interventions today. Improvements in WB'ing noted with WS progression in standing. HEP updated to include above. Continue POC and progress as  tolerated.       Plan of Care:  Continue per Plan of care -  As written; Patient would benefit from skilled rehabilitation services to address the above impairments to restore functional capacity.    Thank you for referring this patient to [facility] Orthopaedics - Sports and Spine Rehabilitation    Wynonia Lawman, PT,DPT       Minutes   Time Based CPT  Physical Performance test, Therapeutic Exercise, Therapeutic Activities, NM Re-Education, Manual Therapy, Gait Training, Massage, Aquatic Therapy, Canalith Repositioning, Iontophoresis, Ultrasound, Orthotic fitting/training, Prosthetic fitting 40 2 TE 1 MT   Service Based (untimed) CPT  PT/OT evaluation, PT/OT re-eval, E-stim-unattended, Mechanical traction, Vasopneumatic device    Unbilled time (rest, etc)        Total Treatment Time 40

## 2022-07-24 ENCOUNTER — Ambulatory Visit: Payer: Medicaid Other

## 2022-07-25 ENCOUNTER — Ambulatory Visit: Payer: Medicaid Other | Attending: Psychiatry

## 2022-07-25 DIAGNOSIS — F430 Acute stress reaction: Secondary | ICD-10-CM | POA: Insufficient documentation

## 2022-07-25 DIAGNOSIS — F4310 Post-traumatic stress disorder, unspecified: Secondary | ICD-10-CM | POA: Insufficient documentation
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
## 2022-08-02 ENCOUNTER — Ambulatory Visit: Payer: Medicaid Other

## 2022-08-06 ENCOUNTER — Ambulatory Visit: Payer: Medicaid Other
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
## 2022-08-08 ENCOUNTER — Telehealth: Payer: Self-pay

## 2022-08-08 NOTE — Telephone Encounter (Signed)
[facility] MISSED/CANCELLED APPOINTMENT     Name: [name]  MRN: [id number]   DOB: 1986-09-11    Date of Scheduled Service: 9/25 at 5 PM    [name] was a no show for today's appointment.  I have spoken with the patient by phone.  The patient stated the reason for today's absence is he forgot.    Additional Information:    Pt rescheduled in two weeks. Pt was informed that his service will be closed if no show one more time.     Shawnie Dapper, MFT

## 2022-08-20 ENCOUNTER — Telehealth: Payer: Self-pay

## 2022-08-20 ENCOUNTER — Ambulatory Visit: Payer: Medicaid Other

## 2022-08-20 NOTE — Telephone Encounter (Signed)
[facility] Telephone Call     Date of call: 08/20/2022    Name: [name]   DOB: 07-20-1986   MRN: [id number]     Writer called pt to cancel appointment today and reschedule due to writer being sick. Pt agreed to reschedule next week.     Shawnie Dapper, LMFT

## 2022-08-21 ENCOUNTER — Ambulatory Visit: Payer: Medicaid Other
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
## 2022-08-22 ENCOUNTER — Encounter: Payer: Self-pay | Admitting: Gastroenterology

## 2022-08-26 ENCOUNTER — Other Ambulatory Visit: Payer: Self-pay | Admitting: Internal Medicine

## 2022-08-26 DIAGNOSIS — F430 Acute stress reaction: Secondary | ICD-10-CM

## 2022-08-27 ENCOUNTER — Ambulatory Visit: Payer: Medicaid Other

## 2022-08-29 ENCOUNTER — Ambulatory Visit: Payer: Medicaid Other | Attending: Orthopedic Surgery

## 2022-08-29 ENCOUNTER — Other Ambulatory Visit: Payer: Self-pay

## 2022-08-29 DIAGNOSIS — S82201D Unspecified fracture of shaft of right tibia, subsequent encounter for closed fracture with routine healing: Secondary | ICD-10-CM | POA: Insufficient documentation
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
## 2022-09-11 ENCOUNTER — Ambulatory Visit: Payer: Medicaid Other

## 2022-09-24 ENCOUNTER — Other Ambulatory Visit: Payer: Self-pay | Admitting: Orthopedic Surgery

## 2022-09-24 ENCOUNTER — Other Ambulatory Visit: Payer: Self-pay | Admitting: Internal Medicine

## 2022-09-24 DIAGNOSIS — F430 Acute stress reaction: Secondary | ICD-10-CM

## 2022-09-25 ENCOUNTER — Ambulatory Visit: Payer: Medicaid Other
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
## 2022-10-03 ENCOUNTER — Encounter: Payer: Self-pay | Admitting: Gastroenterology

## 2022-10-08 ENCOUNTER — Ambulatory Visit: Payer: Medicaid Other | Admitting: Orthopedic Surgery

## 2022-10-08 ENCOUNTER — Other Ambulatory Visit: Payer: Self-pay | Admitting: Orthopedic Surgery

## 2022-10-08 DIAGNOSIS — S82251B Displaced comminuted fracture of shaft of right tibia, initial encounter for open fracture type I or II: Secondary | ICD-10-CM

## 2022-10-09 ENCOUNTER — Ambulatory Visit: Payer: Medicaid Other

## 2022-10-09 NOTE — Progress Notes (Unsigned)
[facility] Orthopedic Sports/Spine Rehabilitation  PT Treatment Note    Today's Date: 10/09/2022    Name: [name]  DOB: 07-04-1986  Referring Physician: Jerolyn Center, MD  Diagnosis:   1. Closed traumatic displaced fracture of shaft of right tibia with routine healing, subsequent encounter            Visit #: 5    Subjective:  Pain Assessment: {PAIN SCALE:23261}  {PT/OT Subjective:23896}       Objective:  ROM -  {Location:26241}, {ROM:26242}   LEFT RIGHT Strength   Knee PROM AROM PROM AROM Left Right   Flexion  125 130 117 5 4   Extension  5HE  Pre Lacking 5    Post 0 5 4                       ANKLE LEFT RIGHT STRENGTH    PROM AROM PROM AROM Left Right   DF (knee ext)  12  8 5 4    DF (knee flex)         PF  45  40 5 4-   INV  22  24 5  4-   EV  5  10 5  4-            CKC Ankle DF Knee to Wall (cm)           Strength - {Strength:26243}  Function: - {Improved/Worse/unchg:26239}  Education:  {Education:26246}    Objective        Treatment:  {Modalities:26249}  WEIGHTBEARING STATUS as tolerated       Bike 5"       Heel prop 3 x 3' #5   SAQ 5"x30 #3   LAQ 3x10 in available rainge   Standing TKE 5"x30       Seated DF stretch with slider 10"x10   Seated Eccentric Calf raise 2" 3x15       Mini Squat  3x10 #20 DB R UE   Clock Step 3x10   Leg Press #95 4x6-8           Assessment:   ***       Plan of Care:  Continue per Plan of care -  {Plan:26262}    Thank you for referring this patient to [facility] Orthopaedics - Sports and Spine Rehabilitation    Wynonia Lawman, PT,DPT       Minutes   Time Based CPT  Physical Performance test, Therapeutic Exercise, Therapeutic Activities, NM Re-Education, Manual Therapy, Gait Training, Massage, Aquatic Therapy, Canalith Repositioning, Iontophoresis, Ultrasound, Orthotic fitting/training, Prosthetic fitting ***   Service Based (untimed) CPT  PT/OT evaluation, PT/OT re-eval, E-stim-unattended, Mechanical traction, Vasopneumatic device    Unbilled time (rest,  etc)        Total Treatment Time

## 2022-11-02 ENCOUNTER — Inpatient Hospital Stay: Admit: 2022-11-02 | Discharge: 2022-11-02 | Disposition: A | Discharge status: Home / Self Care | Payer: Self-pay

## 2022-11-02 LAB — UNMAPPED LAB RESULTS
Basophil # (HT): 0.1 10 3/uL (ref 0.0–0.2)
Basophil % (HT): 0 % (ref 0–3)
Eosinophil # (HT): 0.1 10 3/uL (ref 0.0–0.6)
Eosinophil % (HT): 0 % (ref 0–5)
Hematocrit (HT): 39 % — ABNORMAL LOW (ref 40–52)
Hemoglobin (HGB) (HT): 13.2 g/dL (ref 13.0–18.0)
Lymphocyte # (HT): 2.8 10 3/uL (ref 1.0–4.8)
Lymphocyte % (HT): 18 % (ref 15–45)
MCHC (HT): 34.1 g/dL (ref 32.0–37.5)
MCV (HT): 77 fL — ABNORMAL LOW (ref 80–100)
Mean Corpuscular Hemoglobin (MCH) (HT): 26.1 pg (ref 26.0–34.0)
Monocyte # (HT): 1.3 10 3/uL — ABNORMAL HIGH (ref 0.1–1.0)
Monocyte % (HT): 8 % (ref 0–15)
Neutrophil # (HT): 11.7 10 3/uL — ABNORMAL HIGH (ref 1.8–8.0)
Platelets (HT): 375 10 3/uL (ref 150–450)
RBC (HT): 5.06 10 6/uL (ref 4.40–6.20)
RDW (HT): 13.8 % (ref 0.0–15.2)
Seg Neut % (HT): 73 % (ref 45–75)
WBC (HT): 16 10 3/uL — ABNORMAL HIGH (ref 4.0–11.0)

## 2022-11-07 ENCOUNTER — Ambulatory Visit: Payer: Medicaid Other | Attending: Physician Assistant

## 2022-11-07 ENCOUNTER — Ambulatory Visit: Payer: Auto Insurance (includes no fault) | Admitting: Physician Assistant

## 2022-11-07 ENCOUNTER — Other Ambulatory Visit: Payer: Self-pay

## 2022-11-07 ENCOUNTER — Encounter: Payer: Self-pay | Admitting: Internal Medicine

## 2022-11-07 ENCOUNTER — Encounter: Payer: Self-pay | Admitting: Physician Assistant

## 2022-11-07 ENCOUNTER — Encounter: Payer: Self-pay | Admitting: Orthopedic Surgery

## 2022-11-07 VITALS — BP 133/84 | HR 90 | Ht 71.0 in | Wt 208.0 lb

## 2022-11-07 DIAGNOSIS — M79645 Pain in left finger(s): Secondary | ICD-10-CM | POA: Insufficient documentation

## 2022-11-07 DIAGNOSIS — S62645A Nondisplaced fracture of proximal phalanx of left ring finger, initial encounter for closed fracture: Secondary | ICD-10-CM
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
## 2022-11-08 ENCOUNTER — Ambulatory Visit: Payer: Medicaid Other | Admitting: Pediatrics

## 2022-11-11 DIAGNOSIS — F4310 Post-traumatic stress disorder, unspecified: Secondary | ICD-10-CM

## 2022-11-11 NOTE — BH Discharge Summary (Signed)
Clinical Management Note     Pt no-showed 6 times over the course of assessment phase. Pt lost of contact right after enrollment. Pt's service will be closed now.     Shawnie Dapper, LMFT

## 2022-11-19 ENCOUNTER — Ambulatory Visit
Admission: RE | Admit: 2022-11-19 | Discharge: 2022-11-19 | Disposition: A | Discharge status: Home / Self Care | Payer: Auto Insurance (includes no fault) | Source: Ambulatory Visit

## 2022-11-19 ENCOUNTER — Encounter: Payer: Self-pay | Admitting: Orthopedic Surgery

## 2022-11-19 ENCOUNTER — Ambulatory Visit: Payer: Auto Insurance (includes no fault) | Admitting: Orthopedic Surgery

## 2022-11-19 ENCOUNTER — Ambulatory Visit: Payer: Medicaid Other | Attending: Physician Assistant

## 2022-11-19 ENCOUNTER — Other Ambulatory Visit: Payer: Self-pay

## 2022-11-19 VITALS — BP 151/85 | HR 93 | Ht 71.0 in | Wt 208.0 lb

## 2022-11-19 DIAGNOSIS — M79645 Pain in left finger(s): Secondary | ICD-10-CM | POA: Insufficient documentation

## 2022-11-19 DIAGNOSIS — S62651D Nondisplaced fracture of medial phalanx of left index finger, subsequent encounter for fracture with routine healing: Secondary | ICD-10-CM

## 2022-11-19 DIAGNOSIS — S62645A Nondisplaced fracture of proximal phalanx of left ring finger, initial encounter for closed fracture: Secondary | ICD-10-CM

## 2022-11-19 DIAGNOSIS — S62611D Displaced fracture of proximal phalanx of left index finger, subsequent encounter for fracture with routine healing: Secondary | ICD-10-CM

## 2022-11-19 MED ORDER — ACETAMINOPHEN 500 MG PO TABS *I*
1000.0000 mg | ORAL_TABLET | Freq: Three times a day (TID) | ORAL | 1 refills | Status: DC | PRN
Start: 2022-11-19 — End: 2024-05-07
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
## 2022-11-27 ENCOUNTER — Ambulatory Visit: Payer: Auto Insurance (includes no fault) | Attending: Physician Assistant

## 2022-11-27 DIAGNOSIS — M79645 Pain in left finger(s): Secondary | ICD-10-CM | POA: Insufficient documentation
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
## 2022-12-05 ENCOUNTER — Ambulatory Visit
Admission: RE | Admit: 2022-12-05 | Discharge: 2022-12-05 | Disposition: A | Discharge status: Home / Self Care | Payer: Auto Insurance (includes no fault) | Source: Ambulatory Visit

## 2022-12-05 ENCOUNTER — Other Ambulatory Visit: Payer: Self-pay

## 2022-12-05 ENCOUNTER — Ambulatory Visit: Payer: Auto Insurance (includes no fault)

## 2022-12-05 ENCOUNTER — Encounter: Payer: Self-pay | Admitting: Orthopedic Surgery

## 2022-12-05 ENCOUNTER — Ambulatory Visit: Payer: Auto Insurance (includes no fault) | Admitting: Orthopedic Surgery

## 2022-12-05 VITALS — BP 134/92 | HR 82 | Ht 71.0 in | Wt 211.0 lb

## 2022-12-05 DIAGNOSIS — M79645 Pain in left finger(s): Secondary | ICD-10-CM

## 2022-12-05 DIAGNOSIS — Z9889 Other specified postprocedural states: Secondary | ICD-10-CM

## 2022-12-05 DIAGNOSIS — S62645A Nondisplaced fracture of proximal phalanx of left ring finger, initial encounter for closed fracture: Secondary | ICD-10-CM

## 2022-12-05 DIAGNOSIS — S62645D Nondisplaced fracture of proximal phalanx of left ring finger, subsequent encounter for fracture with routine healing: Secondary | ICD-10-CM

## 2022-12-05 NOTE — Progress Notes (Signed)
[facility] Orthopedic Hand / Upper Extremity Rehabilitation Treatment Note    Today's Date: 12/05/2022    Name: [name]  DOB: 1985-11-13  Referring Physician: Geralynn Rile, PA  Diagnosis:   1. L RF Proximal Phalanx Fx            Visit #: 4    Subjective:  Pain Assessment: 3 with gripping  Patient reports increased ROM in affected digit. Patient reports he has been working on the side at a [employer] but this does not include heavy duty work and that he has been wearing his splint full time when at this job. Patient is happy with the progress he has made thus far and is excited to begin strengthening around the ~7 week mark     Objective:  ROM -  Left, Finger, EXT  , FLEX    Strength - NA.  Function: - Worsened  Education:  Updated HEP, Verbal cues for ther ex    Objective      Left Ring Finger   MP 0/50 (1/8) vs 0/75 (1/16) vs 0/90 (1/24)   PIP 0/40 (1/8) vs 0/80 (1/16) vs 0/90 (1/24)   DIP 0/60 (1/8) vs 0/60 (1/16) vs 0/65 (1/24)       Treatment:  Moist heat    Assessment:   Patient doing well and has increased ROM noted above. Explained to patient that even though importance of not doing any resistive actions with affected hand as he is 4 weeks out and the bone is not strong enough to handle resistance. Patient reports understanding    Patient DIP ROM limited. Addressed and provided new HEP therex.       Plan of Care:  Continue per Plan of care -  As written; Patient would benefit from skilled rehabilitation services to address the above impairments to restore functional capacity.    Thank you for referring this patient to [facility] Orthopaedics - Hand and Upper Extremity Rehabilitation    Orlene Och, OT    Time Calculation Minutes   Time based CPT codes 30 min   Service Based CPT codes    Unbilled time (rest, etc)        Total Treatment Time 30 min     Tendon Glides    Isolated affected digit extension with palm flat x   MP flexion with PIP extended, DIP AROM/PROM x    Buddy tape reinforcement x   Splint adjustment x
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
## 2022-12-10 ENCOUNTER — Ambulatory Visit: Payer: Auto Insurance (includes no fault)

## 2022-12-17 ENCOUNTER — Telehealth: Payer: Self-pay

## 2022-12-17 ENCOUNTER — Encounter: Payer: Self-pay | Admitting: Orthopedic Surgery

## 2022-12-17 NOTE — Telephone Encounter (Signed)
Patient needs a letter, indicating that he was able to work light duty as of 03/21/22; sit down work only.

## 2022-12-19 ENCOUNTER — Ambulatory Visit: Payer: Auto Insurance (includes no fault) | Attending: Orthopedic Surgery

## 2022-12-19 ENCOUNTER — Other Ambulatory Visit: Payer: Self-pay

## 2022-12-19 ENCOUNTER — Ambulatory Visit: Payer: Auto Insurance (includes no fault) | Admitting: Orthopedic Surgery

## 2022-12-19 ENCOUNTER — Ambulatory Visit
Admission: RE | Admit: 2022-12-19 | Discharge: 2022-12-19 | Disposition: A | Discharge status: Home / Self Care | Payer: Auto Insurance (includes no fault) | Source: Ambulatory Visit

## 2022-12-19 VITALS — BP 125/80 | HR 81

## 2022-12-19 DIAGNOSIS — S62645A Nondisplaced fracture of proximal phalanx of left ring finger, initial encounter for closed fracture: Secondary | ICD-10-CM

## 2022-12-19 DIAGNOSIS — M79645 Pain in left finger(s): Secondary | ICD-10-CM | POA: Insufficient documentation

## 2022-12-19 NOTE — Progress Notes (Addendum)
[facility] Orthopedic Hand / Upper Extremity Rehabilitation Treatment Note    Today's Date: 12/19/2022    Name: [name]  DOB: 1985/12/27  Referring Physician: Myrtice Lauth, MD  Diagnosis:   1. L RF Proximal Phalanx Fx            Visit #: 5    Subjective:  Pain Assessment: 1  Patient reports decreased pain and increased ease of composite fist completion. Patient is happy with the progress he has made thus far and is excited to wean back into full ADL engagement since cleared by PA.      Objective:  ROM -  Left, Finger, EXT  , FLEX    Strength - NA.  Function: - Worsened  Education:  Updated HEP, Verbal cues for ther ex    Objective      Left Ring Finger   MP 0/50 (1/8) vs 0/75 (1/16) vs 0/90 (1/24)   PIP 0/40 (1/8) vs 0/80 (1/16) vs 0/90 (1/24)   DIP 0/60 (1/8) vs 0/60 (1/16) vs 0/65 (1/24) vs 0/65 (2/7)     Hand Strength:  Jamar dynamometer (lbs)  Position Right Left   1     2 80 55, 60, 60   3     4     5      Hand Strength:  Jamar Pinch Gauge (lbs)  Pinch Right Left   Lateral     Tip (2 point)     Tip (3 point)        Treatment:  Moist heat    Assessment:   Patient doing well and continues to be able to complete a composite fist. Slightly limited in DIP flexion. Stressed the importance of the new therex which was provided prior session to address this. Patient progressed to light resistive therex as patient is 7 weeks. Patient did very well on these and notes some fatigue/weakness when doing resistive therex compared to trying them with NON-dominant hand       Plan of Care:  Continue per Plan of care -  As written; Patient would benefit from skilled rehabilitation services to address the above impairments to restore functional capacity.    Thank you for referring this patient to [facility] Orthopaedics - Hand and Upper Extremity Rehabilitation    Orlene Och, OT    Time Calculation Minutes   Time based CPT codes 40 min   Service Based CPT codes    Unbilled time (rest, etc)         Total Treatment Time 40 min     Tendon Glides x   Isolated affected digit extension with palm flat x   MP flexion with PIP extended, DIP AROM/PROM x   B foam composite fist x   Y flexbar wring outs and U's x   R gripper x   EDC extension rubber band x
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
## 2022-12-20 ENCOUNTER — Ambulatory Visit: Payer: Medicaid Other | Admitting: Orthopedic Surgery

## 2022-12-24 ENCOUNTER — Ambulatory Visit: Payer: Auto Insurance (includes no fault)

## 2022-12-24 DIAGNOSIS — M79645 Pain in left finger(s): Secondary | ICD-10-CM

## 2022-12-24 NOTE — Progress Notes (Signed)
[facility] Orthopedic Hand / Upper Extremity Rehabilitation Treatment Note    Today's Date: 12/24/2022    Name: [name]  DOB: Nov 16, 1985  Referring Physician: Myrtice Lauth, MD  Diagnosis:   1. L RF Proximal Phalanx Fx            Visit #: 6    Subjective:  Pain Assessment: 1  Patient reports no changes since prior session.      Objective:  ROM -  Left, Finger, EXT  , FLEX    Strength - NA.  Function: - Worsened  Education:  Updated HEP, Verbal cues for ther ex    Objective      Left Ring Finger   MP 0/50 (1/8) vs 0/75 (1/16) vs 0/90 (1/24)   PIP 0/40 (1/8) vs 0/80 (1/16) vs 0/90 (1/24)   DIP 0/60 (1/8) vs 0/60 (1/16) vs 0/65 (1/24) vs 0/65 (2/7)     Hand Strength:  Jamar dynamometer (lbs)  Position Right Left   1     2 80 55, 60, 60   3     4     5      Hand Strength:  Jamar Pinch Gauge (lbs)  Pinch Right Left   Lateral     Tip (2 point)     Tip (3 point)        Treatment:  Moist heat    Assessment:   Patient doing well and reports no tenderness or fatigue following last session when adding new resistive therex. Implemented new resistive therex and slightly increased priors. Patient noted increased fatigue by end of session. Planning for new dyna measurements following session.        Plan of Care:  Continue per Plan of care -  As written; Patient would benefit from skilled rehabilitation services to address the above impairments to restore functional capacity.    Thank you for referring this patient to [facility] Orthopaedics - Hand and Upper Extremity Rehabilitation    Orlene Och, OT    Time Calculation Minutes   Time based CPT codes 40 min   Service Based CPT codes    Unbilled time (rest, etc)        Total Treatment Time 40 min     Tendon Glides x   Isolated affected digit extension with palm flat x   MP flexion with PIP extended, DIP AROM/PROM x   B foam isolated digit flexion (O's) x   G flexbar wring outs and U's x   Black  gripper x   EDC extension rubber band doubled x    500g ball pops x

## 2022-12-25 ENCOUNTER — Encounter: Payer: Self-pay | Admitting: Orthopedic Surgery

## 2022-12-25 NOTE — Telephone Encounter (Signed)
Work note written and sent via mychart

## 2022-12-26 ENCOUNTER — Ambulatory Visit: Payer: Medicaid Other | Admitting: Orthopedic Surgery

## 2022-12-31 ENCOUNTER — Ambulatory Visit: Payer: Auto Insurance (includes no fault)

## 2022-12-31 NOTE — Progress Notes (Unsigned)
[facility] Orthopedic Hand / Upper Extremity Rehabilitation Treatment Note    Today's Date: 12/31/2022    Name: Shiro Wobig  DOB: Apr 06, 1986  Referring Physician: Myrtice Lauth, MD  Diagnosis:   No diagnosis found.      Visit #: Visit count could not be calculated. Make sure you are using a visit which is associated with an episode.    Subjective:  Pain Assessment: 1  Patient reports no changes since prior session.      Objective:  ROM -  Left, Finger, EXT  , FLEX    Strength - NA.  Function: - Worsened  Education:  Updated HEP, Verbal cues for ther ex    Objective      Left Ring Finger   MP 0/50 (1/8) vs 0/75 (1/16) vs 0/90 (1/24)   PIP 0/40 (1/8) vs 0/80 (1/16) vs 0/90 (1/24)   DIP 0/60 (1/8) vs 0/60 (1/16) vs 0/65 (1/24) vs 0/65 (2/7)     Hand Strength:  Jamar dynamometer (lbs)  Position Right Left   1     2 80 55, 60, 60   3     4     5      Hand Strength:  Jamar Pinch Gauge (lbs)  Pinch Right Left   Lateral     Tip (2 point)     Tip (3 point)        Treatment:  Moist heat    Assessment:   Patient doing well and reports no tenderness or fatigue following last session when adding new resistive therex. Implemented new resistive therex and slightly increased priors. Patient noted increased fatigue by end of session. Planning for new dyna measurements following session.        Plan of Care:  Continue per Plan of care -  As written; Patient would benefit from skilled rehabilitation services to address the above impairments to restore functional capacity.    Thank you for referring this patient to [facility] Orthopaedics - Hand and Upper Extremity Rehabilitation    Channing Mutters, OT    Time Calculation Minutes   Time based CPT codes 40 min   Service Based CPT codes    Unbilled time (rest, etc)        Total Treatment Time 40 min     Tendon Glides x   Isolated affected digit extension with palm flat x   MP flexion with PIP extended, DIP AROM/PROM x   B foam isolated digit flexion (O's) x   G  flexbar wring outs and U's x   Black  gripper x   EDC extension rubber band doubled x   500g ball pops x

## 2023-01-08 ENCOUNTER — Ambulatory Visit: Payer: Auto Insurance (includes no fault)

## 2023-01-08 DIAGNOSIS — M79645 Pain in left finger(s): Secondary | ICD-10-CM
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
## 2023-01-30 ENCOUNTER — Ambulatory Visit: Payer: Medicaid Other | Attending: Sleep Medicine | Admitting: Neurology

## 2023-01-30 ENCOUNTER — Telehealth: Payer: Self-pay

## 2023-01-30 ENCOUNTER — Other Ambulatory Visit: Payer: Self-pay

## 2023-01-30 ENCOUNTER — Ambulatory Visit: Payer: Medicaid Other | Admitting: Neurology

## 2023-01-30 VITALS — BP 118/64 | HR 81 | Temp 97.7°F | Ht 71.0 in | Wt 212.3 lb

## 2023-01-30 DIAGNOSIS — R0683 Snoring: Secondary | ICD-10-CM

## 2023-01-30 DIAGNOSIS — G4733 Obstructive sleep apnea (adult) (pediatric): Secondary | ICD-10-CM

## 2023-01-30 NOTE — Telephone Encounter (Signed)
Called [name] to reschedule his new patient visit.

## 2023-01-30 NOTE — Progress Notes (Signed)
[facility]          HOME SLEEP STUDY POST TEST QUESTIONNAIRE      Patient Name:        [name]    MRN #  [id number]      What was your approximate bedtime? _________________________       How long did it take you to fall asleep? _________________________      How many times did you wake up? _________________________      Approximately what time? / How long were you awake?    _____________/_______________    _____________/_______________    _____________/_______________    _____________/_______________    At what time did you get up for the day? __________________________    Any problems or comments?  __________________________               Patient signature:______________________   Date:_______________            Please Complete this page  &  Return with Sleep Study Case.Marland KitchenMarland KitchenThank You!                                                                                                   [facility]  HOME SLEEP TESTING EQUIPMENT LOAN AGREEMENT  Please help [place] care for your family, friends, and neighbors by returning the equipment on-time.  If you don't return the equipment by the date stated below, you may delay another patient's diagnosis and treatment.      PATIENT NAME:  [name]    MRN#:   [id number]  PHONE NUMBER: [phone]  I have received the Home Sleep Monitor ("Equipment") identified below from the [facility] Sleep [facility]") and have been instructed on how to use it. I agree that if I have questions on use of the equipment to call [facility] at [phone]; iN AN EMERGENCY I WILL CALL 911.   Serial Number:    [id number]  I agree to return the equipment to [facility], in the case provided, by________________________, between 6:30 AM and 4:30 PM, TO the SLEEP CENTER'S FRONT DESK, via curbside service directly to a staff member or in hsat drop-box at [address] [place], [place] [address].  I agree to return the Equipment promptly and  in the same condition that I borrowed it by the date stated above. I understand the Equipment is and at all times shall remain [place]'s property. I acknowledge the cost to replace the Equipment is at least $3,000. I agree to be responsible for the cost of repairing or replacing the Equipment, if it is damaged, lost, confiscated, or stolen, until it is returned to [facility].    I understand and agree that if I fail to return the Equipment, including because it is lost, stolen, or confiscated, by the date stated above, or if the Equipment is damaged when returned, that [facility] may: i) bill me for the cost of the equipment, including by adding the cost to any outstanding balance; ii) sue me to recover the cost of replacing or  repairing the Equipment; and iii) take steps to terminate my physician-patient relationship with the Sleep Center. I agree to pay the [facility] costs, including reasonable attorneys' fees, for collecting the cost of repairing or replacing the Equipment.   IF YOU CANNOT RETURN THE EQUIPMENT BY THE DATE STATED ABOVE OR IF THE EQUPMENT IS lost, stolen or damaged, you must immediately contact us AT [phone].                                 [place] Sleep Study Guide      For Home Sleep Study set-up instructions watch the following video: Click the link sent via a MyChart message [email or web address]  or search the Internet for [employer] video.  Refer to the provided instructions as needed.    To initiate recording simply place the blue belt around your chest making sure both ends of the belt are fully connected to the device.  This belt action automatically starts and stops the apnea screening device. The On/Off quadrant light spins for 30 seconds.  The 3 sensor lights flash yellow until they recognize a good signal changing to solid green.  Once all 3 turn green the On/Off light also turns solid green for 1 minute, then they'll shut off for the night  one by one.  The device does not need to be on your skin, can go over top of nightwear.    We suggest that you adequately tape the cannula and oximeter, and sleep in whatever position you prefer.  If you get up during the night you can leave the equipment on, feel free to remove the finger probe briefly while up, just put back on a finger when returning to bed.      In the morning, unplug one side of the belt to stop the study and discard nasal cannula before placing equipment back in the box.  All other equipment can remain attached to the unit.      You can return your testing equipment in our after-hours drop-box available 24/7.  If you would like an immediate download to confirm we captured adequate quality data please return the device during the preferred hours of 6:30 AM & 4:30 PM    To report a curbside device drop-off please park in one of our designated HST parking spaces and call [phone] upon arrival.  We can retrieve the device from your vehicle and check to make sure we have enough sleep data sample for MD review within a few minutes.  Staff is available for drop-off and immediate downloading of the device until 4:30 pm Monday through Friday.    If unable to return the device timely please call us @ [phone].    Results will be communicated via MyChart or during your scheduled follow up visit.      If you or someone in your home develop COVID-19 symptoms within 24 hours of the study please call the lab at [phone].                                                    [facility] SLEEP TESTING EQUIPMENT LOAN AGREEMENT  Please help [place] care for your family, friends, and neighbors by returning the equipment  on-time.  If you don't return the equipment by the date stated below, you may delay another patient's diagnosis and treatment.      PATIENT NAME:  [name]     MRN#:   [id number]   PHONE NUMBER: [phone]    I have received the Home Sleep Monitor ("Equipment")  identified below from the [place] [facility]") and have been instructed on how to use it. I agree that if I have questions on use of the equipment to call [facility] at [phone]; iN AN EMERGENCY I WILL CALL 911.   Serial Number:   [id number]  I agree to return the equipment to [facility], in the case provided, by________________________, between 6:30 AM and 4:30 PM, TO the SLEEP CENTER'S FRONT DESK, via curbside service directly to a staff member or in hsat drop-box at [address] [place], [place] [address].  I agree to return the Equipment promptly and in the same condition that I borrowed it by the date stated above. I understand the Equipment is and at all times shall remain [facility]'s property. I acknowledge the cost to replace the Equipment is at least $3,000. I agree to be responsible for the cost of repairing or replacing the Equipment, if it is damaged, lost, confiscated, or stolen, until it is returned to [facility].    I understand and agree that if I fail to return the Equipment, including because it is lost, stolen, or confiscated, by the date stated above, or if the Equipment is damaged when returned, that [facility] may: i) bill me for the cost of the equipment, including by adding the cost to any outstanding balance; ii) sue me to recover the cost of replacing or repairing the Equipment; and iii) take steps to terminate my physician-patient relationship with the Sleep Center. I agree to pay the [facility] costs, including reasonable attorneys' fees, for collecting the cost of repairing or replacing the Equipment.   IF YOU CANNOT RETURN THE EQUIPMENT BY THE DATE STATED ABOVE OR IF THE EQUPMENT IS lost, stolen or damaged, you must immediately contact us AT [phone].    Signed:  ____________________________  Date: _________      Name: [name]

## 2023-01-30 NOTE — H&P (Addendum)
[facility] - New Patient Visit       Referred to the [facility] by Dr. Melina Copa for evaluation.     HISTORY   Subjective      Dear Dr. Melina Copa,    We evaluated [name] at the [facility] on 01/30/2023.     As you know, he is a 37 y.o. year old male with asthma, prediabetes, and GERD who presents to our clinic for snoring and witnessed pauses in breathing.    Reason for referral per chart review:  -Snoring and witnessed pauses in breathing with concern of OSA per PCP        Sleep History:  His significant other told him that he snores very loudly and has loud pauses in breathing.  He is concerned about pauses in breathing as his father vascular disease a year ago at a young age.  Reports worsened snoring worsened pauses in breathing when he drinks alcohol on the weekends.    His sleep hours are typically from 0200 to 0800.  He tells me he has a hard time falling asleep but states that he watches TV or plays on his phone until he is extremely tired.  He has not tried turning off the TV or putting his phone down earlier in the evening to see if he would fall asleep faster.  He typically falls sleep within 30-60 minutes.  He reports two awakenings during the night to use the bathroom. He will then fall asleep within a few minutes. He awakens feeling sometimes rested and other times tired in the mornings. Despite adequate/increased sleep duration, he will continue to feel tired upon awakening. He reports his sleep is at times non-restorative.  [name] does not nap during the daytime. He typically sleeps on his side.     He endorses loud snoring, witnessed pauses in breathing, gasping arousals, and daytime sleepiness.    He denies symptoms consistent with narcolepsy (no hypnagogic hallucinations, cataplexy, or sleep paralysis), restless leg syndrome (no urge to move or unpleasant sensations that begin or worsen during periods of rest or inactivity that are relieved by  movement that also worsen in the evening or night), nor frequent nocturnal limb movements that awaken him from sleep.    He does not experience difficulties with memory and concentration. He would not describe himself as irritable during the daytime. He does experience symptoms of anxiety or depression.      Labs Reviewed:  CO2: 24  RBC: 4.6      Review Flowsheet  More data may exist         01/30/2023   Epworth Sleep Scale   Sitting and reading 1   Watching TV 2   Sitting inactive in a public place (e.g a theater or a meeting) 1   As a passenger in a car for an hour without a break 0   Lying down to rest in the afternoon when circumstances permit 2   Sitting and talking to someone 0   Sitting quietly after a lunch without alcohol 0   In a car, while stopped for a few minutes in traffic 0   EPWORTH TOTAL  6       He denies sleepiness while driving.     REVIEW OF SYSTEMS   Negative except for HPI above.    FAMILY HISTORY    There is no family history of sleep disorders.     SOCIAL HISTORY   marital  status: GF  occupation: Academic librarian     caffeine intake: soda  smoking status: 3 cigarettes a day   alcohol: weekends  recreational drugs: none         Objective       VITAL SIGNS   BP 118/64 (BP Location: Right arm, Patient Position: Sitting, Cuff Size: large adult)   Pulse 81   Temp 36.5 C (97.7 F)   Ht 1.803 m (5\' 11" ) Comment: per pt  Wt 96.3 kg (212 lb 4.8 oz)   SpO2 96%   BMI 29.61 kg/m     Neck Circumference: 17 inches    PHYSICAL EXAMINATION     General: overweight, comfortable appearing, and in no apparent distress  Sclera: anicteric and conjunctiva pink   Nasal passages: patent  Oropharynx: 3+/4+ (L/R) tonsillar tissue, low set soft palate, high arched hard palate, redundant soft palate, elongated uvula,  macroglossia with tongue scalloping, and modified Mallampati class IV airway  Maxilla and mandible: normal bite  Psychiatric: affect appropriate          ASSESSMENT     Obstructive sleep apnea  (OSA):    Presentation is concerning for OSA given history of snoring, witnessed pauses in breathing, gasping arousals as well as examination showing severely crowded airway with 3+/4+ tonsils.  Given the above, we will proceed with home sleep testing for evaluation of obstructive sleep apnea.    He also reports a hard time falling asleep but this may be related to inadequate sleep hygiene.  Discussed the importance of sleep hygiene including turning off his phone, TV, or other electronics 1 hour prior to desired bedtime.  He was asked to provide me with an update in approximately 1 month.  If he continues to have a hard time falling asleep despite measures above, will reevaluate evaluate for either insomnia versus delayed sleep phase.    We encouraged efforts at weight loss. We also encouraged his practice of never driving while sleepy. Follow up results of HST via MyChart.    Thank you for allowing [place] to participate in the care of your patient.  Please do not hesitate to contact our office with any questions.    Patient was seen and discussed with sleep attending, Dr. Lacie Draft.    Irena Cords, M.D.   [facility] Fellow  01/30/2023 at 11:31 AM        I saw and evaluated the patient. I have reviewed and edited the resident's/fellow's note and confirm the findings and plan of care as documented above.    Fredrik Cove, MD

## 2023-01-30 NOTE — Patient Instructions (Addendum)
Please turn off the phone and TV at 11 PM or an hour prior to bedtime     We will also obtain a home sleep test

## 2023-01-30 NOTE — Progress Notes (Signed)
ARRIVED FOR HSAT PICK UP    Sleep MD:  Atilano Median    Interpreter Used: No    Estimated return date:  01/31/23     [name] on 01/30/2023 at 12:01 PM

## 2023-02-01 NOTE — Progress Notes (Signed)
RETURNED HSAT DEVICE    Data quality: Adequate Data - Analysis in progress    2nd Night: No - One Night Only    Patient Questionnaire Info: Completed    Follow up date: Visit date not found / MYCHART F/U     Burnett Corrente on 02/01/2023 at 11:13 AM      [facility]         [facility] HSAT PATIENT QUESTIONNAIRE    Patient Sleep Diary Entries    1)   What was your approximate bedtime? 12:30 a.m.    2)   How long did it take you to fall asleep? 15 minutes  3)  How many times do you remember waking up? 2     A) Approx. Time ? / How long were you awake?      2: 39 /  3 minutes    6:30/  10 minutes        4) What time did you finally wake up for the day?  8:30 a.m.    5)  Any problems or Comments?          [name]

## 2023-02-06 ENCOUNTER — Ambulatory Visit: Payer: Auto Insurance (includes no fault) | Admitting: Orthopedic Surgery

## 2023-02-07 NOTE — Procedures (Addendum)
This is an unattended home sleep apnea test.    This study utilized a 6-channel Type III home sleep respiratory recorder.  The following parameters were recorded: snoring (high frequency vibrations in airflow), oronasal pressure airflow, RIP chest effort, SpO2, pulse rate, and body position.  There was no EEG monitoring during this study, as such we were not able to discern between periods of sleep and wake. As such, HSATs tend to underestimate the actual severity of sleep disordered breathing. The quality of this recording was good.    This study is suggestive of a diagnosis of mild obstructive sleep apnea. His unattended apnea hypopnea index (uAHI) was 11.8 respiratory events per hour of recording time. His oxygen saturation nadir was 87%.     Significant cyclic variation of pulse rate was noted in association with the respiratory events.       Irena Cords M.D.   Sleep Fellow    Electronically signed on 02/13/2023 by Myrtis Hopping, MD

## 2023-02-11 ENCOUNTER — Other Ambulatory Visit: Payer: Self-pay

## 2023-02-11 ENCOUNTER — Ambulatory Visit: Payer: Auto Insurance (includes no fault) | Admitting: Orthopedic Surgery

## 2023-02-11 ENCOUNTER — Ambulatory Visit
Admission: RE | Admit: 2023-02-11 | Discharge: 2023-02-11 | Disposition: A | Discharge status: Home / Self Care | Payer: Auto Insurance (includes no fault) | Source: Ambulatory Visit

## 2023-02-11 VITALS — BP 118/64 | HR 66 | Ht 71.0 in | Wt 212.0 lb

## 2023-02-11 DIAGNOSIS — S62645A Nondisplaced fracture of proximal phalanx of left ring finger, initial encounter for closed fracture: Secondary | ICD-10-CM
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
## 2023-02-13 ENCOUNTER — Encounter: Payer: Self-pay | Admitting: Neurology

## 2023-02-15 MED ORDER — CPAP MACHINE *A*
0 refills | Status: AC
Start: 2023-02-15 — End: ?

## 2023-02-19 ENCOUNTER — Ambulatory Visit
Admission: RE | Admit: 2023-02-19 | Discharge: 2023-02-19 | Disposition: A | Discharge status: Home / Self Care | Payer: Medicaid Other | Source: Ambulatory Visit

## 2023-02-19 ENCOUNTER — Ambulatory Visit: Payer: Medicaid Other | Admitting: Orthopedic Surgery

## 2023-02-19 ENCOUNTER — Encounter: Payer: Self-pay | Admitting: Gastroenterology

## 2023-02-19 ENCOUNTER — Other Ambulatory Visit: Payer: Self-pay

## 2023-02-19 ENCOUNTER — Encounter: Payer: Self-pay | Admitting: Orthopedic Surgery

## 2023-02-19 VITALS — Ht 71.0 in | Wt 214.0 lb

## 2023-02-19 DIAGNOSIS — S82291D Other fracture of shaft of right tibia, subsequent encounter for closed fracture with routine healing: Secondary | ICD-10-CM
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
## 2023-02-21 ENCOUNTER — Ambulatory Visit: Payer: Medicaid Other | Admitting: Sleep Medicine

## 2023-02-22 ENCOUNTER — Ambulatory Visit: Payer: Medicaid Other | Admitting: Orthopedic Surgery

## 2023-02-25 ENCOUNTER — Encounter: Payer: Self-pay | Admitting: Orthopedic Surgery

## 2023-03-04 NOTE — Anesthesia Preprocedure Evaluation (Addendum)
Anesthesia Pre-operative History and Physical for [name]    Highlighted Issues for this Procedure:  37 y.o. male with Other closed fracture of shaft of right tibia with routine healing, subsequent encounter [S82.291D] presenting for Procedure(s) (LRB):  RIGHT REMOVAL, HARDWARE, TIBIA (Right) by Surgeon(s):  Jerolyn Center, MD scheduled for 75 minutes.  BMI Readings from Last 1 Encounters:  02/19/23 : 29.85 kg/m          .  Marland Kitchen  Anesthesia Evaluation Information Source: records, patient     ANESTHESIA HISTORY  Pertinent(-):  No History of anesthetic complications    GENERAL    + Obesity  Pertinent (-):  No history of anesthetic complications     PULMONARY    + Smoker          tobacco currently, tobacco advised to quit    + Asthma    + Snoring    + Sleep apnea          CPAP  Comment: Cold 3 weeks ago  Resolving no fever no chills.  Used inhaler last night    CARDIOVASCULAR  Pertinent(-):  No hypertension    GI/HEPATIC/RENAL   NPO: > 8hrs ago (solids)      + GERD          well controlled  Comment: Water 10 AM   NEURO/PSYCH/ORTHO  Pertinent(-):  No seizures or cerebrovascular event    ENDO/OTHER  Pertinent(-):  No diabetes mellitus, thyroid disease           Physical Exam    Airway            Mallampati: II  Dental        Cardiovascular  Normal Exam           Rhythm: regular      Neurologic    Normal Exam     Pulmonary   Normal Exam           __________________________Productive green cough. Case rescheduled.______________________________________________  Ermalinda Memos Plan  Anesthesia Consent Not Performed
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
## 2023-03-06 ENCOUNTER — Other Ambulatory Visit: Payer: Self-pay

## 2023-03-06 ENCOUNTER — Ambulatory Visit: Payer: Medicaid Other

## 2023-03-07 ENCOUNTER — Encounter: Payer: Self-pay | Admitting: Anesthesiology

## 2023-03-07 ENCOUNTER — Ambulatory Visit
Admission: RE | Admit: 2023-03-07 | Discharge: 2023-03-07 | Disposition: A | Discharge status: Home / Self Care | Payer: Auto Insurance (includes no fault) | Source: Ambulatory Visit | Attending: Orthopedic Surgery | Admitting: Orthopedic Surgery

## 2023-03-07 ENCOUNTER — Encounter
Admission: RE | Disposition: A | Discharge status: Home / Self Care | Payer: Self-pay | Source: Ambulatory Visit | Attending: Orthopedic Surgery

## 2023-03-07 ENCOUNTER — Other Ambulatory Visit: Payer: Self-pay

## 2023-03-07 ENCOUNTER — Encounter: Payer: Self-pay | Admitting: Internal Medicine

## 2023-03-07 ENCOUNTER — Encounter: Payer: Self-pay | Admitting: Orthopedic Surgery

## 2023-03-07 DIAGNOSIS — X58XXXD Exposure to other specified factors, subsequent encounter: Secondary | ICD-10-CM | POA: Insufficient documentation

## 2023-03-07 DIAGNOSIS — Z539 Procedure and treatment not carried out, unspecified reason: Secondary | ICD-10-CM | POA: Insufficient documentation

## 2023-03-07 DIAGNOSIS — S82291D Other fracture of shaft of right tibia, subsequent encounter for closed fracture with routine healing: Secondary | ICD-10-CM | POA: Insufficient documentation

## 2023-03-07 SURGERY — REMOVAL, HARDWARE, FOOT
Anesthesia: General | Site: Leg Lower | Laterality: Right

## 2023-03-07 MED ORDER — MIDAZOLAM HCL 1 MG/ML IJ SOLN *I* WRAPPED
INTRAMUSCULAR | Status: AC
Start: 2023-03-07 — End: 2023-03-07
  Filled 2023-03-07: qty 2

## 2023-03-07 MED ORDER — CEFAZOLIN 1000 MG IN STERILE WATER 10ML SYRINGE *I*
2000.0000 mg | PREFILLED_SYRINGE | Freq: Once | INTRAVENOUS | Status: DC
Start: 2023-03-07 — End: 2023-03-08

## 2023-03-07 MED ORDER — LACTATED RINGERS IV SOLN *I*
20.0000 mL/h | INTRAVENOUS | Status: DC
Start: 2023-03-07 — End: 2023-03-08
  Administered 2023-03-07: 20 mL/h via INTRAVENOUS

## 2023-03-07 MED ORDER — CEFAZOLIN 1000 MG IN STERILE WATER 10ML SYRINGE *I*
PREFILLED_SYRINGE | INTRAVENOUS | Status: AC
Start: 2023-03-07 — End: 2023-03-07
  Filled 2023-03-07: qty 30

## 2023-03-07 MED ORDER — ACETAMINOPHEN 325 MG PO TABS *I*
ORAL_TABLET | ORAL | Status: AC
Start: 2023-03-07 — End: 2023-03-07
  Filled 2023-03-07: qty 3

## 2023-03-07 MED ORDER — ACETAMINOPHEN 325 MG PO TABS *I*
975.0000 mg | ORAL_TABLET | Freq: Once | ORAL | Status: AC
Start: 2023-03-07 — End: 2023-03-07
  Administered 2023-03-07: 975 mg via ORAL

## 2023-03-07 MED ORDER — HYDROMORPHONE HCL PF 1 MG/ML IJ SOLN *WRAPPED*
INTRAMUSCULAR | Status: AC
Start: 2023-03-07 — End: 2023-03-07
  Filled 2023-03-07: qty 1

## 2023-03-07 MED ORDER — ACETAMINOPHEN 160 MG/5 ML WRAPPED *I*
975.0000 mg | Freq: Once | Status: AC
Start: 2023-03-07 — End: 2023-03-07

## 2023-03-07 MED ORDER — LIDOCAINE HCL (PF) 1 % IJ SOLN *I*
0.1000 mL | Freq: Once | INTRAMUSCULAR | Status: AC | PRN
Start: 2023-03-07 — End: 2023-03-07

## 2023-03-07 SURGICAL SUPPLY — 27 items
APPLICATOR CHLORAPREP STER  ORANGE 26ML (Supply) IMPLANT
BANDAGE COMPRESSION W4INXL5.5YD ELASTIC LF PREMIUM DOUBLE CLOSURE NOVAPLUS (Supply) IMPLANT
BANDAGE ESMARK 6IN X 3YD LF STER (Dressing) IMPLANT
BLADE SURG CARBON STEEL #15 STER (Supply)
BLADE SURG CARBON STEEL #15 STER REUSE HNDL (Supply) IMPLANT
COVER LIGHT HANDLE SURG FLEX (Supply) IMPLANT
CUFF TOURNIQUET SPSB PLC 34IN STER DISP (Supply) IMPLANT
DRAPE OEC MINI C-ARM (Drape) IMPLANT
DRAPE U-SHEET SPLIT PLASTIC STERIL (Drape) IMPLANT
DRESSING FLUFF SUPER SP 6 X 6.75IN STER (Dressing) IMPLANT
DRESSING XEROFORM STR 1 IN X8 (Dressing) IMPLANT
GLOVE BIOGEL PI MICRO IND UNDER SZ 7.5 LF (Glove) IMPLANT
GLOVE BIOGEL PI MICRO IND UNDER SZ 8.0 LF (Glove) IMPLANT
GLOVE SURG BIOGEL PI ULTRATOUCH SZ 8.0 (Glove) IMPLANT
GOWN SIRIUS RAGLAN NONREINFORCED XL (Gown) IMPLANT
NEEDLE HYPO SAFETYGLIDE 22G X 1.5IN GREEN (Needle) IMPLANT
PACK CUSTOM MINOR FOOT ANKLE (Pack) IMPLANT
PADDING WEBRIL 4IN LF STER (Dressing) IMPLANT
SLEEVE COMP KNEE HI MED (Supply) IMPLANT
SOL SOD CHL IRRIG 500ML BTL (Solution) IMPLANT
STOCKINETTE IMPERVIOUS 12X48IN (Drape) IMPLANT
SUTR ETHILON MONO 3-0 PS-2 BLACK (Suture) IMPLANT
SUTR VICRYL ANTIB 1 CT-1 POP 18 VIOLET (Suture) IMPLANT
SUTR VICRYL ANTIB BRD 2-0 CP-2 18 UNDYED (Suture) IMPLANT
SYRINGE LUERLOCK 30ML INDIVIDUAL WRAP (Syringe) IMPLANT
TAPE AC ELAST ADH 3IN X 5YD LF (Supply) IMPLANT
TRAY PITCHER 1200ML W/HANDLE CSR WRAP (Supply) IMPLANT

## 2023-03-07 NOTE — DOS Update (DOS) (Signed)
Day of Service   Surgeon/Proceduralist Documentation   for   [name]'s  Procedure(s) (LRB):  RIGHT REMOVAL, HARDWARE, TIBIA (Right)    Documentation related to        History of Present Illness/Indication for Procedure        Review of Systems Specific to Indicated Procedure/Operative Site        Exam Specific to Indicated Procedure/Operative Site        Planned Procedure  can be found in document listed below or in Procedure Pass Linked Documents report.     Progress Notes by Jerolyn Center, MD on 02/19/2023  8:50 AM    By signing this note, I attest that the information documented above is correct, reflecting the current state of the patient today, and that it is appropriate to proceed with the planned surgery/procedure.

## 2023-03-07 NOTE — Invasive Procedure Plan of Care (Signed)
CONSENT FOR MEDICAL  OR SURGICAL PROCEDURE                            Patient Name: [name]  [facility] [address]                                                              DOB: Feb 08, 1986         Please read this form or have someone read it to you.   It's important to understand all parts of this form. If something isn't clear, ask [place] to explain.   When you sign it, that means you understand the form and give [place] permission to do this surgery or procedure.     I agree for Jerolyn Center, MD , and Medical student, resident, fellow along with any assistants* they may choose, to treat the following condition(s): Symptomatic screws right tibia   By doing this surgery or procedure on me: Take out screws right tibia   This is also known as: Removal right tibial hardware   Laterality: Right     *if you'd like a list of the assistants, please ask. We can give that to you.    1. The care provider has explained my condition to me. They have told me how the procedure can help me. They have told me about other ways of treating my condition. I understand the care provider cannot guarantee the result of the procedure. If I don't have this procedure, my other choices are: No surgery    2. The care provider has told me the risks (problems that can happen) of the procedure. I understand there may be unwanted results. The risks that are related to this procedure include:  bleeding, infection, damage to soft tissue including the vessels, nerves, pain, scarring, stiffness, superficial and deep wound healing problems which could require further surgery, blood clots, as well as the risks of anesthesia which include but are not limited to stroke, heart attack and death.       3. I understand that during the procedure, my care provider may find a condition that we didn't know about before the treatment started. Therefore, I agree that my care provider can perform any other treatment which they think is necessary and available.    4. I  give permission to the hospital and/or its departments to examine and keep tissue, blood, body parts, fluids or materials removed from my body during the procedure(s) to aid in diagnosis and treatment, after which they may be used for scientific research or teaching by appropriate persons. If these materials are used for science or teaching, my identity will be protected. I will no longer own or have any rights to these materials regardless of how they may be used.    5. My care provider might want a representative from a medical device company to be there during my procedure. I understand that person works for:  [employer], [employer], [employer], [employer]        The ways they might help my care provider during my procedure include:        Helping the operating room staff prepare the special device my care provider wants to use and Providing information and support to  operating room staff regarding the device    6. Here are my decisions about receiving blood, blood products, or tissues. I understand my decisions cover the time before, during and after my procedure, my treatment, and my time in the hospital. After my procedure, if my condition changes a lot, my care provider will talk with me again about receiving blood or blood products. At that time, my care provider might need me to review and sign another consent form, about getting or refusing blood.    I understand that the blood is from the community blood supply. Volunteers donated the blood, the volunteers were screened for health problems. The blood was examined with very sensitive and accurate tests to look for hepatitis, HIV/AIDS, and other diseases. Before I receive blood, it is tested again to make sure it is the correct type.    My chances of getting a sickness from blood products are small. But no transfusion is 100% safe. I understand that my care provider feels the good I will receive from the blood is greater than the chances of something going wrong. My  care provider has answered my questions about blood products.      My decision  about blood or  blood products   Yes, I agree to receive blood or blood products if my care provider thinks they're needed.        My decision   about tissue  Implants     Yes, I agree to receive tissue implants if my care provider thinks they're needed.          I understand this  form.    My care provider  or his/her  assistants have  explained:   What I am having done and why I need it.  What other choices I can make instead of having this done.  The benefits and possible risks (problems) to me of having this done.  The benefits and possible risks (problems) to me of receiving transplants, blood, or blood products.  There is no guarantee of the results.  The care provider may not stay with me the entire time that I am in the operating or procedure room.  My provider has explained how this may affect my procedure. My provider has answered my  questions about this.         I give my  permission for  this surgery or  procedure.            _______________________________________________                                     My signature  (or parent or other person authorized to sign for you, if you are unable to sign for  yourself or if you are under 9 years old)        ______           Date        _____        Time   Electronic Signatures will display at the bottom of the consent form.    Care provider's statement: I have discussed the planned procedure, including the possibility for transfusion of blood  products or receipt of tissue as necessary; expected benefits; the possible complications and risks; and possible alternatives  and their benefits and risks with the patients or the patient's surrogate. In my opinion, the patient or the patient's surrogate  understands the proposed procedure, its risks, benefits and alternatives.              Electronically signed by: Jerolyn Center, MD                                                 03/07/2023         Date        12:37 PM        Time   Your doctor or someone your doctor has appointed has told you that you may need blood or a blood product transfusion, which has been collected from volunteers, as part of your treatment as a patient.    The reasons you might need blood or blood products include, but are not limited to:   Significant loss of your own blood  Your body may not be getting enough oxygen to its tissues   Treatment of bleeding disorders caused by low platelet counts or platelets that do not work right (platelets are part of a cell that helps to form clots and keeps you from bleeding too much).  You may not have enough of other substances that help your blood clot or stop you from bleeding more  The risks of getting a transfusion of blood or blood products include, but are not limited to:   Damage to the lungs  Difficulty breathing due to fluid in the lungs  The product may contain bacteria or rarely a virus (which includes HIV and Hepatitis)  Blood from the community blood supply has been collected from volunteer donors who have been screened for health risk. The blood has been tested for major blood transmitted disease, but no transfusion is 100% safe. The blood is tested with very sensitive and accurate tests to screen for hepatitis, AIDS, and other disease, which makes the risks very small.  You may have side effects from the transfusion (rash, fever, chills) or an allergic reaction  The transfusion increases your risks of getting infection or cancer coming back  The transfusion can increase the time you have to stay in the hospital  The transfusion can potentially cause death if the wrong blood is given or your body rejects the blood  Before blood is transfused, it is tested again to make sure it is the correct type  There are other options than getting blood or blood products from other people and they include:   Drugs which can decrease bleeding  Drugs which can cause your body to  make more blood (used in elective procedures with advance notice)  Autologous (your own blood) donation (needs pre-arrangement)  No transfusion  If you exercise your right to refuse to be transfused with blood or blood products; these things listed below, among others, could happen to you:  Your body may not get enough oxygen and suffer damage  You may have a higher chance of bleeding  You may limit other options for your condition  You may die from losing too much blood

## 2023-03-11 ENCOUNTER — Emergency Department: Payer: Self-pay

## 2023-03-11 ENCOUNTER — Emergency Department
Admission: EM | Admit: 2023-03-11 | Discharge: 2023-03-12 | Payer: Self-pay | Source: Ambulatory Visit | Attending: Emergency Medicine | Admitting: Emergency Medicine

## 2023-03-11 ENCOUNTER — Other Ambulatory Visit: Payer: Self-pay

## 2023-03-11 DIAGNOSIS — Y9389 Activity, other specified: Secondary | ICD-10-CM | POA: Insufficient documentation

## 2023-03-11 DIAGNOSIS — M1711 Unilateral primary osteoarthritis, right knee: Secondary | ICD-10-CM | POA: Insufficient documentation

## 2023-03-11 DIAGNOSIS — W2210XA Striking against or struck by unspecified automobile airbag, initial encounter: Secondary | ICD-10-CM | POA: Insufficient documentation

## 2023-03-11 DIAGNOSIS — R1084 Generalized abdominal pain: Secondary | ICD-10-CM | POA: Insufficient documentation

## 2023-03-11 DIAGNOSIS — M25552 Pain in left hip: Secondary | ICD-10-CM | POA: Insufficient documentation

## 2023-03-11 DIAGNOSIS — R519 Headache, unspecified: Secondary | ICD-10-CM | POA: Insufficient documentation

## 2023-03-11 DIAGNOSIS — Y998 Other external cause status: Secondary | ICD-10-CM | POA: Insufficient documentation

## 2023-03-11 DIAGNOSIS — S0990XA Unspecified injury of head, initial encounter: Secondary | ICD-10-CM

## 2023-03-11 DIAGNOSIS — S299XXA Unspecified injury of thorax, initial encounter: Secondary | ICD-10-CM

## 2023-03-11 DIAGNOSIS — S3993XA Unspecified injury of pelvis, initial encounter: Secondary | ICD-10-CM

## 2023-03-11 DIAGNOSIS — M79642 Pain in left hand: Secondary | ICD-10-CM

## 2023-03-11 DIAGNOSIS — Z87828 Personal history of other (healed) physical injury and trauma: Secondary | ICD-10-CM | POA: Insufficient documentation

## 2023-03-11 DIAGNOSIS — S3992XA Unspecified injury of lower back, initial encounter: Secondary | ICD-10-CM

## 2023-03-11 DIAGNOSIS — M542 Cervicalgia: Secondary | ICD-10-CM | POA: Insufficient documentation

## 2023-03-11 DIAGNOSIS — R918 Other nonspecific abnormal finding of lung field: Secondary | ICD-10-CM | POA: Insufficient documentation

## 2023-03-11 DIAGNOSIS — J479 Bronchiectasis, uncomplicated: Secondary | ICD-10-CM | POA: Insufficient documentation

## 2023-03-11 DIAGNOSIS — M25551 Pain in right hip: Secondary | ICD-10-CM | POA: Insufficient documentation

## 2023-03-11 DIAGNOSIS — F17210 Nicotine dependence, cigarettes, uncomplicated: Secondary | ICD-10-CM | POA: Insufficient documentation

## 2023-03-11 DIAGNOSIS — M25561 Pain in right knee: Secondary | ICD-10-CM | POA: Insufficient documentation

## 2023-03-11 DIAGNOSIS — S3991XA Unspecified injury of abdomen, initial encounter: Secondary | ICD-10-CM

## 2023-03-11 DIAGNOSIS — S8991XA Unspecified injury of right lower leg, initial encounter: Secondary | ICD-10-CM

## 2023-03-11 DIAGNOSIS — Y92410 Unspecified street and highway as the place of occurrence of the external cause: Secondary | ICD-10-CM | POA: Insufficient documentation

## 2023-03-11 LAB — HOLD BLUE

## 2023-03-11 LAB — HOLD LAVENDER

## 2023-03-11 LAB — BLOOD BANK HOLD PINK

## 2023-03-11 LAB — HOLD GREEN NO GEL

## 2023-03-11 LAB — HOLD SST

## 2023-03-11 MED ORDER — IOHEXOL 350 MG/ML (OMNIPAQUE) IV SOLN 500ML BOTTLE *I*
1.0000 mL | Freq: Once | INTRAVENOUS | Status: AC
Start: 2023-03-11 — End: 2023-03-11
  Administered 2023-03-11: 146 mL via INTRAVENOUS

## 2023-03-11 MED ORDER — ACETAMINOPHEN 500 MG PO TABS *I*
1000.0000 mg | ORAL_TABLET | Freq: Once | ORAL | Status: AC
Start: 2023-03-11 — End: 2023-03-11
  Administered 2023-03-11: 1000 mg via ORAL
  Filled 2023-03-11: qty 2
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
## 2023-03-12 ENCOUNTER — Telehealth: Payer: Self-pay

## 2023-03-12 ENCOUNTER — Emergency Department: Payer: Self-pay

## 2023-03-12 DIAGNOSIS — M25561 Pain in right knee: Secondary | ICD-10-CM

## 2023-03-12 NOTE — Telephone Encounter (Signed)
Received a record release form from [facility].  Faxed consult, study report, and follow up note to [phone] through [employer].
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
## 2023-03-12 NOTE — ED Notes (Signed)
Pt discharged with all of their belongings. Vitals signs are stable. Discharge paperwork reviewed and patient verbalized understanding. Patient has transportation, access to residence, and appropriate clothing on. IV removed. Leaving in police custody.

## 2023-03-12 NOTE — Telephone Encounter (Signed)
DME: [employer]    Set up date:  02/19/23    Entered in [employer]:  yes       Device:  A10    Mask:  p30istd frame    Device Settings  5-18    CFU Required:  no    Current Appt Date:       Notes:

## 2023-03-21 ENCOUNTER — Encounter: Payer: Self-pay | Admitting: Internal Medicine

## 2023-03-21 ENCOUNTER — Ambulatory Visit: Payer: Self-pay | Admitting: Orthopedic Surgery

## 2023-03-21 NOTE — Progress Notes (Deleted)
HPI/PAST MEDICAL/SURGICAL/GYN HISTORY:    No chief complaint on file.    HPI (recent events) and patient concerns:     ***    Medical/Surgical/Family/Social History:  Reviewed in eRecord today during visit with patient.    Health Maintenance:  Health Maintenance: These screening recommendations are based on USPSTF, [employer], and [place] state guidelines   Topic Date Due    Pneumococcal Vaccination (1 of 2 - PCV) Never done    Hepatitis B Vaccine (1 of 3 - 19+ 3-dose series) Never done    COVID-19 Vaccine (3 - 2023-24 season) 07/13/2022    Flu Shot (Season Ended) 07/14/2023    Depression Screen Yearly  03/20/2024    DTaP/Tdap/Td Vaccines (3 - Td or Tdap) 03/11/2032    HIV Screening  Completed    Hepatitis C Screening  Completed    HIB Vaccine  Aged Out    HPV Vaccine  Aged Out    Meningococcal Vaccine  Aged Out    Rotavirus Vaccine  Aged Out     Safety:  --wears seatbelt {M;yes/no/sometimes:15259}  --wears cycling helmet {M;yes/no/sometimes:15259}  --smoke detectors present {YES NO:22742}  --dental care:  {DESC; DONE/DISCUSSED/DECLINED:14570}  --feels safe at home: {yes no ZOX:09604}    Sexual History:   Contraceptive plan: ***    Other:  Health care proxy:  ***    There were no vitals taken for this visit.  BP Readings from Last 3 Encounters:   03/12/23 109/75   03/07/23 120/72   02/11/23 118/64     Exam:  Vitals: Reviewed.  Constitutional: Well-appearing.  Neck: Normal range of motion. No adenopathy.  Cardiovascular: Normal rate and regular rhythm.  Pulmonary/Chest: Effort normal and breath sounds normal.   Abdominal: Soft, non-tender.     Assessment/Plan     37 y.o. male - annual physical.     No diagnosis found.    ***    No follow-ups on file.    Santina Evans, MD MPH

## 2023-04-05 ENCOUNTER — Telehealth: Payer: Self-pay | Admitting: Neurology

## 2023-04-05 NOTE — Telephone Encounter (Signed)
Patient is not required by insurance to have cfu. Ok to keep for September.

## 2023-04-05 NOTE — Telephone Encounter (Signed)
Copied from CRM [id number]. Topic: Appointments - Schedule Appointment  >> Apr 05, 2023  1:54 PM Jacqulynn Cadet wrote:  Patient was set up on 4/9, compliance appointment is on 9/25, currently in [facility], will be released in August    .

## 2023-07-31 ENCOUNTER — Ambulatory Visit: Payer: Medicaid Other | Admitting: Internal Medicine

## 2023-07-31 ENCOUNTER — Other Ambulatory Visit
Admission: RE | Admit: 2023-07-31 | Discharge: 2023-07-31 | Disposition: A | Discharge status: Home / Self Care | Payer: MEDICAID | Source: Ambulatory Visit | Attending: Internal Medicine | Admitting: Internal Medicine

## 2023-07-31 ENCOUNTER — Other Ambulatory Visit: Payer: Self-pay

## 2023-07-31 ENCOUNTER — Other Ambulatory Visit: Payer: Self-pay | Admitting: Internal Medicine

## 2023-07-31 ENCOUNTER — Ambulatory Visit: Payer: Self-pay | Admitting: Neurology

## 2023-07-31 VITALS — BP 124/85 | HR 73 | Temp 98.1°F | Ht 70.98 in | Wt 221.0 lb

## 2023-07-31 DIAGNOSIS — G44229 Chronic tension-type headache, not intractable: Secondary | ICD-10-CM

## 2023-07-31 DIAGNOSIS — E119 Type 2 diabetes mellitus without complications: Secondary | ICD-10-CM | POA: Insufficient documentation

## 2023-07-31 DIAGNOSIS — R7303 Prediabetes: Secondary | ICD-10-CM

## 2023-07-31 DIAGNOSIS — S83511D Sprain of anterior cruciate ligament of right knee, subsequent encounter: Secondary | ICD-10-CM

## 2023-07-31 DIAGNOSIS — M545 Low back pain, unspecified: Secondary | ICD-10-CM

## 2023-07-31 DIAGNOSIS — G8929 Other chronic pain: Secondary | ICD-10-CM

## 2023-07-31 LAB — LIPID PANEL
Chol/HDL Ratio: 3.6
Cholesterol: 173 mg/dL
HDL: 48 mg/dL (ref 40–60)
LDL Calculated: 102 mg/dL
Non HDL Cholesterol: 125 mg/dL
Triglycerides: 114 mg/dL

## 2023-07-31 LAB — BASIC METABOLIC PANEL
Anion Gap: 11 (ref 7–16)
CO2: 27 mmol/L (ref 20–28)
Calcium: 10.1 mg/dL (ref 9.0–10.3)
Chloride: 103 mmol/L (ref 96–108)
Creatinine: 1.06 mg/dL (ref 0.67–1.17)
Glucose: 74 mg/dL (ref 60–99)
Lab: 12 mg/dL (ref 6–20)
Potassium: 4 mmol/L (ref 3.3–5.1)
Sodium: 141 mmol/L (ref 133–145)
eGFR BY CREAT: 92 *

## 2023-07-31 LAB — HEMOGLOBIN A1C: Hemoglobin A1C: 5.9 % — ABNORMAL HIGH

## 2023-07-31 MED ORDER — ALBUTEROL SULFATE HFA 108 (90 BASE) MCG/ACT IN AERS *I*
1.0000 | INHALATION_SPRAY | Freq: Four times a day (QID) | RESPIRATORY_TRACT | 5 refills | Status: DC | PRN
Start: 2023-07-31 — End: 2023-07-31

## 2023-07-31 MED ORDER — METFORMIN HCL 500 MG PO TB24 *I*: 500.0000 mg | ORAL_TABLET | Freq: Every day | ORAL | 3 refills | Status: DC

## 2023-07-31 MED ORDER — ATORVASTATIN CALCIUM 20 MG PO TABS *I*: 20.0000 mg | ORAL_TABLET | Freq: Every day | ORAL | 3 refills | Status: DC
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
## 2023-08-06 NOTE — Progress Notes (Unsigned)
Patient initial compliance period: 03/18/23 through 04/16/23. Two reports below.          Initial compliance:

## 2023-08-07 ENCOUNTER — Ambulatory Visit: Payer: Medicaid Other | Attending: Neurology | Admitting: Neurology

## 2023-08-07 ENCOUNTER — Other Ambulatory Visit: Payer: Self-pay

## 2023-08-07 VITALS — BP 130/84 | HR 65 | Temp 97.0°F | Ht 71.0 in | Wt 221.1 lb

## 2023-08-07 DIAGNOSIS — G4733 Obstructive sleep apnea (adult) (pediatric): Secondary | ICD-10-CM | POA: Insufficient documentation

## 2023-08-07 NOTE — Progress Notes (Signed)
[facility] : Follow Up Visit   Subjective       Dear Dr. Sammuel Cooper Foltyn is a 37 y.o. male with asthma, prediabetes, and GERD who presents to our office today for follow up of CPAP therapy.    Presenting symptoms:  -Snoring, witnessed pauses in breathing  - Epworth: 6    Sleep study findings:  HST on 01/30/2023 showed mild obstructive sleep apnea (unattended AHI 11.8, oxygen saturation nadir 87%).        Interim History:  He endorses issues acclimating to CPAP.  He has not been able to use CPAP long enough to see if there has been clinical benefit.  His main problem is mass discomfort.  He has tried a fullface mask but this was very uncomfortable.  He then tried a nasal mask with a chinstrap but this led to mouth opening and was also uncomfortable.  He also reports some rainout.  He also endorses air hunger.  No other issues or concerns.    Review Flowsheet          08/07/2023 01/30/2023   Epworth Sleep Score   EPWORTH TOTAL  3  6      Details          Patient-reported                 Therapy report:               Objective     VITALS       BP 130/84 (BP Location: Right arm, Patient Position: Sitting, Cuff Size: large adult)   Pulse 65   Temp 36.1 C (97 F)   Ht 1.803 m (5\' 11" ) Comment: per pt  Wt 100.3 kg (221 lb 1.6 oz)   SpO2 98%   BMI 30.84 kg/m        ASSESSMENT + PLAN     Obstructive Sleep Apnea:    Interrogation of Erinn's CPAP unit reveals significant air leaks.  He endorses difficulty acclimating to CPAP.  This is most likely due to mask discomfort.  I have personally fitted him with a F30 CPAP mask.  If this is uncomfortable he was encouraged to reach out to me for an update.  At that time would proceed with a mask fitting.  To address his air hunger, I have increased his minimum pressure from 5 to 6.4 cm H2O.  I have also switched his climate control to auto.  I have also provided him with information on how to adjust the tube temperature and the humidity settings.    I  encouraged him to use his device any time he is sleeping for maximum clinical benefit.  We reviewed proper care and maintenance of his CPAP device and interface. I urged Mikko to contact his DME supplier or our office if he experiences any barriers to continued CPAP use. We discussed machine cleaning and maintenance. We encouraged and discussed the importance of weight loss and also encouraged never driving while sleepy.    Follow up in 3 months.     Thank you for allowing [place] to participate in the care of your patient.  Please do not hesitate to contact my office with any questions.    Irena Cords, M.D.

## 2023-08-07 NOTE — Patient Instructions (Signed)
Adjusting the Climate Settings on Your ResMed CPAP    Climate Control    Climate Control is an intelligent system that controls the humidifier and the ClimateLineAir heated air tubing to deliver constant, comfortable temperature and humidity levels during therapy. Designed to prevent dryness of the nose and mouth, it maintains the set temperature and relative humidity while you sleep. Climate Control can be set to either Auto or Manual and is only available when both the ClimateLineAir and the HumidAir humidifier are attached.     Climate Control Auto    Climate Control Auto is the default setting. Climate Control Auto is designed to make therapy as easy as possible, so there is no need to change the temperature or humidity settings. The tube temperature is set to 51F (27C) and Climate Control automatically adjusts the humidifier output to maintain a constant, comfortable humidity level of 85% relative humidity while protecting against rainout (water droplets in the air tubing and mask).     Climate Control Manual    Designed to offer you more flexibility and control over your settings, Climate Control Manual lets you adjust the temperature and humidity to the setting which is most  comfortable for you. In Climate Control Manual, the tube temperature and the humidity level can be set independently however, rainout protection is not guaranteed. If rainout does occur, first try increasing the tube temperature. If the air temperature becomes too warm and rainout continues, try decreasing the humidity.     Tube Temperature  If the air in the mask feels too warm or too cold, adjust the temperature to find what is most comfortable for you or turn it off completely. You can set the Tube Temperature to anywhere between 60-60F (16-30C).     Humidity Level  The humidifier moistens the air and is designed to make therapy more comfortable. If you are getting a dry nose or mouth, turn up the humidity. If you are getting any  moisture in your mask, turn down the humidity. You can set the Humidity Level to Off or between 1 and 8, where 1 is the lowest humidity setting and 8 is the highest  humidity setting.     Instructions for Model 10 CPAP, BiPAP, and ASV machines:  If your machine looks like one of devices below, follow these instructions        Changing From Auto to Manual Climate Control:  1. Ensure that your tubing is plugged into the back of your device.  2. In My Options, turn the dial to highlight Climate Ctrl and then press the dial.  3. Turn the dial to select Manual and press the dial to save the change.       Adjusting Tube Temperature:  1. In My Options, turn the dial to highlight Tube Temperature and then press the dial.  2. Turn the dial to adjust the temperature and press the dial to save the change.       Adjusting Humidity:  1. In My Options, turn the dial to highlight Humidity Level and then press the dial.  2. Turn the dial to adjust the humidity level and press the dial to save the change.

## 2023-09-04 ENCOUNTER — Other Ambulatory Visit: Payer: Self-pay | Admitting: Internal Medicine

## 2023-09-04 DIAGNOSIS — S83511D Sprain of anterior cruciate ligament of right knee, subsequent encounter: Secondary | ICD-10-CM

## 2023-09-04 DIAGNOSIS — M545 Low back pain, unspecified: Secondary | ICD-10-CM

## 2023-09-04 NOTE — Progress Notes (Signed)
1. Rupture of anterior cruciate ligament of right knee, subsequent encounter (Primary)  Separate PT referrals placed upon request  - AMB REFERRAL TO PHYSICAL / [facility]CC[facility] [facility]APY [place]    2. Chronic bilateral low back pain without sciatica  Separate PT referrals placed upon request  - AMB REFERRAL TO PHYSICAL / [facility]CCUPATIONAL THERAPY - [place] [facility]

## 2023-09-12 ENCOUNTER — Ambulatory Visit: Payer: Self-pay | Admitting: Rehabilitative and Restorative Service Providers"

## 2023-09-26 ENCOUNTER — Ambulatory Visit: Payer: Self-pay | Admitting: Rehabilitative and Restorative Service Providers"
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
## 2023-10-29 ENCOUNTER — Ambulatory Visit: Payer: Self-pay | Admitting: Neurology

## 2023-10-29 ENCOUNTER — Telehealth: Payer: Self-pay | Admitting: Neurology

## 2023-10-29 NOTE — Telephone Encounter (Signed)
Copied from CRM #[id number]. Topic: Appointments - Reschedule Appointment  >> Oct 29, 2023  8:49 AM Gifford Shave wrote:  [name] is calling to cancel the patients  [facility] appointment which is currently scheduled for 12/17.    Reason for the cancellation: last minute conflict / has to go out of town    Has the appointment been cancelled? yes    Has the appointment been rescheduled? yes    Date of new appointment?3/17, he stated he actually wasn't sure if this appointment was required by insurance.    Does the patient need a call back to reschedule?no    Will the patient call back when they are ready to schedule? no    Patient can be contacted back at [phone] (home)

## 2023-11-11 ENCOUNTER — Ambulatory Visit: Payer: Self-pay | Admitting: Internal Medicine

## 2023-12-03 ENCOUNTER — Ambulatory Visit: Payer: MEDICAID | Admitting: Internal Medicine
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
## 2024-01-14 ENCOUNTER — Ambulatory Visit: Admitting: Internal Medicine

## 2024-01-14 ENCOUNTER — Encounter: Payer: Self-pay | Admitting: Internal Medicine

## 2024-01-14 ENCOUNTER — Other Ambulatory Visit: Payer: Self-pay

## 2024-01-14 VITALS — BP 137/95 | HR 71 | Temp 97.9°F | Ht 71.0 in

## 2024-01-14 DIAGNOSIS — M545 Low back pain, unspecified: Secondary | ICD-10-CM

## 2024-01-14 DIAGNOSIS — R109 Unspecified abdominal pain: Secondary | ICD-10-CM

## 2024-01-14 LAB — POCT URINALYSIS DIPSTICK
Blood,UA POCT: NEGATIVE
Glucose,UA POCT: NORMAL mg/dL
Ketones,UA POCT: NEGATIVE mg/dL
Lot #: 78306202
Nitrite,UA POCT: NEGATIVE
PH,UA POCT: 6 (ref 5–8)
Specific gravity,UA POCT: 1.015 (ref 1.002–1.030)

## 2024-01-14 MED ORDER — CYCLOBENZAPRINE HCL 5 MG PO TABS *I*
5.0000 mg | ORAL_TABLET | Freq: Three times a day (TID) | ORAL | 0 refills | Status: DC | PRN
Start: 2024-01-14 — End: 2024-05-07

## 2024-01-14 MED ORDER — KETOROLAC TROMETHAMINE 15 MG/ML IJ SOLN *I*
15.0000 mg | Freq: Once | INTRAMUSCULAR | Status: AC
Start: 2024-01-14 — End: 2024-01-14
  Administered 2024-01-14: 15 mg via INTRAMUSCULAR

## 2024-01-14 NOTE — Progress Notes (Signed)
Pt received injection for pain in PAIN LEVELS: 10 ketorolac onset of action is 10 minutes, peaks at 2 hours and relief may last up to 6 hours.  Patient instructed  follow the clinicians plan for follow up and call [facility] if problem fails to improve, or becomes worse. Per pt pain level as leaving visit is 10/10

## 2024-01-14 NOTE — Progress Notes (Signed)
 [facility] - Office Visit    SUBJECTIVE    CHIEF COMPLAINT:   Chief Complaint   Patient presents with    Kidney Problem     Problems reviewed at this visit were:  1. Right flank pain    2. Acute right-sided low back pain without sciatica      History of Present Illness  The patient is a 38 year old male who is here for evaluation of flank pain.    He reports experiencing sharp, severe pain in his right lower back, which he suspects may be due to a kidney infection or other type of infection. The onset of this pain was last Thursday, initially presenting as mild discomfort but escalating to severe, sharp pain by Sunday, rendering him unable to work. The pain is constant and disrupts his sleep. He recalls engaging in heavy lifting activities during a recent move, which he believes may have contributed to the onset of the pain. The pain is localized to the right lower back and does not radiate to the leg, abdomen, or groin. He also reports muscle spasms in the affected area. He has no urinary issues, including hematuria, dysuria, or increased frequency, although he occasionally experiences nocturia, which he attributes to high fluid intake. He also reports no systemic symptoms such as fever, nausea, vomiting, or abdominal pain. 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 The pain is exacerbated by movement, particularly when lying down or standing up.     OBJECTIVE  BP (!) 137/95   Pulse 71   Temp 36.6 C (97.9 F) (Infrared)   Ht 1.803 m (5\' 11" )   BMI 30.84 kg/m   BP Readings from Last 3 Encounters:   01/14/24 (!) 137/95   08/07/23 130/84   07/31/23 124/85     Vitals: reviewed.  Gen: alert and appropriately conversant.  Abd: soft, non-tender  Back: TTP right lower back  Pain with straight leg raise bilat    UA normal  Assessment & Plan  1. Acute right lower back pain  - Symptoms indicative of muscular strain rather than renal infection  -  Toradol injection administered today  - Advised to take over-the-counter analgesics such as Advil and Tylenol  - Rx flexeril PRN  - Encouraged to use heating pad and ice for additional relief  - Engage in stretching exercises to prevent muscle clenching  - Work note provided  - Instructed to contact office if no improvement by end of the week - would ref to PT    1. Right flank pain  POCT urinalysis dipstick      2. Acute right-sided low back pain without sciatica  ketorolac (TORADOL) 15 mg/mL injection 15 mg    cyclobenzaprine (FLEXERIL) 5 mg tablet        Follow up if symptoms worsen or fail to improve.    Santina Evans, MD MPH

## 2024-01-27 ENCOUNTER — Ambulatory Visit: Payer: Self-pay | Admitting: Sleep Medicine

## 2024-01-29 ENCOUNTER — Encounter: Payer: Self-pay | Admitting: Sleep Medicine

## 2024-01-29 ENCOUNTER — Ambulatory Visit: Attending: Sleep Medicine | Admitting: Sleep Medicine

## 2024-01-29 DIAGNOSIS — G4733 Obstructive sleep apnea (adult) (pediatric): Secondary | ICD-10-CM

## 2024-01-29 MED ORDER — POSITIVE AIRWAY PRESSURE SUPPLIES MISC *A*
0 refills | Status: AC
Start: 2024-01-29 — End: ?

## 2024-01-29 NOTE — Patient Instructions (Signed)
12 Practice Tips for Getting the Most out of Your  CPAP/BiPAP Treatment    #1: Adjust your humidifier levels seasonally    You'll need more moisture during the winter months, so move your humidity levels up (1/2 to 1 level at a time until you're satisfied). During the summer months, when air is warmer and more humid, you can turn down your humidifier levels.    #2: Keep your CPAP at or slightly beneath height of your head and make sure it is placed on a hard surface.    Condensation may run down into your hose if it's above your head and you risk having your machine fall on you.    #3: Always use distilled water in your machine.    Tap water generally contains fluoride and other substances that may be harmful to your lungs. Further, the use of non-distilled water can lead to white mineral deposits  accumulating in your water chamber. Imagine the inside of a shower with hard water stains.    #4: If you feel claustrophobic or are having trouble getting used to your mask practice by holding the mask up to your face without any of the other parts.     Once you're comfortable with that, try wearing the mask with the straps. Hold the mask and hose (without the straps) with the hose attached to the CPAP machine at a low pressure setting (turn the ramp feature on). Wear the mask with the straps air pressure machine turned on while awake and use your CPAP for 15 to 20-minute periods while relaxing, watching TV, etc. After you're comfortable with that, try sleeping with it on.    #5: Although it is normal and usually temporary, some of our patients have trouble falling asleep when they begin treatment. If this is your situation, consider     Using your machine's "ramp" feature to gradually increase air pressure over time.  Avoiding caffeine and alcohol before bedtime.  Exercising regularly.  Taking a warm bath before bedtime.  Avoiding going to bed until you're tired.    #6: If you experience difficulty tolerating the air  pressure try using your machine's "ramp" feature.     This will allow you to gradually increase air pressure over time. You can also "re-set the ramp" feature if you are waking up to high pressures during the night.     #7: Once you've adjusted to your CPAP machine use it consistently - every time you sleep - including for naps.    #8: If you regularly suffer with a dry or stuffy nose you should consider increasing the humidifier level on your CPAP device.     You may also need to increase the temperature on your tubing to prevent condensation from the increased humidity. Your doctor may also recommend an over-the-counter nasal steroid spray for you or you can swab your nasal passages with nasal saline gel. You may also use a nasal wash, such as a sinus rinse bottle or Neti-pot. Avoid the use of petroleum jelly- based products in your nose.    #9: Spend a few minutes each day cleaning your equipment    For cleaning we suggest using liquid dish detergent and warm water. Avoid using hot water. Always make sure you remove all of the soap. Most manufacturers of CPAP masks include cleaning instructions in the packaging as well. After cleaning your tubes and hoses in warm soapy water and rinsing them thoroughly, you can let them air dry by   hanging them over a towel rack or shower rod. The use of ozone or UV light CPAP cleaners is not recommended by the CPAP manufacturer or the FDA.    #10: Change your filter once a month and any time you notice the filter is changing colors.      #11: Try using zinc oxide ointment for soothing minor skin irritations.     #12: If you are experiencing any difficulty adjusting to using CPAP at home, please don't hesitate to call us at [phone] or contact your sleep physician or PA via MyChart. We have CPAP specialists who can help troubleshoot any issues that may come up.     [facility]:   HOW TO ADJUST HUMIDITY AND TUBE TEMPERATURE SETTINGS:         How do I find the  Climate Control Manual mode?  AirSense 10T users:   You can access the Climate Control Manual setting anytime. From your machine's Home screen:  Select My Options  Select Climate Ctrl  Change default "Auto" setting to Manual  Select Humidity Level and turn the dial to change the humidity (1-8; default setting is 4)  Select Tube Temp and turn the dial to set the ClimateLine/ClimateLineAir heated tube to the temperature you find most comfortable (60-86?F; default setting is 81?F).  For more details, see your ClimateLineAir user guide. You can also find helpful videos on YouTube that will walk you through the process    S9 users:   Your equipment supplier must turn on your Climate Control Manual setting for you if they haven't already. From your machine's Home screen:  For humidity: Turn the dial to highlight the water drop icon; push the dial, turning the background yellow. Then turn the dial again to set the humidity (1-6, default setting is 3). Push the dial once more to set the new humidity level.  For tube temperature: Turn the dial to highlight the thermometer icon; push the dial, turning that background yellow. Then turn the dial again to set the ClimateLine/ClimateLineAir tube temperature (60-86?F, default setting is 80?F). Push the dial once more to set the new temp.    When should I change the humidity vs. the temperature?  Most patients find cooler air easier to breathe while trying to sleep, especially those who are new to CPAP, wear a full face CPAP mask, and women experiencing hot flashes at bedtime. However, warmer air provides the best humidity and helps reduce nasal irritation since your nose doesn't have to warm all that CPAP air on its own. It generally helps to increase:  Humidity if dry air is causing you to wake up with dry mouth, an uncomfortable side effect that 40% of CPAP users experience.1 If you want more humidity, try manually adjusting it and the temperature one notch at a time. If you  reach the highest levels and still feel dry, talk to your equipment supplier and doctor about whether mask leak, oral medications or other factors may be the real cause of your dryness. (You should also check for mask leak if you wake up to find your humidifier's water chamber empty.)   Tube temperature if you're experiencing dryness despite increasing the humidity. Consider increasing your tube temperature by just 1-2? F to see if that provides the best comfort.  Source: resmed.com    Link to a video that shows you how to make adjustments to the humidity and tube temperature on the ResMed CPAP unit:       https://youtu.be/[email or web address]    Rules of Replacement     The life of CPAP supplies depends on the brand and style you buy, how well you care for them, and how often you use them. Follow cleaning instructions carefully to keep them looking and working their best.     Even if your CPAP supplies sparkle, it doesn't mean they'll last forever. Even with the best cleaning, parts can eventually grow bacteria and make you sick if they're not replaced regularly. That's why most masks and accessories follow some general rules for replacement. You also should check with your supplier for specific details about your mask.     Here are some general rules for replacing your CPAP supplies:    What: Air Filters  Why: Filters can wear out or become clogged over time, potentially exposing you to airborne particles and mold.  When: As soon as it becomes discolored or at least every 60 days.     * Most filters are disposable and are not meant to be cleaned, but discarded when dirty. If you are using an older device, you may have a non-disposable filter which should be cleaned as soon as it becomes discolored and replaced every 6-12 months.      * Keep a closer eye on your filter in the fall and winter -- they tend to get dustier when the heat is on and the windows are closed. If you live in a house with pets or smoke, you may need to  replace the filters more often.    What: Mask cushion/nasal pillow  Why: The material will soften with wear and eventually becomes too soft to hold a seal. This can lead to increased mask leaks which may disrupt your sleep and reduce the effectiveness of CPAP therapy.   When: Every 6-8 weeks. Consider replacing your cushion/pillow sooner if your straps need to be tightened to get the same mask seal.     What: Headgear  Why: The headgear straps lose elasticity over time and must be tightened more and more to achieve the same quality seal. Overtightening can cause facial sores and make treatment uncomfortable.   When: Every 6-12 months.    What: Hose/Tubing   Why: The hose may develop small holes or tears, which can cause air leaks. If your hose is leaking, you may not be receiving your prescribed therapy setting from your CPAP. This can cause you to feel like you're not sleeping as well.  When: About every 12 months.    * Signs of wear to your hose include dry, cracked places on the inside lining or on the rubber ends; "stretch marks" near the rubber ends; mineral deposits or mold from water left inside the hose; or a visible puncture or tear in the material.     What: Water Chamber  Why: Water chambers may become discolored, cracked, cloudy, or even pitted due to the mineral levels found in most tap and drinking water. As the material deteriorates, cracks may trap bacteria from moisture.  When: About every 12 months.     Who's got you covered?    Medicare and most private insurance plans cover the supplies recommended above. Ask your durable medical equipment supplier about your specific coverage. Some equipment suppliers use automatic systems for replacement that can save you extra work and worry. They'll check the type of supplies your plan replaces, how often they'll replace them, and then they'll ship them to you automatically. If your company doesn't have a   plan like this, see if they can email or call you with  a reminder to replace your supplies. If you have higher co-pays or pay out of pocket, ask your supplier about getting a mask with removable parts. It can be cheaper to replace single parts than having to replace the whole mask. By using clean, fresh, up-to-date CPAP supplies, you'll stay healthier, sleep deeper, and breathe easier.    Types of CPAP Masks    Adjusting to CPAP masks can take time. Often times, people have to try more than one style of mask before findings the perfect fit. There are three main types of masks: nasal pillows, nasal masks, and full face masks.    Nasal pillows rest at the entrance of the nostrils below the nose.  Pros:  Provide a feeling of openness and freedom, which helps you sleep more comfortably with fewer interruptions.   Keeps your field of vision clear--beneficial for those who wear glasses, or like reading or watching television before going to bed.   Causes less air leakage due to direct airflow into the nasal passages.   Great for patients with lots of facial hair who have trouble getting a seal with a standard CPAP mask that fits over the nose or nose and mouth.   Reduces the feeling of claustrophobia due to having minimal coverage over the face.  Cons:  Causes discomfort when used under higher-pressure due to direct airflow into nostrils.   May cause nosebleeds and dryness of the nose.   Patients with sensitive skin may develop an allergic reaction to the plastic.   Using ill-fitting nasal pillows could lead to pressure ulcers within the nostrils.  Nasal pillows are not the best option for mouth breathers    Nasal masks cover the nose and are lighter and smaller than full face masks, but provide better coverage than a nasal pillow.  Pros:  Works best if you mostly breathe through your nose only.   Nasal cushions create a good seal around your nose, while keeping a small profile. The masks also come with forehead support, which gives you stability and support for your mask.    Provide more natural airflow than nasal pillows, as the delivered pressure isn't as direct.   Better for higher-pressure settings than nasal pillows.   Offers flexible fit. There are many different styles cater to a wide range of facial structures and features.  Cons:  Nasal masks are not the best option for mouth breathers.   May cause red marks around the nose due to tightening the mask too much.   May cause extra discomfort if worn by those who have sinus issues that are allergy or cold-related.    Full face masks involve both the nose and mouth. Traditional full face masks are triangular shaped and cover both the nose and the mouth. A hybrid full face mask sits under the nose and covers the mouth. The hybrid style masks tend to be smaller and can be useful if you are experiencing claustrophobia but require a full face mask.   Pros:  Great for mouth breathers.   Great for those who suffer from frequent incidents of dry mouth from CPAP   Effectively prevents mask leakage from the mouth for those who sleep on their back.   Works best with patients who require higher CPAP pressure levels. Due to the larger surface area of the mask, higher pressures do not make you feel uncomfortable.   Ideal for some patients who   find CPAP masks to be claustrophobic because the full-face mask touches the outside of the face instead of the upper lip and or the bridge of the nose.   Cons:  Due to the mask's larger surface area, there is a higher chance of air leakage.   May cause dry, irritated eyes.   Difficult to watch TV, read or wear glasses while wearing the mask.   Not great for stomach sleepers due to the bulk of the mask.    * adapted from [employer] - Website      Top Ten Mask Issues and How to Solve Them      1. How do I get used to wearing a CPAP mask?  You need to take small steps to get accustomed to wearing your CPAP mask.  Try wearing the mask during the day when you're watching TV or  reading a book. Sometimes simply wearing the mask while you're cooking or even surfing the Internet can help you get used to wearing it at night.  Once you become accustomed to how the mask feels on your face, start wearing the CPAP mask everytime you sleep at night, and even during naps.  The reality is that the less you wear the mask, the harder it will be to get used to wearing it. So use the device for several weeks or more to see if the mask and pressure settings you were prescribed still work for you.    2. My CPAP mask is uncomfortable to wear at night!  When it comes to getting a new CPAP mask, it's important that you work closely with your doctor and CPAP supplier to make sure that the mask and device suit your needs and that it fits you properly.  Ask your provider, sleep technologist, or CPAP supplier to show you how to adjust your mask to get the best fit and read manufacturer product instructions, which can also help you get a better idea about proper fit.  The good news is that many mask styles are available. Schedule a time to meet with your CPAP supplier for a mask fitting to discuscs the styles available to you.    3. Am I allergic to my CPAP mask?  Here tips on how you can check whether you are allergic to your CPAP mask:  First, stop wearing the CPAP mask and immediately contact your physician. Usually an allergic reaction to a CPAP mask will occur the same night you wear it.   Ask yourself how frequently you clean your mask. Almost 9 out of 10 times, what appears to be an allergic reaction to a CPAP mask (such as a bruise on the face or a skin infection) is caused by infrequent cleaning of the mask.   Check whether your mask is an old version made with latex. The majority of the CPAP masks in the market today are made from silicone, and a few are made from some type of gel material. Almost all are latex free.    4. I can't tolerate the forced air from the CPAP mask.  You may be able to overcome  this issue by using the "ramp" feature on the CPAP machine.  The "ramp" feature allows you to start with low air pressure, which is then followed by an automatic, gradual increase that eventually sets itself to the pressure you were prescribed by your doctor. The rate of this "ramp" feature can be adjusted by your doctor as well.       5. My nose is running or stuffy after wearing the CPAP mask!  First, check if your CPAP device comes with a heated humidifier. Usually these symptoms can be alleviated by the use of humidifier. If your CPAP machine does not have one, consider getting one that allows you to adjust the level of humidification.  Consider using a nasal saline spray at bedtime to prevent your nose from overdrying. And finally, make sure that your mask is actually fitting well; a leaky mask can dry out your nose.     6. I feel claustrophobic when I'm wearing the CPAP mask.  Start off by having a positive attitude about your CPAP treatment.  You may not know it, but the CPAP machine and mask is there to improve the quality of your life; significantly in the long run.  Follow our advice on getting used to wearing your CPAP mask and above all, keep in mind that successful CPAP therapy sometimes requires patience as you adjust to therapy.   Practice wearing your CPAP mask while you're awake. Practice by first just holding the mask up to your face without any of the other parts attached. Once you're comfortable with that, try wearing the mask with the straps.   Take small steps to get used to the CPAP mask. Try holding the mask with the hose connected to your face, without using the straps. Have the hose attached to the CPAP machine at a low-pressure setting (with ramp feature turned on). And, finally, wear the mask with the straps and with the air pressure machine turned on while awake. After you're comfortable with that, try sleeping with it on.   Try relaxation exercises. Certain exercises, such as progressive  muscle relaxation, also may help reduce your anxiety about wearing your CPAP mask. It may help to get a different size mask or try a different style, such as one that uses nasal pillows.    7. I can't fall asleep easily with the CPAP mask on.  This is a normal, temporary problem that occurs most often with patients new to CPAP therapy. Follow our advice on getting used to your CPAP machine and try out the "ramp" feature of your machine.  Also make sure that you're practicing good sleep hygeine, which includes: exercising regularly and avoiding caffeine and alcohol before bedtime.    8. Why do I have dry mouth after wearing my CPAP mask?  If you breathe through your mouth at night or sleep with your mouth open, CPAP may worsen dry mouth. A chin strap may help keep your mouth closed and reduce the air leak if you wear a nasal mask. But once again, make sure that you're wearing the right kind of mask and try adjusting your CPAP machine's heated humidifier to see if that helps.     9. I keep on taking my CPAP mask off at night while sleeping.  It's normal to sometimes wake up to find that you've removed the mask in your sleep. If you move a lot in your sleep, you may find that a full face mask will stay on your face better. You may be pulling off the mask because your nose is congested. If so, ensuring a good mask fit and adding a heated humidifier to your CPAP machine may help. A chin strap also may help you to keep the device on your face.  If this is a consistent problem, consider setting an alarm for sometime in the night, to check whether the   device is still on. You could progressively set the alarm for later in the night if you find you're keeping the device on longer.    10. Why is the CPAP machine so loud?  The CPAP machine is most likely a lot quieter than your snoring, but if noise is a problem, you have several options.  Most new models of CPAP devices are almost silent. But if you find a device's noise is  bothersome, first check to make sure the device air filter is clean and unblocked. Something in its way may be contributing to noise.   If this doesn't help, have your doctor, sleep technologist, or CPAP supplier check the device to ensure it's working properly. If the device is working correctly and the noise still bothers you, try wearing earplugs or getting a fan to turn on at night to make"white noise" that can also help to disguise the noise of the CPAP machine.      * adapted from [employer] - Website    [facility]:    Information about Drowsy Driving    Drowsy driving is a common - and often deadly - danger on our roads. It occurs when you are too tired to remain alert while operating a motor vehicle. As a result you may struggle to stay focused on the road and fail to practice safe driving techniques.     Feeling drowsy makes you easily distracted. You may be unable to focus and pay attention. This can lead to increased errors and a decreased ability to detect and correct mistakes. Your thinking can be slowed and your response time delayed. People who are sleepy also tend to be unaware that their performance and alertness are suffering.     Drowsy driving can be as deadly as drunk driving, putting yourself, your passengers and other drivers in danger. Common mistakes while driving drowsy include:  Following too closely behind the vehicle in front of you   Failing to realize that you are driving too fast   Drifting into another lane or off the road without knowing it   Falling asleep behind the wheel and losing control of your vehicle     Accidents caused by drowsy driving tend to share some common features. These include:  Time of Day: Accidents often occur late at night or early in the morning during the body's natural sleep period. Accidents also can occur when sleepiness peaks after lunch in the middle of the afternoon.  Speed: Accidents often occur at high  speeds on highways and other major roadways.  Solitude: Accidents often involve one vehicle that veers off the road. Drivers often are alone in the vehicle.  No Brakes: Drivers often make no attempt to apply the brakes or avoid the crash.  Severity: Accidents often cause severe injuries or death.    Anyone who doesn't get enough sleep can drive drowsy. But certain people have a higher risk than others, including people who:  Take medications that cause sleepiness   Drink alcohol, which increases drowsiness   Work night shifts or rotating shifts   Have a sleep disorder     Do you best to avoid drowsy driving. Rolling down the windows or turning up the volume on the radio will do little to increase your alertness while driving. These are some better ways to avoid drowsy driving:  Get a full night of seven to eight hours of sleep before driving  Avoid driving late at   night  Avoid driving alone  On a long trip, share the driving with another passenger  When the signs of fatigue begin to show, get off the road. Take a short nap in a well-lit area. Do not simply stop on the side of the road.   Use caffeine for a short-term boost  Take a short nap after consuming caffeine to maximize the effect  Arrange for someone to give you a ride home after working a late shift    Drowsy Driving Statistics    Unfortunately, drowsy driving occurs all too often. The [place] National Highway Traffic Safety Administration (NHTSA) reports that drowsy driving causes more than 100,000 crashes a year. These accidents result in an estimated 40,000 injuries and 1,550 deaths.    In reality, all of these numbers are probably much higher than the statistics show. It is difficult to know how drowsy someone was prior to an accident. Unlike drunk driving, there is no "breathalyzer" test for drowsiness. So unless a driver admits falling asleep, drowsy driving often goes unreported.   In 2002 [employer] surveyed more than 4,000 drivers in the  [place] Survey results show that 37 percent of drivers reported nodding off or falling asleep at least once while driving. Males were almost twice as likely as females to report that they had driven drowsy. [employer] estimated that 7.5 million drivers had nodded off while driving in the past month.    The only sure way to detect drowsy driving would be to monitor the alertness of people as they drive. That's exactly what the [employer] did in a naturalistic driving study. They installed video cameras and sensors in 100 vehicles that were driven by ordinary people every day for one year. The study gathered nearly 2 million miles of real-world driving data. It found that driver drowsiness was a contributing factor in at least 20 percent of all crashes. The researchers estimated that you are four to six times more likely to have a crash or near crash when you are driving drowsy.
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
## 2024-01-30 ENCOUNTER — Encounter: Payer: Self-pay | Admitting: Internal Medicine

## 2024-02-04 ENCOUNTER — Other Ambulatory Visit: Payer: Self-pay

## 2024-02-04 ENCOUNTER — Ambulatory Visit: Payer: Self-pay | Admitting: Primary Care

## 2024-02-04 ENCOUNTER — Encounter: Payer: Self-pay | Admitting: Primary Care

## 2024-02-04 VITALS — BP 155/103 | HR 94 | Temp 97.3°F

## 2024-02-04 DIAGNOSIS — W3400XA Accidental discharge from unspecified firearms or gun, initial encounter: Secondary | ICD-10-CM

## 2024-02-04 DIAGNOSIS — M545 Low back pain, unspecified: Secondary | ICD-10-CM

## 2024-02-04 DIAGNOSIS — S82209A Unspecified fracture of shaft of unspecified tibia, initial encounter for closed fracture: Secondary | ICD-10-CM

## 2024-02-04 DIAGNOSIS — K0889 Other specified disorders of teeth and supporting structures: Secondary | ICD-10-CM

## 2024-02-04 NOTE — Patient Instructions (Addendum)
-   Schedule appt w Dr. Drinda Butts: Physical Medicine & Rehabilitation [phone]    - Schedule appt Physical therapy: [facility] at [facility] [phone]
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
## 2024-02-18 ENCOUNTER — Encounter: Payer: Self-pay | Admitting: Gastroenterology

## 2024-03-09 ENCOUNTER — Ambulatory Visit: Payer: Self-pay | Admitting: Internal Medicine

## 2024-03-09 NOTE — Progress Notes (Deleted)
 [facility] - Office Visit    SUBJECTIVE    CHIEF COMPLAINT: No chief complaint on file.    Problems reviewed at this visit were:  No diagnosis found.  Last seen Mar 25.  History of Present Illness      OBJECTIVE  There were no vitals taken for this visit.  BP Readings from Last 3 Encounters:   02/04/24 (!) 155/103   01/14/24 (!) 137/95   08/07/23 130/84     Vitals: reviewed.  Gen: alert and appropriately conversant.    Assessment & Plan    No diagnosis found.  No follow-ups on file.    Berlinda Breeze, MD MPH

## 2024-03-27 ENCOUNTER — Emergency Department: Payer: Self-pay

## 2024-03-27 ENCOUNTER — Emergency Department
Admission: EM | Admit: 2024-03-27 | Discharge: 2024-03-28 | Disposition: A | Discharge status: Home / Self Care | Payer: Self-pay | Source: Ambulatory Visit | Attending: Emergency Medicine | Admitting: Emergency Medicine

## 2024-03-27 DIAGNOSIS — Y9389 Activity, other specified: Secondary | ICD-10-CM | POA: Insufficient documentation

## 2024-03-27 DIAGNOSIS — Y92410 Unspecified street and highway as the place of occurrence of the external cause: Secondary | ICD-10-CM | POA: Insufficient documentation

## 2024-03-27 DIAGNOSIS — T24221A Burn of second degree of right knee, initial encounter: Secondary | ICD-10-CM | POA: Insufficient documentation

## 2024-03-27 DIAGNOSIS — T22211A Burn of second degree of right forearm, initial encounter: Secondary | ICD-10-CM | POA: Insufficient documentation

## 2024-03-27 DIAGNOSIS — M25511 Pain in right shoulder: Secondary | ICD-10-CM

## 2024-03-27 DIAGNOSIS — M79601 Pain in right arm: Secondary | ICD-10-CM

## 2024-03-27 DIAGNOSIS — N179 Acute kidney failure, unspecified: Secondary | ICD-10-CM | POA: Insufficient documentation

## 2024-03-27 DIAGNOSIS — M25561 Pain in right knee: Secondary | ICD-10-CM

## 2024-03-27 DIAGNOSIS — T22212A Burn of second degree of left forearm, initial encounter: Secondary | ICD-10-CM | POA: Insufficient documentation

## 2024-03-27 DIAGNOSIS — T25211A Burn of second degree of right ankle, initial encounter: Secondary | ICD-10-CM | POA: Insufficient documentation

## 2024-03-27 DIAGNOSIS — F17210 Nicotine dependence, cigarettes, uncomplicated: Secondary | ICD-10-CM | POA: Insufficient documentation

## 2024-03-27 DIAGNOSIS — T22291A Burn of second degree of multiple sites of right shoulder and upper limb, except wrist and hand, initial encounter: Secondary | ICD-10-CM | POA: Insufficient documentation

## 2024-03-27 DIAGNOSIS — M545 Low back pain, unspecified: Secondary | ICD-10-CM | POA: Insufficient documentation

## 2024-03-27 DIAGNOSIS — M25521 Pain in right elbow: Secondary | ICD-10-CM

## 2024-03-27 DIAGNOSIS — T310 Burns involving less than 10% of body surface: Secondary | ICD-10-CM | POA: Insufficient documentation

## 2024-03-27 DIAGNOSIS — R109 Unspecified abdominal pain: Secondary | ICD-10-CM | POA: Insufficient documentation

## 2024-03-27 DIAGNOSIS — T22251A Burn of second degree of right shoulder, initial encounter: Secondary | ICD-10-CM | POA: Insufficient documentation

## 2024-03-27 DIAGNOSIS — Y998 Other external cause status: Secondary | ICD-10-CM | POA: Insufficient documentation

## 2024-03-27 LAB — HOLD GREEN NO GEL

## 2024-03-27 LAB — CBC AND DIFFERENTIAL
Baso # K/uL: 0.1 10*3/uL (ref 0.0–0.2)
Eos # K/uL: 0.1 10*3/uL (ref 0.0–0.5)
Hematocrit: 41 % (ref 37–52)
Hemoglobin: 13.7 g/dL (ref 12.0–17.0)
IMM Granulocytes #: 0.1 10*3/uL — ABNORMAL HIGH (ref 0.0–0.0)
IMM Granulocytes: 0.6 %
Lymph # K/uL: 2 10*3/uL (ref 1.0–5.0)
MCV: 79 fL (ref 75–100)
Mono # K/uL: 1.2 10*3/uL — ABNORMAL HIGH (ref 0.1–1.0)
Neut # K/uL: 16.4 10*3/uL — ABNORMAL HIGH (ref 1.5–6.5)
Nucl RBC # K/uL: 0 10*3/uL (ref 0.0–0.0)
Nucl RBC %: 0 /100{WBCs} (ref 0.0–0.2)
Platelets: 340 10*3/uL (ref 150–450)
RBC: 5.2 MIL/uL (ref 4.0–6.0)
RDW: 14.7 % (ref 0.0–15.0)
Seg Neut %: 82.3 %
WBC: 19.8 10*3/uL — ABNORMAL HIGH (ref 3.5–11.0)

## 2024-03-27 LAB — BASIC METABOLIC PANEL
Anion Gap: 18 — ABNORMAL HIGH (ref 7–16)
CO2: 19 mmol/L — ABNORMAL LOW (ref 20–28)
Calcium: 10.2 mg/dL (ref 9.0–10.3)
Chloride: 102 mmol/L (ref 96–108)
Creatinine: 1.85 mg/dL — ABNORMAL HIGH (ref 0.67–1.17)
Glucose: 124 mg/dL — ABNORMAL HIGH (ref 60–99)
Lab: 23 mg/dL — ABNORMAL HIGH (ref 6–20)
Potassium: 4.6 mmol/L (ref 3.3–5.1)
Sodium: 139 mmol/L (ref 133–145)
eGFR BY CREAT: 47 * — AB

## 2024-03-27 LAB — PROTIME-INR
INR: 1.1 (ref 0.9–1.1)
Protime: 12.7 s (ref 10.0–12.9)

## 2024-03-27 MED ORDER — IOHEXOL 350 MG/ML (OMNIPAQUE) IV SOLN 500ML BOTTLE *I*
1.0000 mL | Freq: Once | INTRAVENOUS | Status: AC
Start: 2024-03-27 — End: 2024-03-28
  Administered 2024-03-28: 146 mL via INTRAVENOUS

## 2024-03-27 MED ORDER — MORPHINE SULFATE 4 MG/ML IV SOLN *WRAPPED*
4.0000 mg | Freq: Once | INTRAVENOUS | Status: AC
Start: 2024-03-27 — End: 2024-03-27
  Administered 2024-03-27: 4 mg via INTRAVENOUS
  Filled 2024-03-27: qty 1
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
## 2024-03-28 ENCOUNTER — Emergency Department: Payer: Self-pay

## 2024-03-28 ENCOUNTER — Other Ambulatory Visit: Payer: Self-pay

## 2024-03-28 DIAGNOSIS — S199XXA Unspecified injury of neck, initial encounter: Secondary | ICD-10-CM

## 2024-03-28 DIAGNOSIS — S299XXA Unspecified injury of thorax, initial encounter: Secondary | ICD-10-CM

## 2024-03-28 DIAGNOSIS — M2578 Osteophyte, vertebrae: Secondary | ICD-10-CM

## 2024-03-28 DIAGNOSIS — S4991XA Unspecified injury of right shoulder and upper arm, initial encounter: Secondary | ICD-10-CM

## 2024-03-28 DIAGNOSIS — S3991XA Unspecified injury of abdomen, initial encounter: Secondary | ICD-10-CM

## 2024-03-28 DIAGNOSIS — R918 Other nonspecific abnormal finding of lung field: Secondary | ICD-10-CM

## 2024-03-28 DIAGNOSIS — S59911A Unspecified injury of right forearm, initial encounter: Secondary | ICD-10-CM

## 2024-03-28 DIAGNOSIS — S0990XA Unspecified injury of head, initial encounter: Secondary | ICD-10-CM

## 2024-03-28 DIAGNOSIS — S8991XA Unspecified injury of right lower leg, initial encounter: Secondary | ICD-10-CM

## 2024-03-28 DIAGNOSIS — S59901A Unspecified injury of right elbow, initial encounter: Secondary | ICD-10-CM

## 2024-03-28 DIAGNOSIS — J479 Bronchiectasis, uncomplicated: Secondary | ICD-10-CM

## 2024-03-28 DIAGNOSIS — S3992XA Unspecified injury of lower back, initial encounter: Secondary | ICD-10-CM

## 2024-03-28 MED ORDER — MORPHINE SULFATE 4 MG/ML IV SOLN *WRAPPED*
4.0000 mg | Freq: Once | INTRAVENOUS | Status: AC
Start: 2024-03-28 — End: 2024-03-28
  Administered 2024-03-28: 4 mg via INTRAVENOUS
  Filled 2024-03-28: qty 1

## 2024-03-28 MED ORDER — OXYCODONE HCL 5 MG PO TABS *I*
5.0000 mg | ORAL_TABLET | Freq: Once | ORAL | Status: AC
Start: 2024-03-28 — End: 2024-03-28
  Administered 2024-03-28: 5 mg via ORAL
  Filled 2024-03-28: qty 1

## 2024-03-28 MED ORDER — SODIUM CHLORIDE 0.9 % IV BOLUS *I*
1000.0000 mL | Freq: Once | Status: AC
Start: 2024-03-28 — End: 2024-03-28
  Administered 2024-03-28: 1000 mL via INTRAVENOUS

## 2024-03-28 MED ORDER — OXYCODONE HCL 5 MG PO TABS *I*
5.0000 mg | ORAL_TABLET | ORAL | 0 refills | Status: DC | PRN
Start: 2024-03-28 — End: 2024-05-07
  Filled 2024-03-28: qty 13, 3d supply, fill #0

## 2024-03-28 NOTE — ED Notes (Signed)
 Discharge Note___________________________________    Reviewed AVS with Yolanda Hence    A&Ox4. Ambulatory and stable. All questions answered, no concerns voiced. Verbalized understanding of all discharge instructions and a copy was provided. Vitals taken prior to discharge. And IV taken out     [employer] and footwear were appropriate for weather upon discharge.  ______________________________________________________    Patient accompanied by: brother   Mode of transportation: car  Discharged destination: home    ______________________________________________________    Signed by: Ascencion Black Dalissa Lovin, RN as of 03/28/2024 at 5:00 AM

## 2024-03-31 ENCOUNTER — Encounter: Payer: Self-pay | Admitting: Surgery

## 2024-03-31 ENCOUNTER — Other Ambulatory Visit: Payer: Self-pay

## 2024-03-31 ENCOUNTER — Ambulatory Visit: Payer: Self-pay | Attending: Surgery | Admitting: Surgery

## 2024-03-31 VITALS — BP 127/94 | HR 83 | Temp 98.2°F | Resp 16 | Ht 71.0 in | Wt 225.8 lb

## 2024-03-31 DIAGNOSIS — T300 Burn of unspecified body region, unspecified degree: Secondary | ICD-10-CM
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
## 2024-04-07 ENCOUNTER — Ambulatory Visit: Payer: Self-pay

## 2024-04-13 ENCOUNTER — Other Ambulatory Visit: Payer: Self-pay

## 2024-04-13 ENCOUNTER — Ambulatory Visit: Attending: Surgery | Admitting: Surgery

## 2024-04-13 DIAGNOSIS — T300 Burn of unspecified body region, unspecified degree: Secondary | ICD-10-CM
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
## 2024-04-27 ENCOUNTER — Encounter: Payer: Self-pay | Admitting: Internal Medicine

## 2024-05-06 ENCOUNTER — Other Ambulatory Visit: Payer: Self-pay

## 2024-05-07 ENCOUNTER — Encounter: Payer: Self-pay | Admitting: Sleep Medicine

## 2024-05-07 ENCOUNTER — Ambulatory Visit: Payer: Self-pay | Attending: Sleep Medicine | Admitting: Sleep Medicine

## 2024-05-07 VITALS — BP 137/90 | HR 94 | Temp 98.0°F | Ht 71.0 in | Wt 217.0 lb

## 2024-05-07 DIAGNOSIS — G4733 Obstructive sleep apnea (adult) (pediatric): Secondary | ICD-10-CM | POA: Insufficient documentation

## 2024-05-07 NOTE — Patient Instructions (Signed)
12 Practice Tips for Getting the Most out of Your  CPAP/BiPAP Treatment    #1: Adjust your humidifier levels seasonally    You'll need more moisture during the winter months, so move your humidity levels up (1/2 to 1 level at a time until you're satisfied). During the summer months, when air is warmer and more humid, you can turn down your humidifier levels.    #2: Keep your CPAP at or slightly beneath height of your head and make sure it is placed on a hard surface.    Condensation may run down into your hose if it's above your head and you risk having your machine fall on you.    #3: Always use distilled water in your machine.    Tap water generally contains fluoride and other substances that may be harmful to your lungs. Further, the use of non-distilled water can lead to white mineral deposits  accumulating in your water chamber. Imagine the inside of a shower with hard water stains.    #4: If you feel claustrophobic or are having trouble getting used to your mask practice by holding the mask up to your face without any of the other parts.     Once you're comfortable with that, try wearing the mask with the straps. Hold the mask and hose (without the straps) with the hose attached to the CPAP machine at a low pressure setting (turn the ramp feature on). Wear the mask with the straps air pressure machine turned on while awake and use your CPAP for 15 to 20-minute periods while relaxing, watching TV, etc. After you're comfortable with that, try sleeping with it on.    #5: Although it is normal and usually temporary, some of our patients have trouble falling asleep when they begin treatment. If this is your situation, consider     Using your machine's "ramp" feature to gradually increase air pressure over time.  Avoiding caffeine and alcohol before bedtime.  Exercising regularly.  Taking a warm bath before bedtime.  Avoiding going to bed until you're tired.    #6: If you experience difficulty tolerating the air  pressure try using your machine's "ramp" feature.     This will allow you to gradually increase air pressure over time. You can also "re-set the ramp" feature if you are waking up to high pressures during the night.     #7: Once you've adjusted to your CPAP machine use it consistently - every time you sleep - including for naps.    #8: If you regularly suffer with a dry or stuffy nose you should consider increasing the humidifier level on your CPAP device.     You may also need to increase the temperature on your tubing to prevent condensation from the increased humidity. Your doctor may also recommend an over-the-counter nasal steroid spray for you or you can swab your nasal passages with nasal saline gel. You may also use a nasal wash, such as a sinus rinse bottle or Neti-pot. Avoid the use of petroleum jelly- based products in your nose.    #9: Spend a few minutes each day cleaning your equipment    For cleaning we suggest using liquid dish detergent and warm water. Avoid using hot water. Always make sure you remove all of the soap. Most manufacturers of CPAP masks include cleaning instructions in the packaging as well. After cleaning your tubes and hoses in warm soapy water and rinsing them thoroughly, you can let them air dry by   hanging them over a towel rack or shower rod. The use of ozone or UV light CPAP cleaners is not recommended by the CPAP manufacturer or the FDA.    #10: Change your filter once a month and any time you notice the filter is changing colors.      #11: Try using zinc oxide ointment for soothing minor skin irritations.     #12: If you are experiencing any difficulty adjusting to using CPAP at home, please don't hesitate to call us at [phone] or contact your sleep physician or PA via MyChart. We have CPAP specialists who can help troubleshoot any issues that may come up.     [facility]:   HOW TO ADJUST HUMIDITY AND TUBE TEMPERATURE SETTINGS:         How do I find the  Climate Control Manual mode?  AirSense 10T users:   You can access the Climate Control Manual setting anytime. From your machine's Home screen:  Select My Options  Select Climate Ctrl  Change default "Auto" setting to Manual  Select Humidity Level and turn the dial to change the humidity (1-8; default setting is 4)  Select Tube Temp and turn the dial to set the ClimateLine/ClimateLineAir heated tube to the temperature you find most comfortable (60-86?F; default setting is 81?F).  For more details, see your ClimateLineAir user guide. You can also find helpful videos on YouTube that will walk you through the process    S9 users:   Your equipment supplier must turn on your Climate Control Manual setting for you if they haven't already. From your machine's Home screen:  For humidity: Turn the dial to highlight the water drop icon; push the dial, turning the background yellow. Then turn the dial again to set the humidity (1-6, default setting is 3). Push the dial once more to set the new humidity level.  For tube temperature: Turn the dial to highlight the thermometer icon; push the dial, turning that background yellow. Then turn the dial again to set the ClimateLine/ClimateLineAir tube temperature (60-86?F, default setting is 80?F). Push the dial once more to set the new temp.    When should I change the humidity vs. the temperature?  Most patients find cooler air easier to breathe while trying to sleep, especially those who are new to CPAP, wear a full face CPAP mask, and women experiencing hot flashes at bedtime. However, warmer air provides the best humidity and helps reduce nasal irritation since your nose doesn't have to warm all that CPAP air on its own. It generally helps to increase:  Humidity if dry air is causing you to wake up with dry mouth, an uncomfortable side effect that 40% of CPAP users experience.1 If you want more humidity, try manually adjusting it and the temperature one notch at a time. If you  reach the highest levels and still feel dry, talk to your equipment supplier and doctor about whether mask leak, oral medications or other factors may be the real cause of your dryness. (You should also check for mask leak if you wake up to find your humidifier's water chamber empty.)   Tube temperature if you're experiencing dryness despite increasing the humidity. Consider increasing your tube temperature by just 1-2? F to see if that provides the best comfort.  Source: resmed.com    Link to a video that shows you how to make adjustments to the humidity and tube temperature on the ResMed CPAP unit:       https://youtu.be/[email or web address]    Rules of Replacement     The life of CPAP supplies depends on the brand and style you buy, how well you care for them, and how often you use them. Follow cleaning instructions carefully to keep them looking and working their best.     Even if your CPAP supplies sparkle, it doesn't mean they'll last forever. Even with the best cleaning, parts can eventually grow bacteria and make you sick if they're not replaced regularly. That's why most masks and accessories follow some general rules for replacement. You also should check with your supplier for specific details about your mask.     Here are some general rules for replacing your CPAP supplies:    What: Air Filters  Why: Filters can wear out or become clogged over time, potentially exposing you to airborne particles and mold.  When: As soon as it becomes discolored or at least every 60 days.     * Most filters are disposable and are not meant to be cleaned, but discarded when dirty. If you are using an older device, you may have a non-disposable filter which should be cleaned as soon as it becomes discolored and replaced every 6-12 months.      * Keep a closer eye on your filter in the fall and winter -- they tend to get dustier when the heat is on and the windows are closed. If you live in a house with pets or smoke, you may need to  replace the filters more often.    What: Mask cushion/nasal pillow  Why: The material will soften with wear and eventually becomes too soft to hold a seal. This can lead to increased mask leaks which may disrupt your sleep and reduce the effectiveness of CPAP therapy.   When: Every 6-8 weeks. Consider replacing your cushion/pillow sooner if your straps need to be tightened to get the same mask seal.     What: Headgear  Why: The headgear straps lose elasticity over time and must be tightened more and more to achieve the same quality seal. Overtightening can cause facial sores and make treatment uncomfortable.   When: Every 6-12 months.    What: Hose/Tubing   Why: The hose may develop small holes or tears, which can cause air leaks. If your hose is leaking, you may not be receiving your prescribed therapy setting from your CPAP. This can cause you to feel like you're not sleeping as well.  When: About every 12 months.    * Signs of wear to your hose include dry, cracked places on the inside lining or on the rubber ends; "stretch marks" near the rubber ends; mineral deposits or mold from water left inside the hose; or a visible puncture or tear in the material.     What: Water Chamber  Why: Water chambers may become discolored, cracked, cloudy, or even pitted due to the mineral levels found in most tap and drinking water. As the material deteriorates, cracks may trap bacteria from moisture.  When: About every 12 months.     Who's got you covered?    Medicare and most private insurance plans cover the supplies recommended above. Ask your durable medical equipment supplier about your specific coverage. Some equipment suppliers use automatic systems for replacement that can save you extra work and worry. They'll check the type of supplies your plan replaces, how often they'll replace them, and then they'll ship them to you automatically. If your company doesn't have a   plan like this, see if they can email or call you with  a reminder to replace your supplies. If you have higher co-pays or pay out of pocket, ask your supplier about getting a mask with removable parts. It can be cheaper to replace single parts than having to replace the whole mask. By using clean, fresh, up-to-date CPAP supplies, you'll stay healthier, sleep deeper, and breathe easier.    Types of CPAP Masks    Adjusting to CPAP masks can take time. Often times, people have to try more than one style of mask before findings the perfect fit. There are three main types of masks: nasal pillows, nasal masks, and full face masks.    Nasal pillows rest at the entrance of the nostrils below the nose.  Pros:  Provide a feeling of openness and freedom, which helps you sleep more comfortably with fewer interruptions.   Keeps your field of vision clear--beneficial for those who wear glasses, or like reading or watching television before going to bed.   Causes less air leakage due to direct airflow into the nasal passages.   Great for patients with lots of facial hair who have trouble getting a seal with a standard CPAP mask that fits over the nose or nose and mouth.   Reduces the feeling of claustrophobia due to having minimal coverage over the face.  Cons:  Causes discomfort when used under higher-pressure due to direct airflow into nostrils.   May cause nosebleeds and dryness of the nose.   Patients with sensitive skin may develop an allergic reaction to the plastic.   Using ill-fitting nasal pillows could lead to pressure ulcers within the nostrils.  Nasal pillows are not the best option for mouth breathers    Nasal masks cover the nose and are lighter and smaller than full face masks, but provide better coverage than a nasal pillow.  Pros:  Works best if you mostly breathe through your nose only.   Nasal cushions create a good seal around your nose, while keeping a small profile. The masks also come with forehead support, which gives you stability and support for your mask.    Provide more natural airflow than nasal pillows, as the delivered pressure isn't as direct.   Better for higher-pressure settings than nasal pillows.   Offers flexible fit. There are many different styles cater to a wide range of facial structures and features.  Cons:  Nasal masks are not the best option for mouth breathers.   May cause red marks around the nose due to tightening the mask too much.   May cause extra discomfort if worn by those who have sinus issues that are allergy or cold-related.    Full face masks involve both the nose and mouth. Traditional full face masks are triangular shaped and cover both the nose and the mouth. A hybrid full face mask sits under the nose and covers the mouth. The hybrid style masks tend to be smaller and can be useful if you are experiencing claustrophobia but require a full face mask.   Pros:  Great for mouth breathers.   Great for those who suffer from frequent incidents of dry mouth from CPAP   Effectively prevents mask leakage from the mouth for those who sleep on their back.   Works best with patients who require higher CPAP pressure levels. Due to the larger surface area of the mask, higher pressures do not make you feel uncomfortable.   Ideal for some patients who   find CPAP masks to be claustrophobic because the full-face mask touches the outside of the face instead of the upper lip and or the bridge of the nose.   Cons:  Due to the mask's larger surface area, there is a higher chance of air leakage.   May cause dry, irritated eyes.   Difficult to watch TV, read or wear glasses while wearing the mask.   Not great for stomach sleepers due to the bulk of the mask.    * adapted from [employer] - Website      Top Ten Mask Issues and How to Solve Them      1. How do I get used to wearing a CPAP mask?  You need to take small steps to get accustomed to wearing your CPAP mask.  Try wearing the mask during the day when you're watching TV or  reading a book. Sometimes simply wearing the mask while you're cooking or even surfing the Internet can help you get used to wearing it at night.  Once you become accustomed to how the mask feels on your face, start wearing the CPAP mask everytime you sleep at night, and even during naps.  The reality is that the less you wear the mask, the harder it will be to get used to wearing it. So use the device for several weeks or more to see if the mask and pressure settings you were prescribed still work for you.    2. My CPAP mask is uncomfortable to wear at night!  When it comes to getting a new CPAP mask, it's important that you work closely with your doctor and CPAP supplier to make sure that the mask and device suit your needs and that it fits you properly.  Ask your provider, sleep technologist, or CPAP supplier to show you how to adjust your mask to get the best fit and read manufacturer product instructions, which can also help you get a better idea about proper fit.  The good news is that many mask styles are available. Schedule a time to meet with your CPAP supplier for a mask fitting to discuscs the styles available to you.    3. Am I allergic to my CPAP mask?  Here tips on how you can check whether you are allergic to your CPAP mask:  First, stop wearing the CPAP mask and immediately contact your physician. Usually an allergic reaction to a CPAP mask will occur the same night you wear it.   Ask yourself how frequently you clean your mask. Almost 9 out of 10 times, what appears to be an allergic reaction to a CPAP mask (such as a bruise on the face or a skin infection) is caused by infrequent cleaning of the mask.   Check whether your mask is an old version made with latex. The majority of the CPAP masks in the market today are made from silicone, and a few are made from some type of gel material. Almost all are latex free.    4. I can't tolerate the forced air from the CPAP mask.  You may be able to overcome  this issue by using the "ramp" feature on the CPAP machine.  The "ramp" feature allows you to start with low air pressure, which is then followed by an automatic, gradual increase that eventually sets itself to the pressure you were prescribed by your doctor. The rate of this "ramp" feature can be adjusted by your doctor as well.       5. My nose is running or stuffy after wearing the CPAP mask!  First, check if your CPAP device comes with a heated humidifier. Usually these symptoms can be alleviated by the use of humidifier. If your CPAP machine does not have one, consider getting one that allows you to adjust the level of humidification.  Consider using a nasal saline spray at bedtime to prevent your nose from overdrying. And finally, make sure that your mask is actually fitting well; a leaky mask can dry out your nose.     6. I feel claustrophobic when I'm wearing the CPAP mask.  Start off by having a positive attitude about your CPAP treatment.  You may not know it, but the CPAP machine and mask is there to improve the quality of your life; significantly in the long run.  Follow our advice on getting used to wearing your CPAP mask and above all, keep in mind that successful CPAP therapy sometimes requires patience as you adjust to therapy.   Practice wearing your CPAP mask while you're awake. Practice by first just holding the mask up to your face without any of the other parts attached. Once you're comfortable with that, try wearing the mask with the straps.   Take small steps to get used to the CPAP mask. Try holding the mask with the hose connected to your face, without using the straps. Have the hose attached to the CPAP machine at a low-pressure setting (with ramp feature turned on). And, finally, wear the mask with the straps and with the air pressure machine turned on while awake. After you're comfortable with that, try sleeping with it on.   Try relaxation exercises. Certain exercises, such as progressive  muscle relaxation, also may help reduce your anxiety about wearing your CPAP mask. It may help to get a different size mask or try a different style, such as one that uses nasal pillows.    7. I can't fall asleep easily with the CPAP mask on.  This is a normal, temporary problem that occurs most often with patients new to CPAP therapy. Follow our advice on getting used to your CPAP machine and try out the "ramp" feature of your machine.  Also make sure that you're practicing good sleep hygeine, which includes: exercising regularly and avoiding caffeine and alcohol before bedtime.    8. Why do I have dry mouth after wearing my CPAP mask?  If you breathe through your mouth at night or sleep with your mouth open, CPAP may worsen dry mouth. A chin strap may help keep your mouth closed and reduce the air leak if you wear a nasal mask. But once again, make sure that you're wearing the right kind of mask and try adjusting your CPAP machine's heated humidifier to see if that helps.     9. I keep on taking my CPAP mask off at night while sleeping.  It's normal to sometimes wake up to find that you've removed the mask in your sleep. If you move a lot in your sleep, you may find that a full face mask will stay on your face better. You may be pulling off the mask because your nose is congested. If so, ensuring a good mask fit and adding a heated humidifier to your CPAP machine may help. A chin strap also may help you to keep the device on your face.  If this is a consistent problem, consider setting an alarm for sometime in the night, to check whether the   device is still on. You could progressively set the alarm for later in the night if you find you're keeping the device on longer.    10. Why is the CPAP machine so loud?  The CPAP machine is most likely a lot quieter than your snoring, but if noise is a problem, you have several options.  Most new models of CPAP devices are almost silent. But if you find a device's noise is  bothersome, first check to make sure the device air filter is clean and unblocked. Something in its way may be contributing to noise.   If this doesn't help, have your doctor, sleep technologist, or CPAP supplier check the device to ensure it's working properly. If the device is working correctly and the noise still bothers you, try wearing earplugs or getting a fan to turn on at night to make"white noise" that can also help to disguise the noise of the CPAP machine.      * adapted from [employer] - Website    [facility]:    Information about Drowsy Driving    Drowsy driving is a common - and often deadly - danger on our roads. It occurs when you are too tired to remain alert while operating a motor vehicle. As a result you may struggle to stay focused on the road and fail to practice safe driving techniques.     Feeling drowsy makes you easily distracted. You may be unable to focus and pay attention. This can lead to increased errors and a decreased ability to detect and correct mistakes. Your thinking can be slowed and your response time delayed. People who are sleepy also tend to be unaware that their performance and alertness are suffering.     Drowsy driving can be as deadly as drunk driving, putting yourself, your passengers and other drivers in danger. Common mistakes while driving drowsy include:  Following too closely behind the vehicle in front of you   Failing to realize that you are driving too fast   Drifting into another lane or off the road without knowing it   Falling asleep behind the wheel and losing control of your vehicle     Accidents caused by drowsy driving tend to share some common features. These include:  Time of Day: Accidents often occur late at night or early in the morning during the body's natural sleep period. Accidents also can occur when sleepiness peaks after lunch in the middle of the afternoon.  Speed: Accidents often occur at high  speeds on highways and other major roadways.  Solitude: Accidents often involve one vehicle that veers off the road. Drivers often are alone in the vehicle.  No Brakes: Drivers often make no attempt to apply the brakes or avoid the crash.  Severity: Accidents often cause severe injuries or death.    Anyone who doesn't get enough sleep can drive drowsy. But certain people have a higher risk than others, including people who:  Take medications that cause sleepiness   Drink alcohol, which increases drowsiness   Work night shifts or rotating shifts   Have a sleep disorder     Do you best to avoid drowsy driving. Rolling down the windows or turning up the volume on the radio will do little to increase your alertness while driving. These are some better ways to avoid drowsy driving:  Get a full night of seven to eight hours of sleep before driving  Avoid driving late at   night  Avoid driving alone  On a long trip, share the driving with another passenger  When the signs of fatigue begin to show, get off the road. Take a short nap in a well-lit area. Do not simply stop on the side of the road.   Use caffeine for a short-term boost  Take a short nap after consuming caffeine to maximize the effect  Arrange for someone to give you a ride home after working a late shift    Drowsy Driving Statistics    Unfortunately, drowsy driving occurs all too often. The [place] National Highway Traffic Safety Administration (NHTSA) reports that drowsy driving causes more than 100,000 crashes a year. These accidents result in an estimated 40,000 injuries and 1,550 deaths.    In reality, all of these numbers are probably much higher than the statistics show. It is difficult to know how drowsy someone was prior to an accident. Unlike drunk driving, there is no "breathalyzer" test for drowsiness. So unless a driver admits falling asleep, drowsy driving often goes unreported.   In 2002 [employer] surveyed more than 4,000 drivers in the  [place] Survey results show that 37 percent of drivers reported nodding off or falling asleep at least once while driving. Males were almost twice as likely as females to report that they had driven drowsy. [employer] estimated that 7.5 million drivers had nodded off while driving in the past month.    The only sure way to detect drowsy driving would be to monitor the alertness of people as they drive. That's exactly what the [employer] did in a naturalistic driving study. They installed video cameras and sensors in 100 vehicles that were driven by ordinary people every day for one year. The study gathered nearly 2 million miles of real-world driving data. It found that driver drowsiness was a contributing factor in at least 20 percent of all crashes. The researchers estimated that you are four to six times more likely to have a crash or near crash when you are driving drowsy.
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
## 2024-05-07 NOTE — Progress Notes (Signed)
 I met with [name] after his follow up with [name] and fit him with a large Airtouch F20 full face mask. I had him try the medium size first, but he said that he used the mask before and it was a large. He did not bring his CPAP with him today, so I was unable to check for air leaks with the CPAP pressure. I told him that all cushion sizes fit the same mask and that he can try the medium if he feels air leaks with the large.     [name] said that he has not been contacted by [facility] for supplies, and has been using the same mask for approximately one year. I sent a message to [name] to let him know he needs supplies, including a new Climate tube.    I encouraged [name] to call or send a MyChart message if he has any questions or concerns prior to his next visit, and to bring his CPAP with him for future visits.    I received the following message from [name] at [facility] in response to me e-mail.    Hey [name]!  I'll have him contacted for a new resupply!  It looks like that was the original mask he had been using and then he exchanged to an N20 in June of last year but I'll mark his chart that he's back to the Airtouch F20 L now!   As for the tubing, he's an [employer] Medicaid patient so he wouldn't be eligible for the heated tube unless he can pick one up from one of our locations!  I'll have him contacted ASAP!  [name]

## 2024-05-18 ENCOUNTER — Other Ambulatory Visit: Payer: Self-pay

## 2024-05-19 ENCOUNTER — Ambulatory Visit: Admitting: Internal Medicine

## 2024-05-19 ENCOUNTER — Ambulatory Visit: Payer: Self-pay | Admitting: Internal Medicine

## 2024-05-19 ENCOUNTER — Encounter: Payer: Self-pay | Admitting: Internal Medicine

## 2024-05-19 ENCOUNTER — Other Ambulatory Visit: Payer: Self-pay

## 2024-05-19 VITALS — BP 143/93 | HR 69

## 2024-05-19 DIAGNOSIS — J302 Other seasonal allergic rhinitis: Secondary | ICD-10-CM

## 2024-05-19 DIAGNOSIS — K219 Gastro-esophageal reflux disease without esophagitis: Secondary | ICD-10-CM

## 2024-05-19 DIAGNOSIS — I10 Essential (primary) hypertension: Secondary | ICD-10-CM | POA: Insufficient documentation

## 2024-05-19 MED ORDER — FLUTICASONE PROPIONATE 50 MCG/ACT NA SUSP *I*
1.0000 | Freq: Every day | NASAL | 6 refills | Status: AC
Start: 2024-05-19 — End: ?

## 2024-05-19 MED ORDER — LORATADINE 10 MG PO TABS *I*
10.0000 mg | ORAL_TABLET | Freq: Every day | ORAL | 3 refills | Status: AC
Start: 2024-05-19 — End: ?

## 2024-05-19 MED ORDER — AZELASTINE HCL 0.05 % OP SOLN *I*
1.0000 [drp] | Freq: Two times a day (BID) | OPHTHALMIC | 5 refills | Status: DC
Start: 2024-05-19 — End: 2024-08-20

## 2024-05-19 MED ORDER — OMEPRAZOLE 40 MG PO CPDR *I*
40.0000 mg | DELAYED_RELEASE_CAPSULE | Freq: Every day | ORAL | 5 refills | Status: DC
Start: 2024-05-19 — End: 2024-08-20
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
## 2024-05-19 NOTE — Progress Notes (Signed)
 [facility] - Office Visit    SUBJECTIVE    CHIEF COMPLAINT:   Chief Complaint   Patient presents with    Follow-up     Problems reviewed at this visit were:  1. Essential hypertension    2. Seasonal allergies    3. Gastroesophageal reflux disease without esophagitis      History of Present Illness  HTN  Recent high BPs at office visits  Not on any medication  Not checking home BPs, does not have a cuff  Would like me to send rx for BP cuff  Willing to try amlodipine     Seasonal allergies  Needs refills of allergy medications  Also requests claritin  rx     GERD  Having a lot of this recently  No dietary changes, not sure why  Would like to resume omeprazole  which previously worked    OBJECTIVE  BP (!) 143/93 (BP Location: Left arm, Patient Position: Sitting, Cuff Size: large adult)   Pulse 69   BP Readings from Last 3 Encounters:   05/19/24 (!) 143/93   05/19/24 (!) 143/93   05/07/24 137/90     Vitals: reviewed.  Gen: alert and appropriately conversant.  Assessment & Plan  1. HTN. Above goal of < 130/80. Start amlodipine 5mg  daily.    2. Seasonal allergies. Refills as below.    3. GERD. Symptoms worse recently. Resume omeprazole .     1. Essential hypertension        2. Seasonal allergies  azelastine  (OPTIVAR ) 0.05 % ophthalmic solution    fluticasone  (FLONASE ) 50 MCG/ACT nasal spray    loratadine  (CLARITIN ) 10 mg tablet      3. Gastroesophageal reflux disease without esophagitis  omeprazole  (PRILOSEC) 40 mg capsule        Follow up in about 3 months (around 08/19/2024) for high blood pressure.    Evalene Care, MD MPH

## 2024-07-01 ENCOUNTER — Telehealth: Payer: Self-pay

## 2024-07-01 NOTE — Telephone Encounter (Signed)
 Copied from CRM #[id number]. Topic: Access to Care - Refer to Specialty or Service>> Jul 01, 2024  4:27 PM Tillman DASEN wrote:[facility] - Telephone Request for [facility] Source:  Who is the caller? PatientContact information if different from the patient: n/aPatient Information:Patient's Name:  [name] Patient's Date of Birth: 10-18-1986 Preferred Phone:  [phone] Address: [address] [place] [address] County: [id number]Demographics Verified/Updated?Jorja messages regarding your mental health services be left at this number?YesPreferred Language: English Did someone interpret for the patient during the call? No.Would you need an interpreter for your visits? NoInsurance Information:Insurance to be billed for services: [place] MedicaidPolicy #: [id number] Will care be covered by elective self-pay, WC, MVA, VA Benefits, or any other 3rd party coverage?NODo you have current mental health providers: NoneWhat SERVICE(s) are you hoping to be connected to?Individual PsychotherapyWhat SYMPTOMS or concerns are you experiencing that prompted you to seek care?    (Provide a brief description of the reason the patient/referring provider is seeking treatment. Use patients own words where possible.)Depression, anxietyDo you currently feel safe from harming yourself or others? Yes

## 2024-07-01 NOTE — Telephone Encounter (Signed)
 Referral generated and routed to [facility] for outreach.

## 2024-07-02 ENCOUNTER — Telehealth: Payer: Self-pay

## 2024-07-02 NOTE — Telephone Encounter (Signed)
 Patient: [name], PACHOLSKI [[id number]] Referral #: [id number] Status: New Request Type: Psychiatric Class: Incoming Reason(s):   Diagnosis: F41.9,F32.A (ICD-10-CM) - Anxiety and depression Procedure: [id number] - AMB REFERRAL TO PSYCHIATRY Start:   Expiration:   Requested: 1 Authorized: 1 Scheduled:   Completed:   Referring Location:   Referred to Location:   Referring Department: [facility] Referred To Department: [facility] ADULT     Referred to Dept Phone Number: [phone] [facility] Telephone Call Date of call: 8/21/2025Name: Jemaine Prokop Pronouns:  he/him/his DOB: Oct 16, 1986 MRN: [id number] [phone] T/C to patient at [phone] in regard to obtaining additional information regarding patients needsOutcome:Voice message was left requesting return call. If Pt contacts [facility] please ask them to contact the referral coordinator directly at [phone].   A letter and MyChart message was sent to the patient requesting a return call. If no response by 07/17/24, referral will be closed. Referral will be re-authorized if patient returns call within 90 days. Referral remains valid until 09/30/2024.
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
## 2024-07-15 ENCOUNTER — Other Ambulatory Visit: Payer: Self-pay

## 2024-07-15 ENCOUNTER — Ambulatory Visit: Payer: Self-pay | Attending: Psychiatry

## 2024-07-15 DIAGNOSIS — F4310 Post-traumatic stress disorder, unspecified: Secondary | ICD-10-CM | POA: Insufficient documentation

## 2024-07-15 DIAGNOSIS — F430 Acute stress reaction: Secondary | ICD-10-CM | POA: Insufficient documentation

## 2024-07-15 DIAGNOSIS — F419 Anxiety disorder, unspecified: Secondary | ICD-10-CM | POA: Insufficient documentation
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
## 2024-08-13 ENCOUNTER — Other Ambulatory Visit: Payer: Self-pay | Admitting: Internal Medicine

## 2024-08-13 MED ORDER — ALBUTEROL SULFATE HFA 108 (90 BASE) MCG/ACT IN AERS *I*: 1.0000 | INHALATION_SPRAY | RESPIRATORY_TRACT | 5 refills | Status: DC | PRN

## 2024-08-20 ENCOUNTER — Ambulatory Visit: Payer: Self-pay | Admitting: Psychiatry

## 2024-08-20 ENCOUNTER — Other Ambulatory Visit: Payer: Self-pay | Admitting: Internal Medicine

## 2024-08-20 ENCOUNTER — Other Ambulatory Visit: Payer: Self-pay

## 2024-08-20 ENCOUNTER — Ambulatory Visit: Payer: Self-pay | Attending: Psychiatry

## 2024-08-20 VITALS — BP 143/106 | HR 73 | Ht 70.98 in | Wt 218.0 lb

## 2024-08-20 DIAGNOSIS — F430 Acute stress reaction: Secondary | ICD-10-CM | POA: Insufficient documentation

## 2024-08-20 DIAGNOSIS — F32A Depression, unspecified: Secondary | ICD-10-CM

## 2024-08-20 DIAGNOSIS — Z7189 Other specified counseling: Secondary | ICD-10-CM

## 2024-08-20 DIAGNOSIS — F4310 Post-traumatic stress disorder, unspecified: Secondary | ICD-10-CM | POA: Insufficient documentation

## 2024-08-20 DIAGNOSIS — F419 Anxiety disorder, unspecified: Secondary | ICD-10-CM

## 2024-08-20 DIAGNOSIS — F1010 Alcohol abuse, uncomplicated: Secondary | ICD-10-CM | POA: Insufficient documentation

## 2024-08-20 DIAGNOSIS — J302 Other seasonal allergic rhinitis: Secondary | ICD-10-CM

## 2024-08-20 DIAGNOSIS — F1120 Opioid dependence, uncomplicated: Secondary | ICD-10-CM | POA: Insufficient documentation

## 2024-08-20 DIAGNOSIS — F331 Major depressive disorder, recurrent, moderate: Secondary | ICD-10-CM | POA: Insufficient documentation

## 2024-08-20 DIAGNOSIS — K219 Gastro-esophageal reflux disease without esophagitis: Secondary | ICD-10-CM
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
## 2024-08-20 NOTE — Progress Notes (Signed)
 Primary Care Screen - Adult Length of session: 20 min10/9/2025Patient Name:  [name] Date of BIrth:  July 04, 1987Patient Medical Record Number:  [id number] Patient Phone (Home):  [phone] (home) Patient Mobile Phone:  Telephone Information: Mobile [phone] Patient Address:  [address] [place] [address]Health Screen:Visit VitalsBP (!) 143/106 (BP Location: Left arm, Patient Position: Sitting, Cuff Size: adult) Comment: teaching done, patient declined retake Pulse 73 Ht 1.803 m (5' 10.98) Wt 98.9 kg (218 lb) BMI 30.42 kg/m Name of Primary Care Provider:  Versa Lye, MDDate of Last Physical Exam:  (not sure of date/year)Physical exam is within last 12 months?: NoPatient is actively engaged with PCP of record?: YesPast Medical History:Diagnosis Date  Allergy   Anxiety   Asthma   Depression   Pre-diabetes  Past Surgical History:Procedure Laterality Date  PR DBRDMT FX&/DISLC SUBQ T/M/F BONE Left 01/25/2020  Procedure: DEBRIDEMENT, OPEN FRACTURE DISLOCATION, INDEX FINGER;  Surgeon: Suann Lowers, MD;  Location: [facility];  Service: Orthopedics  PR NEUROPLASTY DIGITAL 1/BOTH SAME DIGIT Left 01/25/2020  Procedure: EXPLORATION TENDON/NERVE, HAND;  Surgeon: Suann Lowers, MD;  Location: [facility];  Service: Orthopedics  PR OPEN TX PHALANGEAL SHAFT FRACTURE PROX/MIDDLE EA Left 01/25/2020  Procedure: ORIF, INDEX FINGER;  Surgeon: Suann Lowers, MD;  Location: [facility];  Service: Orthopedics  PR OSTEOTOMY PHALANX FINGER EACH Left 03/15/2021  Procedure: OSTEOTOMY, HAND;  Surgeon: Romelle Nurse, MD;  Location: [facility];  Service: Orthopedics Medications/Vitamins/Supplements       Last Dose Start Date End Date Provider   acetaminophen  (TYLENOL ) 500 mg tablet Not Taking  03/16/22  --  Raiford, Eloisa Norris, MD   Take 2 tablets (1,000 mg total) by mouth 3 times daily    Patient not taking: Reported on 08/20/2024   albuterol  HFA (PROVENTIL , VENTOLIN ) 108 (90 Base) MCG/ACT inhaler As Needed  08/13/24  --  Versa Lye, MD   Inhale 1-2 puffs into the lungs every 4-6 hours as needed for Wheezing.   atorvastatin  (LIPITOR) 20 mg tablet Taking  07/31/23  08/20/24  Versa Lye, MD   Take 1 tablet (20 mg total) by mouth daily.   azelastine  (OPTIVAR ) 0.05 % ophthalmic solution As Needed  05/19/24  --  Versa Lye, MD   Place 1 drop into both eyes 2 times daily.   CPAP machine ([facility] [phone]) Taking  02/15/23  --  Cleone, Barton Solar, MD   [employer] CPAP 5-18 cmH2O with pressure relief & all needed supplies. Duration: Lifetime   fluticasone  (FLONASE ) 50 MCG/ACT nasal spray As Needed  05/19/24  --  Versa Lye, MD   Spray 1 spray into nostril daily.   loratadine  (CLARITIN ) 10 mg tablet As Needed  05/19/24  --  Versa Lye, MD   Take 1 tablet (10 mg total) by mouth daily.   metFORMIN  (GLUCOPHAGE -XR) 500 mg 24 hr tablet Taking  07/31/23  --  Versa Lye, MD   Take 1 tablet (500 mg total) by mouth daily (with dinner). Swallow whole. Do not crush, break, or chew.   omeprazole  (PRILOSEC) 40 mg capsule Taking  05/19/24  --  Versa Lye, MD   Take 1 capsule (40 mg total) by mouth daily.   PAP supplies As Needed  01/29/24  --  Cedric Nest, PA   Perform a mask fitting & provide all needed PAP replacement supplies. Duration: LifetimeReports the mask does not stay on all night, straps seem wrong and the tube pops off the mask as well.    Allergy History as of 08/20/24   ENVIRONMENTAL  ALLERGIES     Noted Status Severity Type Reaction  03/08/20 1150 Serene Domino, RN 03/08/20 Active  Allergy Rhinitis     NutritionDo you have any food allergies?: No Have you experienced an increase or decrease in food intake and/or appetite in the last three months?: No Have you had a weight  loss or gain of 10 pounds or more in the past three months?: No Do you have eating habits or behaviors that may be indicators of an eating disorder, such as bingeing or inducing vomiting?: NoDo you feel you need to speak with someone regarding your eating patterns?: NoDo you have any dental problems impacting your ability to eat?: Yes (secondary to wisdom teeth extraction)Nutritional Concerns: Patient has no nutritional concerns. No follow-up necessary.Estimated body mass index is 30.42 kg/m as calculated from the following:  Height as of this encounter: 1.803 m (5' 10.98).  Weight as of this encounter: 98.9 kg (218 lb).BMI outside normal limits, recommend follow up with PCP.  Which, if any, does the patient say describes him?NoneSocial HistoryTobacco Use Smoking Status Every Day  Types: Vaping Smokeless Tobacco Never Does the patient use tobacco products?Yes,  Patient declines intervention at this timePain Assessment:Do you have any ongoing pain problems?: [place] is your pain located? #1: LegWhere is the pain oriented? #1: RightHow often do you experience the pain? #1: ContinuousWhat does the pain feel like? #1: AchingPain Scale (0-10) #1: 7Pain is adequately controlled? #1: NoHow is the pain being treated? #1: Other (Tylenol  and Ibuprofen  as needed)    Fall Risk Screen:Have you fallen in the last year?: YesDid you sustain an injury which required medical attention?: YesWhat level of medical attention?: EDDo you feel you are at risk of falling?: YesDo you feel your risk of falling is: : ModerateWould you like to learn more about how to reduce your risk of falling?: YesInfectious Disease Screen:Have you ever been tested for Hepatitis?: YesDid you test positive for Hepatitis?: NoHave you ever tested positive for tuberculosis?: No Have you been tested for HIV?: YesWhen were you tested?:  (may  2023)Were the results: : NegativeAfter review of documentation and assessment during session, patient appears to be free of serious communicable disease that can be transmitted through ordinary contact with others. Patient indicated that they understood the above information and was advised that regardless of the above findings, they should communicate their current condition with their PCP: yesRecommended Health Services:- Based on the above Primary Care Screening, patient is in agreement with the recommendations for further evaluation.- Based upon clinical review and on patient interview it is determined that the patient has not had a physical exam within the past 12 months.  It has been recommended that the patient schedule a physical exam with their PCP.  Safety/Fall precautions teaching done.  Educated on blood pressure reading.  Encouraged exercises e.g., walking (per patient - increased stress).

## 2024-08-21 ENCOUNTER — Encounter: Payer: Self-pay | Admitting: Internal Medicine

## 2024-08-24 ENCOUNTER — Other Ambulatory Visit: Payer: Self-pay

## 2024-08-24 ENCOUNTER — Ambulatory Visit: Attending: Psychiatry

## 2024-08-24 DIAGNOSIS — F1190 Opioid use, unspecified, uncomplicated: Secondary | ICD-10-CM | POA: Insufficient documentation

## 2024-08-24 DIAGNOSIS — F331 Major depressive disorder, recurrent, moderate: Secondary | ICD-10-CM | POA: Insufficient documentation

## 2024-08-24 DIAGNOSIS — F4310 Post-traumatic stress disorder, unspecified: Secondary | ICD-10-CM | POA: Insufficient documentation

## 2024-08-24 DIAGNOSIS — F1090 Alcohol use, unspecified, uncomplicated: Secondary | ICD-10-CM | POA: Insufficient documentation
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
## 2024-08-26 ENCOUNTER — Encounter: Payer: Self-pay | Admitting: Psychiatry

## 2024-08-26 NOTE — NoShare Progress Note (Signed)
 Clinical Management Note [id number] [name] 08/26/24 Case was presented in: Treatment TeamReason: Clinical ComplexityContinuity of CarePotential transfer to another provider/clinical service Psychosocial needsLengthy history/clinical summaryStrong Partial evaluation -additional information & dx opioid use disorder, alcohol use disorder Additional information: -Patient has more severe SUD needs than current clinic can assist with. Needs CD OASAS provider. Outcome: -Recommended referral to [facility] to address SUD, first-stabilization; ideally all in one, as he also wants family/relationship therapy and mental health.

## 2024-08-31 ENCOUNTER — Other Ambulatory Visit: Payer: Self-pay

## 2024-09-01 ENCOUNTER — Ambulatory Visit: Payer: Self-pay | Admitting: Psychiatry

## 2024-09-01 DIAGNOSIS — F331 Major depressive disorder, recurrent, moderate: Secondary | ICD-10-CM

## 2024-09-01 DIAGNOSIS — F4310 Post-traumatic stress disorder, unspecified: Secondary | ICD-10-CM

## 2024-09-01 DIAGNOSIS — F1010 Alcohol abuse, uncomplicated: Secondary | ICD-10-CM

## 2024-09-01 DIAGNOSIS — F1120 Opioid dependence, uncomplicated: Secondary | ICD-10-CM
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
## 2024-09-02 ENCOUNTER — Encounter: Payer: Self-pay | Admitting: Psychiatry

## 2024-09-04 ENCOUNTER — Telehealth: Payer: Self-pay

## 2024-09-04 NOTE — Telephone Encounter (Signed)
 This clinical research associate received a referral from [employer] and reached out to [place] for a phone screen. He did not answer, and a message was left with this writer's direct phone number.

## 2024-09-04 NOTE — NoShare Progress Note (Signed)
 Clinical Management Note [id number] [name] 09/04/24 Case was presented in: Multidisciplinary leadership meeting between [facility] and [facility]Staff Present:Sally Roxanne Vernell Molt- [facility]Karen Hospers, Burnard Fudge- [facility]Reason: [facility] completed evaluation and is recommending connection to Chemical Dependency services. Outcome: [facility] will outreach Pt to offer Suboxone stabilization (occurs on Mondays) and Pt can be seen soon after for evaluation for potential transfer of care, if appropriate and Pt is willing to engage. There is a wait to see embedded mental health therapist at [facility]. [facility] therapist will continue to see patient during evaluation and transfer process and [facility] will communicate outcome to therapist via inbasket message to ensure a smooth transition. Other information discussed:Pt is seen at [facility] for [facility] care. [facility] offers medication consultation and has a buprenorphine program that may be resources to the Pt in the future.

## 2024-09-04 NOTE — Telephone Encounter (Signed)
 Writer attempted an outreach to conduct a phone screen and schedule an intake appointment. There was no answer and a non-identifying message was left. This was attempt number 1.

## 2024-09-07 ENCOUNTER — Ambulatory Visit: Payer: Self-pay | Admitting: Sleep Medicine

## 2024-09-07 ENCOUNTER — Telehealth: Payer: Self-pay

## 2024-09-07 NOTE — Telephone Encounter (Addendum)
 [facility] Telephone Intake Screen Patient's language: English Patient Information:Patient's Name: [name] Date of Birth: Feb 11, 1987Patient's Medical Record Number: [id number] Patient's Home Phone: [phone] (home)Patient's Work Phone: There is no work phone number on file.Patient's Mobile Phone: Telephone Information: Mobile [phone] Patient's preferred number: [phone]'s Marital Status (if applicable): Life PartnerCan a message be left? Not knownInsurance Information:MVP MedicaidPolicy number: [id number] Referral Source:Referral Source: Other - specify: [facility]/David Fortuna, LMHC-D, NCC, CCTP-II Presenting Concern: What is the reason you are seeking care?   (Please provide a brief description of the reasons the patient or referring provider is seeking mental health treatment at this time.  Please use patients own words where possible).This clinical research associate returned [name]'s call. He said that he was evaluated by the [facility] Hospitalization Program and was recommended substance use treatment. He was then seen at [facility] by [name], who suggested [facility]. [name] reports abusing pain killers. He has used non-prescribed oxycodone  30-60 mg per day since 2021. The only time he stopped using it was during a 68-month period of incarceration from May to November 2023. He did not receive medication for opioid use disorder at the time and experienced withdrawal symptoms of headaches, nausea and irritability. He has been using the pills more heavily since his sister died two weeks ago, 30 pills in one day. Prior to that, he used 1-2 pills per day.The following was taken from a progress note from 09/01/24 with Dennis Dye the substance in larger amounts or for longerthan you meant to.    Opiates, Alcohol-Cravings and urges to use the substance.  Opiates. -Continuing to use, even when it  causes problems in relationships.  Opiates, Alcohol-Continuing to use, even when you know you have a physical orpsychological problem that could have been caused or made worseby the substance.  Opiates, Alcohol -Development of withdrawal symptoms, which can be relieved bytaking more of the substance.  Opiates Torrion told this clinical research associate that his opioid use gets in the way. It causes him to lash out at his wife when he is experiencing withdrawal, to the point where she wants him to leave.[place] expressed an interest in medication for opioid use disorder, and this clinical research associate talked with him about Suboxone and [facility] Stabilization Program. The next opening would be Monday, 09/14/24 at 11:30 a.m., and he could expect to be here for three hours. I told him that if he did not want to wait until 11/3, he could got to [facility] outpatient same-day Suboxone access program this week. He said that he works and would want to give notice to his employer, so he preferred to wait until 11/3. He also said that because of his job, he could not attend inpatient detox.This clinical research associate called [name] back after hearing from the Stabilization provider and asked if he would be able to attend an intake at [facility] on Wednesday, 09/09/24, at 12:30, prior to starting Stabilization on 11/3. He said he could do that. He would need to leave by 2:30 because he works in [place] at 3:00.                              Mental Health History:Are you currently involved in mental health treatment?No  What is your understanding of your mental health diagnosis? PTSD, depressionWhat mental health medication are you prescribed, if any? He was prescribed Prozac  but hasn't been taking it. Are you currently taking a long acting injectable medication?NoWill you need an injection at the time  of your first intake assessment appointment? noTreatment/Substance Use History:Audit-C(If total  score is 3 or more complete substance abuse assessment or full Audit in Flowsheets)1. How often do you have a drink containing alcohol? 3: 2 to 3 times a WEEK2. How many drinks containing alcohol do you have on a typical day when you are drinking? 4: 10 or more drinks3. How often do you have six or more drinks on one occasion? 3: WeeklyScore: 10He reports drinking a fifth of liquor on weekends and does not experience withdrawal symptoms during the week when he does not drink. He reported to [name] that his last use of alcohol was on 08/08/24.Current Other Substance use: no, if yes, what substance n/aCurrently in Chemical Dependency treatment? No  Legal System Involvement:Any current involvement with legal system? No Legal contact name: n/a     Upcoming court/appearance date? No  Have you ever been or are you in the process of being charged with a sexual offense? NoRisk Assessment:Self Injury: Patient DeniesSuicidal Ideation: Patient DeniesRecent suicide attempts: No  Homicidal Ideation: Patient DeniesAggressive Behavior: Yes. Describe: aggressive towards his wife-we have gotten physicalIf yes, plan: Other: n/a, currently being seen by [facility]sCustomer Service:Interpreter Services:No Working Diagnosis:N/aIntake Scheduling: Stabilization ServicesDate: 09/08/24 12:30 Intake with KevinDate: 11/3/25Time: 11:30 a.m. Suboxone with Dr. Casimir, 12:00 p.m. Suboxone Education and PCS with MaysLENGTH OF SESSION: 15 minutesKaren Leola Fiore, CASAC

## 2024-09-08 ENCOUNTER — Encounter: Payer: Self-pay | Admitting: Internal Medicine

## 2024-09-08 ENCOUNTER — Ambulatory Visit: Payer: Self-pay | Admitting: Psychiatry

## 2024-09-08 ENCOUNTER — Ambulatory Visit: Payer: Self-pay | Admitting: Internal Medicine

## 2024-09-08 ENCOUNTER — Ambulatory Visit: Payer: Self-pay

## 2024-09-08 ENCOUNTER — Encounter: Payer: Self-pay | Admitting: Psychiatry

## 2024-09-08 ENCOUNTER — Other Ambulatory Visit: Payer: Self-pay

## 2024-09-09 ENCOUNTER — Encounter: Payer: Self-pay | Admitting: Mental Health

## 2024-09-09 ENCOUNTER — Encounter: Payer: Self-pay | Admitting: Gastroenterology

## 2024-09-09 ENCOUNTER — Ambulatory Visit: Payer: Self-pay | Attending: Psychology | Admitting: Mental Health

## 2024-09-09 DIAGNOSIS — F1420 Cocaine dependence, uncomplicated: Secondary | ICD-10-CM | POA: Insufficient documentation

## 2024-09-09 DIAGNOSIS — F17200 Nicotine dependence, unspecified, uncomplicated: Secondary | ICD-10-CM | POA: Insufficient documentation

## 2024-09-09 DIAGNOSIS — F1120 Opioid dependence, uncomplicated: Secondary | ICD-10-CM | POA: Insufficient documentation

## 2024-09-09 DIAGNOSIS — Z1389 Encounter for screening for other disorder: Secondary | ICD-10-CM | POA: Insufficient documentation

## 2024-09-09 DIAGNOSIS — F1020 Alcohol dependence, uncomplicated: Secondary | ICD-10-CM | POA: Insufficient documentation

## 2024-09-09 NOTE — Progress Notes (Signed)
 Intake -Stabilization Team MeetingOctober 28, 2025 Present: Annabelle Bunnell, MSN, PMHNP-BC; Clotilda Mash, BS, CASAC; Lauren Smith-Friedman, LMSW-CASAC; Janine Coppini, [facility], NCC, CASAC; [employer], LCSW-R, CASAC; [name], MHC, NCC, CASAC-T; Darice Dooms, MS, CASAC; Rollene Slim, RN; Kelly Rinks, MS, CASAC; Leone Salon, PA; Jade Malcho, MD; Chalmer Ashworth, RN; Marylynn Nine, BS;  Guthrie Lemme [id number] ALDTerrence was referred by [name]. He referred himself for an intake there on 07/15/24 and was then evaluated at [facility] on 08/24/24. He was diagnosed with major depressive disorder, PTSD, alcohol use disorder and opioid use disorder and did not engage due to traveling to [place]  for his sister's funeral. He was then referred to [facility] and was given an 09/14/24 start date in Stabilization. In the meantime, he agreed to attend an intake tomorrow with [name]. He reported using 30-60 mg of oxycodone  per day since 2021, and increased amounts (30 pills per day) since the death of his sister two weeks ago. He attended substance use disorders treatment in 2019 and has never tried medication for opioid use disorder.
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
## 2024-09-09 NOTE — BH Crisis Intervention (Signed)
 [place] Safety Plan  Creation Date: 07/15/24 Last Update Date: 09/09/24  Step 1: Warning signs:  Warning Signs  Heart beating extremely fast  Sweaty palms  Extreme Isolation  rocking  feel like I'm about to explode  Step 2: Internal coping strategies - Things I can do to take my mind off my problems without contacting another person:  Strategies  Sit in the shower  Distress tolerance skills - distracts & improve  avoid alcohol and other use -consider rocovery, aa  riding bike  CBT ABC -reframing  start workout  Radical acceptance and values based actions  Step 3: People and social settings that provide distraction:  Name Contact Information  Bart Mullins-brother [phone]   Places  Outside  Step 4: People whom I can ask for help during a crisis:  Name Contact Information  Bart Mullins-brother [phone]  Step 5: Professionals or agencies I can contact during a crisis:  Clinician/Agency Name Phone Emergency Contact  [facility] Crisis Call Line [phone]   [facility] [phone] [address] Entrance at [address] [place], [place] [address]  [name], LMHC-D (Therapist at [facility]) [phone]   Rozanna Lamb, MD (Psychiatrist at [facility]) [address] [facility], [place] [phone]  Local Emergency Department Emergency Department Address Emergency Department Phone  [facility] [address], [place], [place] [address] [phone]  Suicide Prevention Lifeline Phone: Call or Text 973 449 9285 Text Line: Text HOME to (772)631-5950 Step 6: Making the environment safer (plan for lethal means safety): Has access to Narcan  Optional: What is most important to me and worth living for?: My kids and my wife  Careers Adviser Plan. [employer] and Cordella POUR. Delores. Used with permission of the authors. Local agencies I can  contact during a crisis:Walk in hours [place] Monday through Friday: 8 AM to 12 PM and 1 PM to 4 PMWalk in hours [facility]- Monday through Friday: 1 PM to 4 PMN[place] On-call Service [phone] (6pm to 8am Monday - Friday, 24 hours weekends/holidaysPeer support: [phone] [facility] Warm Line 211Police/Emergency: 911

## 2024-09-10 ENCOUNTER — Encounter: Payer: Self-pay | Admitting: Sleep Medicine

## 2024-09-10 LAB — STRONG RECOVERY OUTPATIENT PANEL, URINE (DRUGSCAN)
Amphetamines, Urine: NEGATIVE ng/mL
Benzodiazepines, Urine: NEGATIVE ng/mL
Buprenorphine, Urine: NEGATIVE ng/mL
Cocaine Metabolites, Urine: NEGATIVE ng/mL
Creatinine, Urine: 209.6 mg/dL (ref >20.00)
ETG/ETS, Urine: NEGATIVE ng/mL
Fentanyl, Urine: NEGATIVE ng/mL
Marijuana Metabolite, Urine: NEGATIVE ng/mL
Nitrite, Urine: 31.8 ug/mL (ref <200)
Opiates - Basic, Urine: NEGATIVE ng/mL
Oxycodone/Oxymorphone, Urine: NEGATIVE ng/mL
pH, Urine: 6.2 (ref 4.50–9.00)

## 2024-09-11 ENCOUNTER — Telehealth: Payer: Self-pay | Admitting: Internal Medicine

## 2024-09-11 DIAGNOSIS — I10 Essential (primary) hypertension: Secondary | ICD-10-CM

## 2024-09-11 MED ORDER — BLOOD PRESSURE MONITOR/ARM DEVI *A*: 0 refills | Status: DC

## 2024-09-11 NOTE — Telephone Encounter (Signed)
 1. Essential hypertension (Primary)Rx sent to pharmacy.- blood pressure monitor, automatic with arm cuff; Use as directed to monitor blood pressure  Dispense: 1 kit; Refill: 0

## 2024-09-11 NOTE — Telephone Encounter (Signed)
 Copied from CRM [id number]. Topic: Access to Care - Speak to Provider/Office Staff>> Sep 11, 2024 10:07 AM Laneta A wrote:.Sondra-MVP called is requesting script for blood pressure monitor be sent to pharmacy on [phone]

## 2024-09-14 ENCOUNTER — Ambulatory Visit: Payer: Self-pay | Attending: Psychology | Admitting: Emergency Medicine

## 2024-09-14 ENCOUNTER — Encounter: Payer: Self-pay | Admitting: Emergency Medicine

## 2024-09-14 ENCOUNTER — Ambulatory Visit: Admitting: Mental Health

## 2024-09-14 ENCOUNTER — Telehealth: Payer: Self-pay

## 2024-09-14 ENCOUNTER — Other Ambulatory Visit: Payer: Self-pay

## 2024-09-14 ENCOUNTER — Ambulatory Visit: Payer: Self-pay

## 2024-09-14 ENCOUNTER — Other Ambulatory Visit: Payer: Self-pay | Admitting: Gastroenterology

## 2024-09-14 ENCOUNTER — Encounter: Payer: Self-pay | Admitting: Mental Health

## 2024-09-14 VITALS — BP 131/84 | HR 75 | Ht 71.0 in | Wt 219.0 lb

## 2024-09-14 VITALS — BP 132/88 | HR 82 | Ht 71.0 in | Wt 219.0 lb

## 2024-09-14 DIAGNOSIS — T402X5A Adverse effect of other opioids, initial encounter: Secondary | ICD-10-CM | POA: Insufficient documentation

## 2024-09-14 DIAGNOSIS — F1120 Opioid dependence, uncomplicated: Secondary | ICD-10-CM | POA: Insufficient documentation

## 2024-09-14 DIAGNOSIS — F1020 Alcohol dependence, uncomplicated: Secondary | ICD-10-CM | POA: Insufficient documentation

## 2024-09-14 DIAGNOSIS — F1420 Cocaine dependence, uncomplicated: Secondary | ICD-10-CM | POA: Insufficient documentation

## 2024-09-14 DIAGNOSIS — F17200 Nicotine dependence, unspecified, uncomplicated: Secondary | ICD-10-CM

## 2024-09-14 DIAGNOSIS — Z1389 Encounter for screening for other disorder: Secondary | ICD-10-CM | POA: Insufficient documentation

## 2024-09-14 DIAGNOSIS — K5903 Drug induced constipation: Secondary | ICD-10-CM | POA: Insufficient documentation

## 2024-09-14 MED ORDER — CLONIDINE HCL 0.1 MG PO TABS *I*
0.1000 mg | ORAL_TABLET | Freq: Four times a day (QID) | ORAL | 0 refills | Status: AC | PRN
Start: 2024-09-14 — End: 2024-09-28
  Filled 2024-09-14: qty 28, 7d supply, fill #0

## 2024-09-14 MED ORDER — BUPRENORPHINE HCL-NALOXONE HCL 8-2 MG SL FILM *I*
1.0000 | ORAL_FILM | Freq: Two times a day (BID) | SUBLINGUAL | 0 refills | Status: AC
Start: 2024-09-14 — End: 2024-09-16
  Filled 2024-09-14: qty 4, 2d supply, fill #0

## 2024-09-14 MED ORDER — BUPRENORPHINE HCL-NALOXONE HCL 2-0.5 MG SL FILM *I*
1.0000 | ORAL_FILM | Freq: Four times a day (QID) | SUBLINGUAL | 0 refills | Status: AC
Start: 2024-09-14 — End: 2024-09-16
  Filled 2024-09-14: qty 8, 2d supply, fill #0

## 2024-09-14 NOTE — BH Treatment Plan (Cosign Needed Addendum)
 [facility] Stabilization Treatment Plan Stabilization Treatment Plan  Date 09/14/24-11/13/24 Strengths Health, coping skills, education, employment, family supports, future aspirations and motivation. Challenges Substance use, mental health Patient Identified SUD Problem(s) It's created financial and emotional problems. Patient Identified SUD Goal(s) 1)  "I will attend stabilization appointments as recommended"2) "I will take medications as prescribed"3) "I will identify my aftercare plan" Treatment Interventions Provide safe and effective medication management for symptoms of withdrawalDemonstrate adherence to medication management/safety and aftercare planningCoordinate linkage with appropriate medical and counseling services consistent with the stated desire of the patient's recovery planIndividual counseling with [name], [facility], [facility] for 15-60 minutes every 1x week or as clinically indicatedIndividual sessions with Dr. Richie provider 1-3x per week to reach a therapeutic dose or as neededClinic Crisis Services as neededTransition Planning Individual Counseling with [name] Current pregnancy N/A Communicable diseases N/A Medical/Health concerns Pre-diaberes Tobacco/Nicotine Will discuss with Patient at next opportunity. Medication for SUD Buprenorphine 2-0.5 MG SL film 8 film 8-2 MG SL film 4 film  Discharge Criteria for this treatment setting When I've learned how to control my emotions without using.
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
## 2024-09-14 NOTE — Telephone Encounter (Signed)
 Writer contacted, spoke to the patient and scheduled an appointment with primary therapist, [name], for 11.6.25. Per in-basket request.
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
## 2024-09-14 NOTE — H&P (Signed)
 Primary Care Screen - Adult Length of session: 20 min11/3/2025Patient Name:  [name] Date of BIrth:  06-01-1987Patient Medical Record Number:  [id number] Patient Phone (Home):  [phone] (home) Patient Mobile Phone:  Telephone Information: Mobile [phone] Patient Address:  [address] [place] [address] Screen:Visit VitalsHt 1.803 m (5' 11) Wt 99.3 kg (219 lb) BMI 30.54 kg/m Name of Primary Care Provider:  Versa Lye, MD   Past Medical History[1]Past Surgical History[2]Medications/Vitamins/Supplements       Last Dose Start Date End Date Provider   acetaminophen  (TYLENOL ) 500 mg tablet As Needed  03/16/22  --  Raiford, Eloisa Norris, MD   Take 2 tablets (1,000 mg total) by mouth 3 times daily   Patient not taking: Reported on 08/20/2024   albuterol  HFA (PROVENTIL , VENTOLIN ) 108 (90 Base) MCG/ACT inhaler Taking  08/13/24  --  Versa Lye, MD   Inhale 1-2 puffs into the lungs every 4-6 hours as needed for Wheezing.   atorvastatin  (LIPITOR) 20 mg tablet Taking  07/31/23  09/14/24  Versa Lye, MD   Take 1 tablet (20 mg total) by mouth daily.   azelastine  (OPTIVAR ) 0.05 % ophthalmic solution Taking  08/20/24  --  Versa Lye, MD   INSTILL 1 DROP INTO BOTH EYES TWICE A DAY   blood pressure monitor, automatic with arm cuff Not Taking  09/11/24  --  Versa Lye, MD   Use as directed to monitor blood pressure   Patient not taking: Reported on 09/14/2024   buprenorphine-naloxone  (SUBOXONE) 2-0.5 MG SL film Taking  09/14/24  09/16/24  Malcho, Jade J, MD   Place 1 film under the tongue 4 times daily for 2 days for Opioid Dependence. Max daily dose: 4 films   buprenorphine-naloxone  (SUBOXONE) 8-2 MG SL film  --  09/14/24  09/16/24  Malcho, Jade J, MD   Place 1 film under the tongue 2 times daily for 2 days for Opioid Dependence. Max daily dose: 2 films   cloNIDine  (CATAPRES) 0.1 mg tablet Taking  09/14/24  09/21/24  Malcho, Jade J, MD   Take 1 tablet (0.1 mg total) by mouth every 6 hours as needed (withdrawal).   CPAP machine (HCPCS [phone]) Taking  02/15/23  --  Cleone, Barton Solar, MD   [employer] CPAP 5-18 cmH2O with pressure relief & all needed supplies. Duration: Lifetime   fluticasone  (FLONASE ) 50 MCG/ACT nasal spray Taking  05/19/24  --  Versa Lye, MD   Spray 1 spray into nostril daily.   loratadine  (CLARITIN ) 10 mg tablet Taking  05/19/24  --  Versa Lye, MD   Take 1 tablet (10 mg total) by mouth daily.   metFORMIN  (GLUCOPHAGE -XR) 500 mg 24 hr tablet Taking  07/31/23  --  Versa Lye, MD   Take 1 tablet (500 mg total) by mouth daily (with dinner). Swallow whole. Do not crush, break, or chew.   omeprazole  (PRILOSEC) 40 mg capsule Taking  08/20/24  --  Versa Lye, MD   TAKE 1 CAPSULE BY MOUTH DAILY.   PAP supplies Taking  01/29/24  --  Cedric Nest, PA   Perform a mask fitting & provide all needed PAP replacement supplies. Duration: LifetimeReports the mask does not stay on all night, straps seem wrong and the tube pops off the mask as well.       Allergy History as of 09/14/24   ENVIRONMENTAL ALLERGIES     Noted Status Severity Type Reaction  03/08/20 1150 Serene Domino, RN 03/08/20 Active  Allergy Rhinitis  Nutrition         Nutritional Concerns: Patient has no nutritional concerns. No follow-up necessary.Estimated body mass index is 30.54 kg/m as calculated from the following:  Height as of this encounter: 1.803 m (5' 11).  Weight as of this encounter: 99.3 kg (219 lb).BMI outside normal limits, recommend follow up with PCP.  Which, if any, does the patient say describes him?NoneTobacco Use History[3]Does the patient use tobacco products?Yes,  Provided counseling on quittingPain Assessment:                       Fall Risk Screen:      Infectious Disease Screen:       After review of documentation and assessment during session, patient appears to be free of serious communicable disease that can be transmitted through ordinary contact with others.        Patient indicated that they understood the above information and was advised that regardless of the above findings, they should communicate their current condition with their PCP: yesRecommended Health Services:- Based on the above Primary Care Screening, patient is in agreement with the recommendations for further evaluation.- PPD placed at today's visit. Patient advised to return to clinic in 48-72 hours to have PPD read.- Patient has had a physical exam within the past 12 months.  Based on the interview with the patient it is determined that they are not in need of a physical examination at this time.PPD placed in LEFT forearm, lot # [id number] exp. 01/2027. Pt educated that they will need to return for a read in 48 to 72 hours. Pt denies history of positive PPD.  [1] Past Medical History:Diagnosis Date  Allergy   Anxiety   Asthma   Depression   Pre-diabetes  [2] Past Surgical History:Procedure Laterality Date  PR DBRDMT FX&/DISLC SUBQ T/M/F BONE Left 01/25/2020  Procedure: DEBRIDEMENT, OPEN FRACTURE DISLOCATION, INDEX FINGER;  Surgeon: Suann Lowers, MD;  Location: [facility];  Service: Orthopedics  PR NEUROPLASTY DIGITAL 1/BOTH SAME DIGIT Left 01/25/2020  Procedure: EXPLORATION TENDON/NERVE, HAND;  Surgeon: Suann Lowers, MD;  Location: [facility];  Service: Orthopedics  PR OPEN TX PHALANGEAL SHAFT FRACTURE PROX/MIDDLE EA Left 01/25/2020  Procedure: ORIF, INDEX FINGER;  Surgeon: Suann Lowers, MD;  Location: [facility];  Service: Orthopedics  PR OSTEOTOMY PHALANX FINGER EACH Left 03/15/2021   Procedure: OSTEOTOMY, HAND;  Surgeon: Romelle Nurse, MD;  Location: [facility];  Service: Orthopedics [3] Social HistoryTobacco Use Smoking Status Every Day  Current packs/day: 0.50  Average packs/day: 0.5 packs/day for 4.6 years (2.3 ttl pk-yrs)  Types: Cigarettes  Start date: 01/22/2020 Smokeless Tobacco Never Tobacco Comments  The data in the grid above may be an average based on historical data and used for Lung Cancer Screening Eligibility.  If you find it inaccurate you can delete it and take a more accurate history.

## 2024-09-14 NOTE — BH Intake Assessment (Signed)
 Level of Care Determination Which substance is the person using that has the most potential to cause harm to self or others?Opioids: heroin, morphine , Dilaudid , Demerol, Darvon, Codeine, Tylenol  2,3,4; Oxycontin /OxycodoneDIAGNOSIS:The client meets 10 criteria for a severe substance use disorderThe client meets criteria for tolerance and/or withdrawalRISK FACTORS:The following are client risk factors endorsed during the interview.Do any of the following apply?Is medication assisted treatment (MAT) available in the community?Is the person willing to utilize it on an outpatient basis?Is the person expected to stabilize at outpatient (either an Outpatient Clinic or an Opioid Treatment Program)?RESOURCES:The following are available resources endorsed during the interviewDoes the person have strong self-efficacy/confidence that he/she can pursue recovery goals outside of a highly structured setting?Is the person connected to a social/family network supportive of recovery goals?Has the person demonstrated a therapeutic alliance with at least one professional helper in the past?Does the person have stable access to food and shelter?LEVEL OF CARE RECOMMENDED BY LOCADTR:Outpatient Services + [facility]-certified outpatient services have multi-disciplinary teams that include medical staff and a wellsite geologist. These programs provide the following procedures: group and individual counseling; education about, orientation to, and opportunity for participation in, relevant and available self-help groups; alcohol and substance abuse disease awareness and relapse prevention; HIV and other communicable disease education, risk assessment, supportive counseling and referral; and family treatment. In addition, social and health care services, skill development in accessing community services, activity therapies, information and education about nutritional requirements, and vocational  and educational evaluation must be available either directly or through written agreements. Procedures are provided according to an individualized assessment and treatment plan.NEED TO OVERRIDE LOCADTR RECOMMENDATION:There is a need to override LOCADTR recommendation for the following reason:Not applicableFINAL RECOMMENDED LEVEL OF CARE: Outpatient Services + [facility]-certified outpatient services have multi-disciplinary teams that include medical staff and a wellsite geologist. These programs provide the following procedures: group and individual counseling; education about, orientation to, and opportunity for participation in, relevant and available self-help groups; alcohol and substance abuse disease awareness and relapse prevention; HIV and other communicable disease education, risk assessment, supportive counseling and referral; and family treatment. In addition, social and health care services, skill development in accessing community services, activity therapies, information and education about nutritional requirements, and vocational and educational evaluation must be available either directly or through written agreements. Procedures are provided according to an individualized assessment and treatment plan.

## 2024-09-14 NOTE — BH Intake Assessment (Signed)
 [facility] Admission Form/Orientation to Service Date: 09/14/2024 Time: 1:51 PM PSYCH GROUP THERAPY  Is this a group service?: No  Group Face to Face Duration:  20Number of Participants:  1Was the session conducted in the patient's preferred language? Yes  If yes, what is the preferred language? EnglishIf the patient is bilingual, did the patient prefer to conduct the session in English and demonstrate understanding? N/A Admission Decision:I have reviewed the pre-admission assessment and the level of care determination.  It has been decided that this person will be admitted to this service as: The individual is determined to have a substance use disorder based on the criteria in the most recent version of the Diagnostic and Statistical Manual (DSM) or the International Classification of Diseases (ICD), the patient reports no known or suspected infectious disease that can be a danger to others and that is spread through casual contact, and the individual appears not to be in need of acute hospital care, acute psychiatric care, a higher level of chemical dependence treatment services or other intensive services that cannot be provided in conjunction with outpatient care or would prevent him/her from participating in a chemical dependence outpatient program.Orientation to Service:I have reviewed and provided the patient a copy of the following: Treatment Agreement, Potential Consequences of Premature Discharge, Program Rules, Program Schedules, Patient Rights, Program/Recovery Vocabulary Self Help (mtg. list supplied), Summary of Federal Confidentiality, Voluntary Nature of Admission, Group Process/Group Norms, Toxicology screen procedures, How to contact Primary TherapistPhysical:Physical completed less than or equal to the last twelve months:  N/A The patient was seen by nursing staff for a brief medical screen Patient understood the above information:  YesPatient was admitted to the program within 72 hours of intake assessment:  No Comment: Program is not open on the weekendsInitial Treatment Plan Due: 12/2/25Treatment plan review date (6mos from today): 11/12/24 Comments: [name] is a 38 y.o. African American Male, who presents for ALD Orientation on this date.  [name] presents as alert and oriented x3, as well as future/goal oriented.  Writer reviewed [facility] release, Confidentiality, Program Rules, Journalist, Newspaper allowed time to answer questions related to paperwork.  Writer described the ALD program, as well as the additional services that [facility] provides.  Writer will be [name]'s Designer, Jewellery and has scheduled [name] for an Individual appointment on 09/16/24 at 9:45 am, following his Stabilization appointment with Dr. Richie.

## 2024-09-14 NOTE — Progress Notes (Signed)
 [facility] Opioid Treatment Note Induction25 minutesContact Type:  Individual PsychotherapyHPILast use of Opioids (either prescribed or street): oxycodone  30 mg last nightLast use of benzodiazepine: deniesLast use of alcohol: yes, halloween Last use of any other substance: deniesCOWS assessment to be completed for Stabilization Induction appointments  09/14/2024   1:00 PM AMB COWS Resting Pulse Rate 0 Sweating 0 Restlessness 0 Pupil Size 1 Bone or Joint Aches 1 Runny Nose or Tearing 0 GI Upset 0 Tremors 0 Yawning 0 Anxiety or Irritabilitiy 0 Gooseflesh Skin 0 Opiate Withdrawal Score 2 Results from most recent toxicology screen:  Past Medications used for Opioid Use Disorder: Methadone: denies ever being prescribed   Highest methadone dose in the past: n/a  Prescribed at n/a  Non Prescribed methadone: n/a Buprenorphine/Naloxone  (Suboxone): denies ever being prescribed   Prescribed at n/a  Non Prescribed buprenorphine (Suboxone): n/aAllergiesAllergies[1]Opioid Replacement Treatment EducationTeaching regarding: purpose opioid replacement and difference between methadone and buprenorphineTeaching regarding method of taking sublingual medication.Teaching regarding complications and side effects of methadone and buprenorphine.Teaching regarding interaction of opioid replacement meds medication with alcohol, benzodiazepines, and other drugs.AssessmentAfter review of information, this patient is an appropriate buprenorphine candidate.Review of Safety GuidelinesI have reviewed the [facility] Alcohol, Sedative, Hypnotic, Anxiolytic Safety Agreement with the patient and have provided the patient an opportunity to ask questions relative to this treatment and the requirements of the program,  Patient verbalizes understanding of above safety guidelines:  YesPlanPrinted educational material was  provided to patient. , Induction instructions were provided to patient., Patient was encouraged not to drive on day of induction.  , and Patient is in agreement with above plans.  [1] AllergiesAllergen Reactions  Environmental Allergies Rhinitis

## 2024-09-15 ENCOUNTER — Other Ambulatory Visit: Payer: Self-pay

## 2024-09-15 ENCOUNTER — Encounter: Payer: Self-pay | Admitting: Internal Medicine

## 2024-09-15 ENCOUNTER — Ambulatory Visit: Payer: Self-pay | Admitting: Internal Medicine

## 2024-09-15 VITALS — BP 129/91 | HR 76

## 2024-09-15 DIAGNOSIS — R1319 Other dysphagia: Secondary | ICD-10-CM

## 2024-09-15 DIAGNOSIS — I10 Essential (primary) hypertension: Secondary | ICD-10-CM

## 2024-09-15 DIAGNOSIS — G44229 Chronic tension-type headache, not intractable: Secondary | ICD-10-CM

## 2024-09-15 MED ORDER — AMLODIPINE BESYLATE 5 MG PO TABS *I*
5.0000 mg | ORAL_TABLET | Freq: Every day | ORAL | 3 refills | Status: AC
Start: 2024-09-15 — End: ?

## 2024-09-15 MED ORDER — ESCITALOPRAM OXALATE 5 MG PO TABS *I*
5.0000 mg | ORAL_TABLET | Freq: Every day | ORAL | 5 refills | Status: DC
Start: 2024-09-15 — End: 2024-10-07

## 2024-09-15 NOTE — Progress Notes (Signed)
 [facility] Family Medicine - Office VisitCHIEF COMPLAINT: Chief Complaint Patient presents with  Follow-up   Pt states having constant headaches and blood pressure concerns.  He's been following with [facility] for OUD, stimulant use disorder in remission, and nicotine dependence.History of Present IllnessHtn: never got amlodipine, still willing to try itRequests new omeprazole  prescription Burping, feels food gets stuck while eatingHas been taking for a couple years Daily headache on right side starts in back of headTakes excedrin and it resolves completely No pulsation No photosensitivity No associated symptoms Has been going on for a few monthsMostly concerned about etiology of headachesSleeps poorly, not currently using cpap because it had to be fixed Endorses current stress OBJECTIVEBP (!) 129/91 (BP Location: Right arm, Patient Position: Sitting, Cuff Size: adult)   Pulse 76 BP Readings from Last 3 Encounters: 09/15/24 (!) 129/91 08/20/24 (!) 143/106 05/19/24 (!) 143/93 Vitals: reviewed.Gen: alert and appropriately conversant.Assessment & PlanEssential hypertensionNot at goal (<130/80)Agreeable to take amlodipine, prescription resent Orders:  amLODIPine (NORVASC) 5 mg tablet; Take 1 tablet (5 mg total) by mouth daily.Chronic tension-type headache, not intractableDiscussed exacerbating factors for headaches such a sleep quality, stress. Agreeable to restarting cpap. Additionally trial low dose escitalopramOrders:  escitalopram (LEXAPRO) 5 mg tablet; Take 1 tablet (5 mg total) by mouth daily.Esophageal dysphagiaRecommend referral to GI for possible endoscopy given new symptoms of dysphagiaOrders:  AMB [facility]Follow up in about 6 months (around 03/15/2025) for high blood pressure, headaches.Raquel Arias-Camison MS4The student was personally supervised by me  during the patient examination. I personally saw and evaluated the patient, provided the medical decision-making, and reviewed and verified the key elements of the student documentation. I have edited the student's note and confirm the findings and plan of care as documented. Evalene Care, MD MPH

## 2024-09-15 NOTE — Progress Notes (Signed)
 Intake -Stabilization Team MeetingNovember 4, 2025Present: Annabelle Bunnell, MSN, PMHNP-BC; Clotilda Mash, BS, CASAC; Lauren Smith-Friedman, LMSW-CASAC; Janine Coppini, [facility], NCC, CASAC; [employer], LCSW-R, CASAC; [name], MHC, NCC, CASAC-T; Darice Dooms, MS, CASAC; Laymon Cornea, RN; Kelly Rinks, MS, CASAC; Leone Salon, PA; Jade Malcho, MD; Chalmer Ashworth, RN; Nash Parkinson, [facility]; Debby Clause, CRPA-P; Manus Carder, CRPA; Lyle Curry, MHC; Darryle Abu, ATR-BC, LCAT, CASAC; Geraline Savoy, RNTerrence Worthing [id number] ALD:  Patient met with Dr. Casimir to begin suboxone stabilization on 09/14/24 and was admitted to [facility] on 09/14/24.  Patient has appointments with therapist and Dr. Richie on 09/16/24. Morning appointments work well for him. He reported using 60-90 mg of oxycodone  daily and said he was without opioids for a couple of days. He presented in withdrawal and was given 2 mg of Suboxone with a good response. Disposition: There was discussion around reported opioid use and initial toxicology results. Another screen was collected on 09/14/24. Jovi was recommended to continue in Stabilization.

## 2024-09-15 NOTE — Assessment & Plan Note (Addendum)
 Not at goal (<130/80)Agreeable to take amlodipine, prescription resent Orders:  amLODIPine (NORVASC) 5 mg tablet; Take 1 tablet (5 mg total) by mouth daily.

## 2024-09-15 NOTE — Progress Notes (Deleted)
 [facility] Family Medicine - Office VisitCHIEF COMPLAINT: No chief complaint on file.He's been following with [facility] for OUD, stimulant use disorder in remission, and nicotine dependence.History of Present IllnessOBJECTIVEThere were no vitals taken for this visit.BP Readings from Last 3 Encounters: 09/14/24 131/84 09/14/24 132/88 08/20/24 (!) 143/106 Vitals: reviewed.Gen: alert and appropriately conversant.Assessment & PlanAssessment & PlanEssential hypertension  No diagnosis found.No follow-ups on file.Evalene Care, MD MPH

## 2024-09-16 ENCOUNTER — Ambulatory Visit: Payer: Self-pay | Admitting: Psychology

## 2024-09-16 ENCOUNTER — Telehealth: Payer: Self-pay | Admitting: Mental Health

## 2024-09-16 ENCOUNTER — Ambulatory Visit: Payer: Self-pay | Admitting: Mental Health

## 2024-09-16 VITALS — BP 127/79 | HR 81 | Ht 71.0 in | Wt 223.0 lb

## 2024-09-16 DIAGNOSIS — F1120 Opioid dependence, uncomplicated: Secondary | ICD-10-CM

## 2024-09-16 LAB — OPIATES - BASIC, URINE (DRUGSCAN)
6-Acetylmorphine,Urine: NEGATIVE ng/mL
Coedine, Urine: NEGATIVE ng/mL
Dihydrocodeine, Urine: NEGATIVE ng/mL
Hydrocodone, Urine: NEGATIVE ng/mL
Hydromorphone, Urine: NEGATIVE ng/mL
Morphine, Urine: NEGATIVE ng/mL
Norhydrocodone, Urine: NEGATIVE ng/mL

## 2024-09-16 LAB — STRONG RECOVERY OUTPATIENT PANEL, URINE (DRUGSCAN)
Amphetamines, Urine: NEGATIVE ng/mL
Benzodiazepines, Urine: NEGATIVE ng/mL
Buprenorphine, Urine: NEGATIVE ng/mL
Cocaine Metabolites, Urine: NEGATIVE ng/mL
Creatinine, Urine: 177.1 mg/dL (ref >20.00)
ETG/ETS, Urine: NEGATIVE ng/mL
Fentanyl, Urine: NEGATIVE ng/mL
Marijuana Metabolite, Urine: NEGATIVE ng/mL
Nitrite, Urine: 20.9 ug/mL (ref <200)
pH, Urine: 5.5 (ref 4.50–9.00)

## 2024-09-16 LAB — OXYCODONE/OXYMORPHONE, URINE (DRUGSCAN)
Noroxycodone, Urine: 5194.2 ng/mL — AB
Oxycodone, Urine: 2101.6 ng/mL — AB
Oxymorphone, Urine: 419 ng/mL — AB

## 2024-09-16 NOTE — Telephone Encounter (Signed)
 Dietitian.  There was no answer.  Writer left a message and explained that he is calling because clinical research associate forgot to message nursing while Nylen was at the clinic to have his PPD read.  Writer shared that he will reach out to [employer] to see if a Nurse at their clinic is able to read the PPD.  Writer also shared that [place] could also come to [facility] to have the PPD read.  Writer shared that if Viola's PPD is not able to be read by the end of the day tomorrow a new PPD will ned to be placed.  Writer encouraged a callback.

## 2024-09-16 NOTE — Progress Notes (Signed)
 Stabilization Services Provider Daily Note Chief Complaint/Reason for Encounter: Opioid use disorder, severe, dependence/Stabilization Day 3HPI: [name] is a 38 y.o. male with Opioid use disorder, severe, dependence started stabilization on 09/14/2024.History of Present IllnessThe patient presents for a follow-up of Suboxone treatment.Interim History: He reports a positive response to the medication, with a noticeable reduction in cravings and associated discomfort. The effects of the morning dose, administered at 8:45 AM, appear to diminish around 8:00 PM or 9:00 PM, prompting him to take an additional dose. This regimen of two doses per day, spaced approximately 12 hours apart, seems to be effective. His sleep pattern remains undisturbed, and he does not experience any anxiety. He reports mild nausea post-medication, but it is not severe. He also reports no constipation. He experiences headaches, but these are not attributed to the medication as they are a pre-existing condition.Treatment and Interventional History: He has exhausted his supply of 2 mg tablets and has not attempted to divide the 8 mg tablets. His last dose was taken this morning at 8:40 AM. He reports no symptoms of withdrawal such as sweats, chills, restlessness, body aches, or joint pain. He was prescribed Lexapro during his consultation yesterday and is seeking confirmation on its compatibility with his current medication.Social History:- Reports no use of alcohol, cannabis, or weed since his last visit- Admits to vapingPertinent Negatives: He reports no thoughts of self-harm or harm to others, and no feelings of insecurity at home.Medications:Current:- Suboxone 2 mg, administered twice daily- LexaproSOCIAL HISTORYHe reports no alcohol intake since the last visit. He does not smoke cigarettes but vapes.New medical issues since last visit: started lexapro 5 mg dailySubstance  use since last visit: nicotine vapeSubjective report of symptoms: as aboveSleep: goodAnxiety: deniesCravings: deniesSide Effects: denies constipation, mild Nausea following dose, denies headachesToxicology:  09/09/24 12:50 Amphetamines, Urine Negative Benzodiazepines, Urine Negative Buprenorphine, Urine Negative Cocaine Metabolites, Urine Negative Creatinine, Urine 209.6 ETG/ETS, Urine Negative Fentanyl , Urine Negative Marijuana Metabolite, Urine Negative Nitrite, Urine 31.8 Opiates - Basic, Urine Negative Oxycodone /Oxymorphone, Urine Negative pH, Urine 6.2 Review of SystemsPertinent items are noted in HPI.BP 127/79   Pulse 81   Ht 1.803 m (5' 11)   Wt 101.2 kg (223 lb)   BMI 31.10 kg/m Current COWS: Complete Assessment (From Documentation Flowsheet) - COWSResting Pulse Rate: Pulse rate 81-100Sweating: No report of chills or flushingRestlessness: Able to sit stillPupil Size: Pupils pinned or normal size for room lightBone or Joint Aches: Not presentRunny Nose or Tearing: Not presentGI Upset: No GI symptomsTremors: No tremorYawning: No yawningAnxiety or Irritabilitiy: NoneGooseflesh Skin: Skin is smoothOpiate Withdrawal Score: 1General Physical AssessmentGeneral Appearance: alert, appears stated age, cooperative, and no distressEyes: no conjunctival injection or pupillary dilationExtremities: extremities normal, atraumatic, no cyanosis or edemaSkin: Skin color, texture, turgor normal. No rashes or lesionsCurrent Outpatient Medications on File Prior to Visit Medication Sig Dispense Refill  amLODIPine (NORVASC) 5 mg tablet Take 1 tablet (5 mg total) by mouth daily. 90 tablet 3  escitalopram (LEXAPRO) 5 mg tablet Take 1 tablet (5 mg total) by mouth daily. 30 tablet 5  buprenorphine-naloxone  (SUBOXONE) 2-0.5 MG SL film Place 1 film under the tongue 4 times daily for 2 days for Opioid Dependence. Max  daily dose: 4 films 8 film 0  cloNIDine (CATAPRES) 0.1 mg tablet Take 1 tablet (0.1 mg total) by mouth every 6 hours as needed (withdrawal). 28 tablet 0  buprenorphine-naloxone  (SUBOXONE) 8-2 MG SL film Place 1 film under the tongue 2 times daily for 2 days for Opioid Dependence. Max daily  dose: 2 films 4 film 0  blood pressure monitor, automatic with arm cuff Use as directed to monitor blood pressure (Patient not taking: Reported on 09/14/2024) 1 kit 0  omeprazole  (PRILOSEC) 40 mg capsule TAKE 1 CAPSULE BY MOUTH DAILY. 90 capsule 3  azelastine  (OPTIVAR ) 0.05 % ophthalmic solution INSTILL 1 DROP INTO BOTH EYES TWICE A DAY 18 mL 3  albuterol  HFA (PROVENTIL , VENTOLIN ) 108 (90 Base) MCG/ACT inhaler Inhale 1-2 puffs into the lungs every 4-6 hours as needed for Wheezing. 18 each 5  fluticasone  (FLONASE ) 50 MCG/ACT nasal spray Spray 1 spray into nostril daily. 16 g 6  loratadine  (CLARITIN ) 10 mg tablet Take 1 tablet (10 mg total) by mouth daily. 90 tablet 3  PAP supplies Perform a mask fitting & provide all needed PAP replacement supplies. Duration: LifetimeReports the mask does not stay on all night, straps seem wrong and the tube pops off the mask as well. 1 each 0  metFORMIN  (GLUCOPHAGE -XR) 500 mg 24 hr tablet Take 1 tablet (500 mg total) by mouth daily (with dinner). Swallow whole. Do not crush, break, or chew. 90 tablet 3  atorvastatin  (LIPITOR) 20 mg tablet Take 1 tablet (20 mg total) by mouth daily. 90 tablet 3  CPAP machine (HCPCS [phone]) [employer]Med Auto CPAP 5-18 cmH2O with pressure relief & all needed supplies. Duration: Lifetime 1 each 0  acetaminophen  (TYLENOL ) 500 mg tablet Take 2 tablets (1,000 mg total) by mouth 3 times daily (Patient not taking: Reported on 08/20/2024) 100 tablet 0 No current facility-administered medications on file prior to visit. ASSESSMENT OF RISK FOR SUICIDAL BEHAVIORChanges in risk for suicide from baseline Formulation of Risk and/or previous intake,  including newly identified risk, if any:  None noneASSESSMENT OF RISK FOR VIOLENT BEHAVIORChanges in risk for violence from baseline Formulation of Risk and/or previous intake, including newly identified risk, if any: None noneMEDICAL DECISION MAKINGDiagnoses:  ICD-10-CM ICD-9-CM 1. Opioid use disorder, moderate, dependence  F11.20 304.00 Darroll Bredeson is a 38 y.o. male with a h/o opioid use disorder, depression/anxiety and prediabetes, presenting for MOUD follow-up. Taking suboxone 2 mg BID with good effect. Assessment & PlanProblems:- Opioid Dependence- Mild Nausea- Headaches (pre-existing condition)Clinical Impression:The patient is currently managing opioid dependence with Suboxone 2 mg twice daily, which has been effective in reducing cravings and sickness. He reports that the medication's effects diminish towards the evening, necessitating a second dose around 8-9 PM. Mild nausea occurs after taking the medication but is not severe enough to warrant additional treatment. Headaches are present but are attributed to a pre-existing condition, for which he recently consulted another physician and was prescribed Lexapro. The patient denies experiencing anxiety, constipation, or withdrawal symptoms such as sweats, chills, restlessness, or body aches. He also reports no thoughts of self-harm or harm to others and feels safe at home. There has been no recent use of alcohol, cannabis, or cigarettes, although he vapes.Therapeutic Intervention:During the session, the following interventions were utilized:- Medication management: Adjusted the administration of Suboxone by advising the patient to cut 8 mg films into four pieces and take one piece twice daily to maintain the same dosage.- Psychoeducation: Provided information on the safety of taking Lexapro alongside Suboxone.- Monitoring withdrawal symptoms: Assessed for withdrawal symptoms using a standardized  scale.Plan:- Continue taking Suboxone 2 mg twice daily by cutting 8 mg films into four pieces.- Continue taking Lexapro as prescribed.- Monitor and report any changes in nausea or headaches.- Maintain current self-care practices and avoid substance use.Follow-up:- Next appointment scheduled with Dr. Malcol  on 09/21/2024 at 9:00 AM.- Goals for the session include assessing the effectiveness of the adjusted Suboxone dosage and monitoring any changes in symptoms or side effects.Counseling/Coordination of Care:Instruction for detox treatment/follow-up discussedEducation provided to patient/family/caregiverSide effects and benefits of his medications were discussed Side effect management discussedAdjustments of medication timing discussedRisk factor reduction discussedImportance of adherence to treatment regimen discussedRelapse prevention discussedTobacco Dependence discussed Plan:- cont suboxone 2 mg BID (utilize remaining 8 mg films and cut in quarters)- RTC on 11/10 at 9 am with Dr. Dorethea personally spent 15 minutes on the calendar day of the encounter, including pre and post visit work.Leylani Duley Lynette Mathis-Uwanogho, MDAddiction Psychiatrist[facility]11/5/20259:26 AM

## 2024-09-17 ENCOUNTER — Ambulatory Visit: Payer: Self-pay | Attending: Psychiatry | Admitting: Psychiatry

## 2024-09-17 ENCOUNTER — Telehealth: Payer: Self-pay

## 2024-09-17 ENCOUNTER — Other Ambulatory Visit: Payer: Self-pay

## 2024-09-17 NOTE — Telephone Encounter (Signed)
 Copied from CRM #[id number]. Topic: Appointments - Cancel Appointment>> Sep 17, 2024  2:17 PM Tillman T wrote:patient,  is calling to cancel the patients  FUV appointment which is currently scheduled on 11/06 with Synetta Alm Rogue.Reason for the cancellation: Car troublesHas the appointment been cancelled? yesHas the appointment been rescheduled? no

## 2024-09-18 NOTE — Progress Notes (Signed)
 [facility] Brief Treatment/Intervention Note Brief Treatment2 minutesContact Type:  Individual PsychotherapyWas the session conducted in the patient's preferred language? Yes  If yes, what is the preferred language? EnglishIf the patient is bilingual, did the patient prefer to conduct the session in English and demonstrate understanding? N/AContact Type:Location: On SiteDirect, Face to Face, Individual PsychotherapyTarget Behavior: Individual PsychotherapyEvidence-Based or Clinical Intervention used: Cognitive Behavioral TherapyMedication Supported RecoveryIndividual Drug CounselingLast Toxicology Screen Date: 11/3/25Result of last toxicology screen: PendingReason for completing toxicology screening: RandomPatient reported last use date: Chael did not disclose any substance use since last appointment.Results discussed with patient NoActions Based on Toxicology Report: n/aPatient reported type of substance used and route: Evon reports no current substance useIs the patient using IV? N/aIs the patient using alone? N/aDoes the patient have access to Narcan ? YesWas a Narcan  kit provided? No, if no, explain declinedPatient educated on signs and symptoms of overdose and use of Narcan  kit?  NoPatient Response to Session: He reports no significant issues since starting Suboxone on  Monday. He has been adhering to his treatment plan and maintaining a positive outlook. He acknowledges the emotional challenges associated with the holiday season, particularly November, due to the loss of his father on Thanksgiving. Despite these challenges, he believes he is managing well. He had a consultation with his primary care physician yesterday and has an appointment scheduled for tomorrow with [facility].Content of Therapy:During the session, the patient discussed his progress with starting Suboxone and his feelings since the  last appointment. He mentioned that he has been feeling alright and that things are going well with his wife. The patient shared that November is a difficult month for him due to the passing of his father on Thanksgiving. He expressed that he is handling the grief well but anticipates potential challenges as Thanksgiving approaches. The patient also discussed his busy schedule with multiple appointments, including seeing his primary care physician and another healthcare provider named Alm. Clinical Impression:The patient appears to be managing his weight and substance use disorder effectively. He reported feeling alright and maintaining a positive outlook despite the emotional challenges associated with the upcoming holiday season. The patient's engagement in therapy and willingness to discuss his feelings and experiences indicate a positive response to treatment. There are no significant changes in symptoms reported during this session. Therapeutic Intervention:The session included discussions on coping strategies for grief, the importance of taking things one day at a time, and the role of targeted case managers in providing support and resources. The patient was encouraged to continue monitoring his progress and complete an assessment form for further evaluation.Recommendations for Follow-up or Referral:- Patient's next stablization appointment with Dr. Richie is scheduled for 09/21/2024 at 9:00 AM.- Patient is scheduled for a follow-up visit with writer on Thursday, 09/24/2024 at 9:00 AM. Notes & Risk Factors:- Grief related to the passing of his father.- Busy schedule with multiple appointments.- Positive relationship with his wife as a protective factor.Note: This note Contains text generated by [employer]   Though some attempt at proofreading has been made, some transcription errors may remain.

## 2024-09-21 ENCOUNTER — Other Ambulatory Visit: Payer: Self-pay | Admitting: Emergency Medicine

## 2024-09-21 ENCOUNTER — Other Ambulatory Visit: Payer: Self-pay

## 2024-09-21 ENCOUNTER — Encounter: Payer: Self-pay | Admitting: Emergency Medicine

## 2024-09-21 ENCOUNTER — Ambulatory Visit: Payer: Self-pay | Admitting: Emergency Medicine

## 2024-09-21 ENCOUNTER — Telehealth: Payer: Self-pay | Admitting: Psychiatry

## 2024-09-21 ENCOUNTER — Telehealth: Payer: Self-pay

## 2024-09-21 ENCOUNTER — Ambulatory Visit: Attending: Gastroenterology

## 2024-09-21 VITALS — BP 128/78 | HR 73 | Ht 71.0 in | Wt 223.0 lb

## 2024-09-21 DIAGNOSIS — F17200 Nicotine dependence, unspecified, uncomplicated: Secondary | ICD-10-CM

## 2024-09-21 DIAGNOSIS — R1310 Dysphagia, unspecified: Secondary | ICD-10-CM

## 2024-09-21 DIAGNOSIS — K219 Gastro-esophageal reflux disease without esophagitis: Secondary | ICD-10-CM

## 2024-09-21 DIAGNOSIS — F1120 Opioid dependence, uncomplicated: Secondary | ICD-10-CM

## 2024-09-21 MED ORDER — BUPRENORPHINE HCL-NALOXONE HCL 2-0.5 MG SL FILM *I*
1.0000 | ORAL_FILM | Freq: Two times a day (BID) | SUBLINGUAL | 0 refills | Status: AC
Start: 2024-09-21 — End: 2024-09-29
  Filled 2024-09-21: qty 16, 8d supply, fill #0

## 2024-09-21 MED ORDER — NICOTINE POLACRILEX 2 MG MT GUM *I*
2.0000 mg | CHEWING_GUM | OROMUCOSAL | 0 refills | Status: AC | PRN
Start: 2024-09-21 — End: 2024-10-21
  Filled 2024-09-21: qty 110, 9d supply, fill #0

## 2024-09-21 NOTE — Telephone Encounter (Signed)
 Spoke with [name] , patient, at check out: Patient was seen by Fairy Salon, NP today and the Provider's recommendations were: PROCEDURE: EGDPhysician: Any physicianLocation: [facility] or HighlandSedation: General AnesthesiaPrep: EGD PrepTime frame: 4-6 weeksAnticoagulation Hold Needed? NoAICD? NoVariceal screen? No . Unable to schedule all appts due to  procedure scheduler will call patient to schedule GA procedure  AVS provided to patient via Mychart

## 2024-09-21 NOTE — Telephone Encounter (Signed)
 Writer called [name] to explore if he wanted to reschedule for this week. He shared that he could not do tomorrow, due to work, and will keep the 11/20 appt. He shared appreciate you and said that he will be there at the 11/20 appt.

## 2024-09-21 NOTE — Progress Notes (Signed)
 PPD placed in LEFT forearm, lot # [id number] exp. 01/2027. Pt educated that they will need to return for a read in 48 to 72 hours. Pt denies history of positive PPD.

## 2024-09-21 NOTE — Progress Notes (Signed)
 Stabilization Services Provider Daily Note Chief Complaint/Reason for Encounter: Opioid use disorder, severe, dependence/Stabilization Day 8HPI: [name] is a 38 y.o. male with Opioid use disorder, severe, dependence started stabilization on 11/3 and followed up with Dr. Richie on 11/5. New medical issues since last visit: denies, although has had intermittent compliance with his lexapro and DM medications - considering the use of a pill box. He also did not write down his suboxone doses each day and thinks he took a full 8mg  film some days rather than cutting in 4's. He did not take a night time dose when he took 8mg  in the morning, and then had some nauseaSubstance use since last visit: denies any use of oxycodone  this week. Subjective report of symptoms: Sleep: sleeping OK, wakes up occasionally in the middle of the night but goes back to sleep; wake up restedAnxiety, depression: some days are good, some days are bad. Cravings: had some intermittent cravings but the suboxone helpedSide Effects: generally has BM everyday, has not experienced constipation. Is having some nausea and headache.  Felt nauseated when he took a full 8mg  film.  Toxicology:  Latest Reference Range & Units 09/09/24 12:50 09/14/24 11:45 6-Acetylmorphine,Urine 10 ng/mL  Negative Amphetamines, Urine 300 ng/mL Negative Negative Benzodiazepines, Urine 50 ng/mL Negative Negative Buprenorphine, Urine 5 ng/mL Negative Negative Cocaine Metabolites, Urine 150 ng/mL Negative Negative [place]ed[place], Urine 100/50 ng/mL  Negative Creatinine, Urine >20.00 mg/dL 790.3 822.8 Dihydrocodeine, Urine 100/50 ng/mL  Negative ETG/ETS, Urine 100 ng/mL Negative Negative Fentanyl , Urine 1 ng/mL Negative Negative Hydrocodone, Urine 100/50 ng/mL  Negative Hydromorphone , Urine 100/50 ng/mL  Negative Marijuana Metabolite, Urine 20 ng/mL Negative Negative Morphine , Urine 100/50 ng/mL  Negative Nitrite,  Urine <200 mcg/mL 31.8 20.9 Norhydrocodone, Urine 100/50 ng/mL  Negative Noroxycodone, Urine 100/50 ng/mL  5,194.2 ! Opiates - Basic, Urine 100 ng/mL Negative See Below Oxycodone , Urine 100/50 ng/mL  2,101.6 ! Oxycodone /Oxymorphone, Urine 100 ng/mL Negative See Below Oxymorphone, Urine 100/50 ng/mL  419 ! pH, Urine 4.50 - 9.00  6.2 5.5 !: Data is abnormalReview of SystemsPertinent items are noted in HPI.BP 128/78   Pulse 73   Ht 1.803 m (5' 11)   Wt 101.2 kg (223 lb)   BMI 31.10 kg/m Current COWS: 0General Physical AssessmentGeneral Appearance: alert, appears stated age, well groomedEyes: pupils 3mm, no scleral icterusSkin: no sweating or piloerectionNeuro: thought process linear, oriented x 3, steady gaitCurrent Outpatient Medications on File Prior to Visit Medication Sig Dispense Refill  amLODIPine (NORVASC) 5 mg tablet Take 1 tablet (5 mg total) by mouth daily. 90 tablet 3  escitalopram (LEXAPRO) 5 mg tablet Take 1 tablet (5 mg total) by mouth daily. 30 tablet 5  cloNIDine (CATAPRES) 0.1 mg tablet Take 1 tablet (0.1 mg total) by mouth every 6 hours as needed (withdrawal). 28 tablet 0  blood pressure monitor, automatic with arm cuff Use as directed to monitor blood pressure (Patient not taking: Reported on 09/14/2024) 1 kit 0  omeprazole  (PRILOSEC) 40 mg capsule TAKE 1 CAPSULE BY MOUTH DAILY. 90 capsule 3  azelastine  (OPTIVAR ) 0.05 % ophthalmic solution INSTILL 1 DROP INTO BOTH EYES TWICE A DAY 18 mL 3  albuterol  HFA (PROVENTIL , VENTOLIN ) 108 (90 Base) MCG/ACT inhaler Inhale 1-2 puffs into the lungs every 4-6 hours as needed for Wheezing. 18 each 5  fluticasone  (FLONASE ) 50 MCG/ACT nasal spray Spray 1 spray into nostril daily. 16 g 6  loratadine  (CLARITIN ) 10 mg tablet Take 1 tablet (10 mg total) by mouth daily. 90 tablet 3  PAP  supplies Perform a mask fitting & provide all needed PAP replacement supplies. Duration: LifetimeReports the mask  does not stay on all night, straps seem wrong and the tube pops off the mask as well. 1 each 0  metFORMIN  (GLUCOPHAGE -XR) 500 mg 24 hr tablet Take 1 tablet (500 mg total) by mouth daily (with dinner). Swallow whole. Do not crush, break, or chew. 90 tablet 3  atorvastatin  (LIPITOR) 20 mg tablet Take 1 tablet (20 mg total) by mouth daily. 90 tablet 3  CPAP machine (HCPCS [phone]) ResMed Auto CPAP 5-18 cmH2O with pressure relief & all needed supplies. Duration: Lifetime 1 each 0  acetaminophen  (TYLENOL ) 500 mg tablet Take 2 tablets (1,000 mg total) by mouth 3 times daily (Patient not taking: Reported on 08/20/2024) 100 tablet 0 No current facility-administered medications on file prior to visit. ASSESSMENT OF RISK FOR SUICIDAL BEHAVIORChanges in risk for suicide from baseline Formulation of Risk and/or previous intake, including newly identified risk, if any:  None noneASSESSMENT OF RISK FOR VIOLENT BEHAVIORChanges in risk for violence from baseline Formulation of Risk and/or previous intake, including newly identified risk, if any: None noneMEDICAL DECISION MAKINGDiagnoses:  ICD-10-CM ICD-9-CM 1. Opioid use disorder, severe, on agonist therapy  F11.20 304.00 [name] is a 38 y.o. male  has a past medical history of Allergy, Anxiety, Asthma, Depression, and Pre-diabetes., Opioid use disorder, severe, dependence. Currently  taking suboxone daily without use of oxycodone , denies withdrawal and craving is being addressed. Felt nauseated with 8mg  TDD and would like to resume 2mg  BID; may benefit from slow dose titration to 6mg  TDD or 8mg  TDD but will reassess side effects at next visit. Also vaping and would like to trial NRT gum.Counseling/Coordination of Care:Risks and benefits of treatment options discussedSide effect management discussedRelapse prevention discussedTobacco Dependence discussed Plan:-resume 2mg  BID of suboxone ([facility]-0.1mg  clonidine QHS (does not need new rx)-start 2mg  nicotine gum PRN nicotine cravings-f/up 1 week with myself-UDS with bup levels today-PPD replaced today- return in 2 days; has - lexapro started last week by PCP- plans to continue; also not taking his DM meds regularly  - we discussed use of pill box for organization - he will talk to his PCP about consolidated his MH treatment with his suboxone treatment. Length of Visit: 23 min + additional 5 minutes for nicotine cessation Nyonna Hargrove, MDAddiction Medicine PhysicianStrong Recovery

## 2024-09-21 NOTE — Patient Instructions (Addendum)
-   Schedule EGD- We have ordered an imaging study for you. Please call the radiology department at [phone] to schedule this appointment. - Continue omeprazole - Dysphagia precautions discussed including: taking small bites when eating, alternate small bites with small sips, eat slowly and chewing food thoroughly. If food becomes obstructed and is unable to clear in a reasonable time, pt advised to seek ED evaluation

## 2024-09-21 NOTE — H&P (Signed)
 Patient: [name]: [id number] Date of brith: 05-25-1987Date of visit: 11/10/2025PCP: Evalene Care, MDChief Compliant: DysphagiaSubjective: I had the pleasure of seeing your patient, Keene Gilkey, in the outpatient gastroenterology/hepatology clinic. As you know, he is a 38 y.o. male with a PMHx of allergies, anxiety, asthma, depression who presents to GI clinic for dysphagia. History of Present IllnessPatient was recently seen by PCP on 09/15/2024 reporting dysphagia.  Today the patient tells me that dysphagia has been present for the past few years.  Dysphagia is described as the sensation of food getting stuck in his chest.  Symptoms occur weekly.  Symptoms are worse with the intake of pasta and burgers.  Denies dysphagia to liquids or pills.  He denies any regurgitation and food will typically go down on its own.  He also reports occasional abdominal bloating but is unable to identify dietary triggers. He is currently on omeprazole  40 mg daily and has been on this for the past few years.  He has a history of GERD like symptoms and reports they are well-controlled with omeprazole .Denies nausea, vomiting, unintentional weight loss, fever, chills, abdominal pain. Moving bowels daily. No recent change in bowel habits. Denies black or bloody stools. He has never had an EGD in the past.Medications: Current Medications[1]Allergies: Allergies[2]Physical Exam: Not completed as the visit was completed via videoGI Procedures: NoneLaboratory/Imaging Results: CTAP 03/28/2024 No CT evidence of acute abdominal or pelvic injury.     Additional trauma CT examinations reported separately.    END OF IMPRESSION  03/27/24 22:33 Sodium 139 Potassium 4.6 Chloride 102 CO2 19 (L) Anion Gap 18 (H) UN 23 (H) Creatinine 1.85 (H) eGFR BY CREAT 47 ! Glucose 124 (H) Calcium  10.2 Protime 12.7 INR 1.1  WBC 19.8 (H) RBC 5.2 Hemoglobin 13.7 Hematocrit 41 MCV 79 RDW 14.7 Platelets 340 Neut # K/uL 16.4 (H) Lymph # K/uL 2.0 Mono # K/uL 1.2 (H) Eos # K/uL 0.1 Baso # K/uL 0.1 IMM Granulocytes # 0.1 (H) Nucl RBC # K/uL 0.0 Seg Neut % 82.3 IMM Granulocytes 0.6 Nucl RBC % 0.0 Hold Green (no gel,not spun) HOLD TUBE  07/31/23 10:17 Sodium 141 Potassium 4.0 Chloride 103 CO2 27 Anion Gap 11 UN 12 Creatinine 1.06 eGFR BY CREAT 92 Glucose 74 Calcium  10.1 Cholesterol 173 Triglycerides 114 HDL Cholesterol 48 LDL Calculated 102 Non HDL Cholesterol 125 Chol/HDL Ratio 3.6 Hemoglobin A1C 5.9 (H) Assessment and Recommendations: [name] is 38 y.o. male with a PMHx of allergies, anxiety, asthma, depression who presents to GI clinic for dysphagia. Assessment & PlanPatient reporting dysphagia to solids.  Symptoms have been present for the past few years.  Dysphagia described as solid food getting stuck in his chest.  He has a history of GERD like symptoms well-controlled on omeprazole  40 mg daily. Differential diagnosis includes esophageal stricture (due to acid, EoE), carcinoma, esophageal web and rings, infectious esophagitis, medication esophagitis, reflux esophagitis,  motility disorder.  Recommend EGD for further evaluation.  Benefits and risks of procedure discussed with patient.  Recommend patient continue PPI therapy for now. Will check barium esophagram to assess for motility disorder, ideally this would occur before EGD but should not prolong timing of EGD.- Check barium esophagram with 13 mm tablet administration- Plan for EGD with GA within the next 4-6 weeks with esophageal and gastric biopsies- Continue PPI therapy with omeprazole  40 mg 30 minutes prior to first meal of the day- Dysphagia precautions discussed including: taking small bites when eating, alternate small bites with small sips, eat slowly and chewing food  thoroughly. If food becomes obstructed and is unable to clear in a reasonable time, pt advised to seek ED evaluationFollow-up per endoscopist findingsThank you for allowing us  to participate in this patient's care. Please do not hesitate to contact us  with any questions or concerns at [phone]. I personally spent 32 minutes on the calendar day of the encounter, including pre and post visit work.    Fairy LITTIE Salon, NPGastroenterology and HepatologyVideo Visit Mode of Communication with Patient for This Visit  Mode of Communication with Patient for This Visit: VideoPatient Location: HomeProvider Location: Home  Consent was obtained from the patient to complete this telemedicine visit; including the potential for financial liability.  [1] Current Outpatient Medications:   buprenorphine-naloxone  (SUBOXONE) 2-0.5 MG SL film, Place 1 film under the tongue 2 times daily for 8 days for Opioid Dependence. Max daily dose: 2 films, Disp: 16 film, Rfl: 0  amLODIPine (NORVASC) 5 mg tablet, Take 1 tablet (5 mg total) by mouth daily., Disp: 90 tablet, Rfl: 3  escitalopram (LEXAPRO) 5 mg tablet, Take 1 tablet (5 mg total) by mouth daily., Disp: 30 tablet, Rfl: 5  cloNIDine (CATAPRES) 0.1 mg tablet, Take 1 tablet (0.1 mg total) by mouth every 6 hours as needed (withdrawal)., Disp: 28 tablet, Rfl: 0  omeprazole  (PRILOSEC) 40 mg capsule, TAKE 1 CAPSULE BY MOUTH DAILY., Disp: 90 capsule, Rfl: 3  fluticasone  (FLONASE ) 50 MCG/ACT nasal spray, Spray 1 spray into nostril daily., Disp: 16 g, Rfl: 6  loratadine  (CLARITIN ) 10 mg tablet, Take 1 tablet (10 mg total) by mouth daily., Disp: 90 tablet, Rfl: 3  metFORMIN  (GLUCOPHAGE -XR) 500 mg 24 hr tablet, Take 1 tablet (500 mg total) by mouth daily (with dinner). Swallow whole. Do not crush, break, or chew., Disp: 90 tablet, Rfl: 3  atorvastatin  (LIPITOR) 20 mg tablet, Take 1 tablet (20 mg total) by mouth daily., Disp: 90  tablet, Rfl: 3  nicotine polacrilex (NICORETTE) 2 mg gum, Take 1 each (2 mg total) by mouth every 2 hours as needed for Smoking cessation for Nicotine Addiction. Max daily dose: 24 mg, Disp: 110 tablet, Rfl: 0  blood pressure monitor, automatic with arm cuff, Use as directed to monitor blood pressure (Patient not taking: Reported on 09/14/2024), Disp: 1 kit, Rfl: 0  azelastine  (OPTIVAR ) 0.05 % ophthalmic solution, INSTILL 1 DROP INTO BOTH EYES TWICE A DAY, Disp: 18 mL, Rfl: 3  albuterol  HFA (PROVENTIL , VENTOLIN ) 108 (90 Base) MCG/ACT inhaler, Inhale 1-2 puffs into the lungs every 4-6 hours as needed for Wheezing., Disp: 18 each, Rfl: 5  PAP supplies, Perform a mask fitting & provide all needed PAP replacement supplies. Duration: Lifetime Reports the mask does not stay on all night, straps seem wrong and the tube pops off the mask as well., Disp: 1 each, Rfl: 0  CPAP machine (HCPCS [phone]), ResMed Auto CPAP 5-18 cmH2O with pressure relief & all needed supplies. Duration: Lifetime, Disp: 1 each, Rfl: 0  acetaminophen  (TYLENOL ) 500 mg tablet, Take 2 tablets (1,000 mg total) by mouth 3 times daily (Patient not taking: Reported on 08/20/2024), Disp: 100 tablet, Rfl: 0[2] AllergiesAllergen Reactions  Environmental Allergies Rhinitis

## 2024-09-22 ENCOUNTER — Telehealth: Payer: Self-pay | Admitting: Mental Health

## 2024-09-22 NOTE — Telephone Encounter (Signed)
 Writer called and spoke with [employer].  Writer explained that he is calling, as he noticed that he made a scheduling mistake and inquired if [name] would be willing to meet at 9:30 on the 13th instead of 10 AM.  [name] was agreeable.

## 2024-09-23 LAB — STRONG RECOVERY OUTPATIENT PANEL, URINE (DRUGSCAN)
Amphetamines, Urine: NEGATIVE ng/mL
Benzodiazepines, Urine: NEGATIVE ng/mL
Cocaine Metabolites, Urine: NEGATIVE ng/mL
Creatinine, Urine: 95.3 mg/dL (ref >20.00)
ETG/ETS, Urine: NEGATIVE ng/mL
Fentanyl, Urine: NEGATIVE ng/mL
Marijuana Metabolite, Urine: NEGATIVE ng/mL
Nitrite, Urine: 25.4 ug/mL (ref <200)
Opiates - Basic, Urine: NEGATIVE ng/mL
Oxycodone/Oxymorphone, Urine: NEGATIVE ng/mL
pH, Urine: 7.1 (ref 4.50–9.00)

## 2024-09-23 LAB — BUPRENORPHINE & NALOXONE, URINE (DRUGSCAN)
Buprenorphine, Urine: 16.4 ng/mL — AB
Naloxone, Urine: 42.8 ng/mL — AB
Norbuprenorphine, Urine: 63 ng/mL — AB

## 2024-09-23 LAB — BUPRENORPHINE CREATININE RATIO,URINE(DRUGSCAN): Buprenorphine/Creatinine Ratio,Urine: 17 ng/mg

## 2024-09-23 LAB — NORBUPRENORPHINE CREATININE RATIO, URINE (DRUGSCAN): Norbuprenorphine/Creatinine Ratio, Urine: 66 ng/mg

## 2024-09-23 NOTE — Progress Notes (Signed)
 Intake -Stabilization Team MeetingNovember 11, 2025Present: Annabelle Bunnell, MSN, PMHNP-BC; Clotilda Mash, BS, CASAC; Lauren Smith-Friedman, LMSW-CASAC; Janine Coppini, [facility], NCC, CASAC; [employer], LCSW-R, CASAC; [name], MHC, NCC, CASAC-T; Darice Dooms, MS, CASAC; Laymon Cornea, RN; Karolynn Prader, RN; Kelly Rinks, MS, CASAC; Ayodele Ippolito, PA; Jade Malcho, MD; Clarita Grieves, MFA, MS, [facility]; Debby Clause, CRPA; Gerlene Carder, CRPA; Darryle Abu, ATR-BC, LCAT, CASAC; Lyle Curry, MHCTerrence Bachtel [id number] ALD: Patient met with Dr. Casimir on 09/21/24 it appears Patient is getting close to completing Stabilization.  Writer will discuss Patient's readiness to complete stabilization during appointment on 09/24/24. He expressed an interest in continuing in individual therapy. Doing well on Suboxone.Disposition: Meet with Dr. Casimir on 09/28/24. Consider working with Terry Salon for continuation of Suboxone.

## 2024-09-24 ENCOUNTER — Other Ambulatory Visit: Payer: Self-pay

## 2024-09-24 ENCOUNTER — Ambulatory Visit: Payer: Self-pay | Admitting: Mental Health

## 2024-09-24 DIAGNOSIS — F1120 Opioid dependence, uncomplicated: Secondary | ICD-10-CM

## 2024-09-24 NOTE — H&P (Signed)
 PPD Reading Note  PPD read and results entered in EpicCare.  Result: 0 mm induration.  Interpretation: negative  Allergic reaction: no

## 2024-09-28 ENCOUNTER — Ambulatory Visit: Payer: Self-pay | Admitting: Emergency Medicine

## 2024-09-28 ENCOUNTER — Other Ambulatory Visit: Payer: Self-pay

## 2024-09-28 VITALS — BP 119/73 | HR 76 | Ht 71.0 in | Wt 223.0 lb

## 2024-09-28 DIAGNOSIS — F1120 Opioid dependence, uncomplicated: Secondary | ICD-10-CM

## 2024-09-28 MED ORDER — BUPRENORPHINE HCL-NALOXONE HCL 4-1 MG SL FILM *I*
1.0000 | ORAL_FILM | Freq: Two times a day (BID) | SUBLINGUAL | 0 refills | Status: DC
Start: 2024-09-28 — End: 2024-10-07
  Filled 2024-09-28: qty 18, 9d supply, fill #0

## 2024-09-28 NOTE — Progress Notes (Signed)
 Stabilization Services Provider Daily Note Chief Complaint/Reason for Encounter: Opioid use disorder, severe, dependence/Stabilization Day 15HPI: [name] is a 38 y.o. male with OUD who started stabalization program  on 11/3; has been taking 2mg  BID as prescribed, last dose last evening. Denies use of other opioids, is having break through cravings throughout the evening and thinks a higher dose would be beneficial for him. He is not having any other nausea, constipation or feeling sedated.Tried the nicotine gum 1-2x last week; when he was really stressed this weekend he found himself vaping more. Reports stressors with his significant other who was drinking a lot over the weekend. Has a f/up with his PCP not until another 6 months, but has been taking his 5mg  lexapro most days. Subjective report of symptoms: Sleep: sleeping OK, waking up rested.  Anxiety, depression: feeling generally down but feels it is situational with his significant other; taking lexapro as aboveCravings: mostly at nightSide Effects: denies constipation or nausea.   Felt nauseated when he took a full 8mg  film before last visitToxicology:  Latest Reference Range & Units 09/09/24 12:50 09/14/24 11:45 09/21/24 09:31 6-Acetylmorphine,Urine 10 ng/mL  Negative  Amphetamines, Urine 300 ng/mL Negative Negative Negative Benzodiazepines, Urine 50 ng/mL Negative Negative Negative Buprenorphine, Urine 5 ng/mL5 ng/mL Negative Negative See Below16.4 ! Buprenorphine/Creatinine Ratio,Urine ng/mg   17 Cocaine Metabolites, Urine 150 ng/mL Negative Negative Negative Coedine, Urine 100/50 ng/mL  Negative  Creatinine, Urine >20.00 mg/dL 790.3 822.8 04.6 Dihydrocodeine, Urine 100/50 ng/mL  Negative  ETG/ETS, Urine 100 ng/mL Negative Negative Negative Fentanyl , Urine 1 ng/mL Negative Negative Negative Hydrocodone, Urine 100/50 ng/mL  Negative  Hydromorphone , Urine 100/50 ng/mL  Negative   Marijuana Metabolite, Urine 20 ng/mL Negative Negative Negative Morphine , Urine 100/50 ng/mL  Negative  Naloxone , Urine 5 ng/mL   42.8 ! Nitrite, Urine <200 mcg/mL 31.8 20.9 25.4 Norbuprenorphine, Urine 5 ng/mL   63 ! Norbuprenorphine/Creatinine Ratio, Urine ng/mg   66 Norhydrocodone, Urine 100/50 ng/mL  Negative  Noroxycodone, Urine 100/50 ng/mL  5,194.2 !  Opiates - Basic, Urine 100 ng/mL Negative See Below Negative Oxycodone , Urine 100/50 ng/mL  2,101.6 !  Oxycodone /Oxymorphone, Urine 100 ng/mL Negative See Below Negative Oxymorphone, Urine 100/50 ng/mL  419 !  pH, Urine 4.50 - 9.00  6.2 5.5 7.1 !: Data is abnormalReview of SystemsPertinent items are noted in HPI.BP 119/73 (BP Location: Left arm, Patient Position: Sitting, Cuff Size: adult)   Pulse 76   Ht 1.803 m (5' 11)   Wt 101.2 kg (223 lb)   BMI 31.10 kg/m General Physical AssessmentGeneral Appearance: alert, appears stated age, well groomedEyes: pupils 3mm, no scleral icterusSkin: no sweating or piloerectionNeuro: thought process linear, oriented x 3, steady gaitCurrent Outpatient Medications on File Prior to Visit Medication Sig Dispense Refill  buprenorphine-naloxone  (SUBOXONE) 2-0.5 MG SL film Place 1 film under the tongue 2 times daily for 8 days for Opioid Dependence. Max daily dose: 2 films 16 film 0  nicotine polacrilex (NICORETTE) 2 mg gum Take 1 each (2 mg total) by mouth every 2 hours as needed for Smoking cessation for Nicotine Addiction. Max daily dose: 24 mg 110 tablet 0  amLODIPine (NORVASC) 5 mg tablet Take 1 tablet (5 mg total) by mouth daily. 90 tablet 3  escitalopram (LEXAPRO) 5 mg tablet Take 1 tablet (5 mg total) by mouth daily. 30 tablet 5  cloNIDine (CATAPRES) 0.1 mg tablet Take 1 tablet (0.1 mg total) by mouth every 6 hours as needed (withdrawal). 28 tablet 0  omeprazole  (PRILOSEC) 40 mg  capsule TAKE 1 CAPSULE BY MOUTH DAILY. 90 capsule 3  azelastine   (OPTIVAR ) 0.05 % ophthalmic solution INSTILL 1 DROP INTO BOTH EYES TWICE A DAY 18 mL 3  albuterol  HFA (PROVENTIL , VENTOLIN ) 108 (90 Base) MCG/ACT inhaler Inhale 1-2 puffs into the lungs every 4-6 hours as needed for Wheezing. 18 each 5  fluticasone  (FLONASE ) 50 MCG/ACT nasal spray Spray 1 spray into nostril daily. 16 g 6  loratadine  (CLARITIN ) 10 mg tablet Take 1 tablet (10 mg total) by mouth daily. 90 tablet 3  PAP supplies Perform a mask fitting & provide all needed PAP replacement supplies. Duration: LifetimeReports the mask does not stay on all night, straps seem wrong and the tube pops off the mask as well. 1 each 0  metFORMIN  (GLUCOPHAGE -XR) 500 mg 24 hr tablet Take 1 tablet (500 mg total) by mouth daily (with dinner). Swallow whole. Do not crush, break, or chew. 90 tablet 3  atorvastatin  (LIPITOR) 20 mg tablet Take 1 tablet (20 mg total) by mouth daily. 90 tablet 3  CPAP machine (HCPCS [phone]) ResMed Auto CPAP 5-18 cmH2O with pressure relief & all needed supplies. Duration: Lifetime 1 each 0  blood pressure monitor, automatic with arm cuff Use as directed to monitor blood pressure (Patient not taking: Reported on 09/28/2024) 1 kit 0  acetaminophen  (TYLENOL ) 500 mg tablet Take 2 tablets (1,000 mg total) by mouth 3 times daily (Patient not taking: Reported on 08/20/2024) 100 tablet 0 No current facility-administered medications on file prior to visit. Istop reviewed 11/17:RX Written RX Dispensed Drug Quantity Days Supply Prescriber Name Payment Method Dispenser PDI11/08/2024 09/21/2024 buprenorphine-naloxone  2-0.5 mg sl film 16 8 Kvon Mcilhenny J Other [facility] Patient_111/01/2024 09/14/2024 buprenorphine-naloxone  2-0.5 mg sl film 8 2 Bereket Gernert J Other [facility] Patient_211/01/2024 09/14/2024 buprenorphine-naloxone  8-2 mg sl film 4 2 Fransisca Shawn J Other [facility] Patient_305/17/2025 03/28/2024 oxycodone  hcl (ir) 5 mg tablet 13 3 Beamon  Scott, [place] Other [facility] Patient_4 ASSESSMENT OF RISK FOR SUICIDAL BEHAVIORChanges in risk for suicide from baseline Formulation of Risk and/or previous intake, including newly identified risk, if any:  None noneASSESSMENT OF RISK FOR VIOLENT BEHAVIORChanges in risk for violence from baseline Formulation of Risk and/or previous intake, including newly identified risk, if any: None noneMEDICAL DECISION MAKINGDiagnoses:  ICD-10-CM ICD-9-CM 1. Opioid use disorder, severe, dependence  F11.20 304.00 Tyce Delcid is a 38 y.o. male  has a past medical history of Allergy, Anxiety, Asthma, Depression, and Pre-diabetes., Opioid use disorder, severe, dependence. Currently  taking suboxone daily without use of oxycodone , denies withdrawal and craving is being addressed. Felt nauseated with 8mg  TDD and would like to resume 2mg  BID; may benefit from slow dose titration to 6mg  TDD or 8mg  TDD but will reassess side effects at next visit. Also vaping and would like to trial NRT gum.Counseling/Coordination of Care:Risks and benefits of treatment options discussedSide effect management discussedRelapse prevention discussedTobacco Dependence discussed Plan:-increase to 4mg  BID of suboxone - dispensed 18 films for next 9 days-continue 2mg  nicotine gum PRN nicotine cravings-UDS with bup levels today (very low on 11/10- but could be due to recent and low dose initiation)-f/up with Del for ongoing buprenorphine MOUD maintainaince  11/25 @ 730AMLength of Visit: 21 min + additional 5 minutes for nicotine cessation Callee Rohrig, MDAddiction Medicine PhysicianStrong Recovery

## 2024-09-28 NOTE — Progress Notes (Signed)
 [facility] Individual Progress Note Session Duration: 28 minutesContact Type:  Individual PsychotherapyLast Toxicology Screen Date: 11/10/25Result of last toxicology screen: Positive for SuboxoneReason for completing toxicology screening: Medication Safety, Medication AdherenceLast reported/recorded use: Patient reports having maintained abstinence from Opioids, as well as abstinence from Alcohol.Results discussed with patient YesActions Based on Toxicology Report: See belowWas the session conducted in the patient's preferred language? Yes  If yes, what is the preferred language? EnglishIf the patient is bilingual, did the patient prefer to conduct the session in English and demonstrate understanding? N/ATreatment/Recovery Goals Addressed:See BelowColumbia Suicide Severity Rating Scale Calculated C-SSRS Risk Score (Lifetime/Recent) 09/09/2024 No Risk Indicated  No change in risk status or risk state since last assessment.Brief Obsessive Compulsive Scale-Self Report (BOCS-SR OCD)  Not Clinically Relevant   No data to display    Domestic Violence Screen:No indication to update domestic violence screen.Session ContentTreatment plan reviewed with patientEvidence-based or Clinical Intervention Used:Cognitive Behavioral TherapyMedication Supported RecoveryIndividual Drug CounselingCCBHC Suicide Specific Intervention:N/ASession summary/response to session: Clinical Impression:The patient is nearing the completion of the Suboxone stabilization phase and reports that the medication has been effective in reducing cravings and managing triggering situations. He described a recent conflict with his wife, which he successfully navigated without resorting to substance use, indicating progress in his coping mechanisms. He continues to work and stay focused on maintaining sobriety. The patient also mentioned that this  month is particularly challenging due to the anniversary of his father's death, but he has been able to manage his emotions and triggers with the help of Suboxone.Therapeutic Intervention:During the session, cognitive-behavioral strategies were employed to help the patient identify and manage triggers. Psychoeducation was provided on the importance of recognizing and addressing emotional triggers, especially during significant dates. The patient was encouraged to continue using self-talk and other coping mechanisms to manage stress and avoid substance use.Was the [facility] assessment completed? No: Plan- Continue Suboxone as prescribed.- Practice self-care techniques, including mindfulness exercises and self-talk.- Engage in healthy distractions and supportive activities, especially during emotionally challenging times.Follow-up:The patient is scheduled for a follow-up visit on 10/01/2024 at 9:00 AM. The session will include a comprehensive assessment focusing on family history, other relevant factors, and the establishment of treatment goals.Note: This note Contains text generated by [employer] Cop[employer]   Though some attempt at proofreading has been made, some transcription errors may remain.   Current Treatment Plan [facility] Stabilization Treatment Plan Stabilization Treatment Plan  Date 09/14/24-11/13/24 Strengths Health, coping skills, education, employment, family supports, future aspirations and motivation. Challenges Substance use, mental health Patient Identified SUD Problem(s) It's created financial and emotional problems. Patient Identified SUD Goal(s) 1)  "I will attend stabilization appointments as recommended"2) "I will take medications as prescribed"3) "I will identify my aftercare plan" Treatment Interventions Provide safe and effective medication management for symptoms of withdrawalDemonstrate adherence to medication management/safety and aftercare  planningCoordinate linkage with appropriate medical and counseling services consistent with the stated desire of the patient's recovery planIndividual counseling with Franky Baelynn Schmuhl, [facility], CASAC for 15-60 minutes every 1x week or as clinically indicatedIndividual sessions with Dr. Richie provider 1-3x per week to reach a therapeutic dose or as neededClinic Crisis Services as neededTransition Planning Individual Counseling with Franky Kaytelyn Glore Current pregnancy N/A Communicable diseases N/A Medical/Health concerns Pre-diaberes Tobacco/Nicotine Will discuss with Patient at next opportunity. Medication for SUD Buprenorphine 2-0.5 MG SL film 8 film 8-2 MG SL film 4 film  Discharge Criteria for this treatment setting When I've learned how to control my emotions without using.   (Please  complete the Domestic Abuse and Neglect screening question on the Screening Navigator during treatment plan review session)

## 2024-09-29 NOTE — Progress Notes (Signed)
 Alaric completed the Social Determinants of Health (SDOH) questionnaire. Please see the Social Determinants of Health survey outcomes located in the LPOC tab for additional SDOH details. Writer will follow up with [facility] Outreach team regarding referral needs if any are indicated and/or requested at time of services.

## 2024-09-30 LAB — BUPRENORPHINE & NALOXONE, URINE (DRUGSCAN)
Buprenorphine, Urine: 88 ng/mL — AB
Naloxone, Urine: 100.9 ng/mL — AB
Norbuprenorphine, Urine: 204.5 ng/mL — AB

## 2024-09-30 LAB — BUPRENORPHINE CREATININE RATIO,URINE(DRUGSCAN): Buprenorphine/Creatinine Ratio,Urine: 33 ng/mg

## 2024-09-30 LAB — STRONG RECOVERY OUTPATIENT PANEL, URINE (DRUGSCAN)
Amphetamines, Urine: NEGATIVE ng/mL
Benzodiazepines, Urine: NEGATIVE ng/mL
Cocaine Metabolites, Urine: NEGATIVE ng/mL
Creatinine, Urine: 263.3 mg/dL (ref >20.00)
ETG/ETS, Urine: NEGATIVE ng/mL
Fentanyl, Urine: NEGATIVE ng/mL
Marijuana Metabolite, Urine: NEGATIVE ng/mL
Nitrite, Urine: 27.1 ug/mL (ref <200)
Opiates - Basic, Urine: NEGATIVE ng/mL
Oxycodone/Oxymorphone, Urine: NEGATIVE ng/mL
pH, Urine: 5.7 (ref 4.50–9.00)

## 2024-09-30 LAB — NORBUPRENORPHINE CREATININE RATIO, URINE (DRUGSCAN): Norbuprenorphine/Creatinine Ratio, Urine: 77 ng/mg

## 2024-09-30 NOTE — Progress Notes (Signed)
 Intake -Stabilization Team MeetingNovember 18, 2025 Present: Eve Andersen-Buescher, MSN, PMHNP-BC; Tinnie Smith-Friedman, LMSW-CASAC; Janine Coppini, [facility], NCC, CASAC; [employer], LCSW-R, CASAC; [name], MHC, NCC, CASAC-T; Darice Dooms, MS, CASAC; Kelly Rinks, MS, CASAC; Leone Salon, PA; Jade Malcho, MD; Chalmer Ashworth, RN; Marylynn Nine, BS; Catheryn Curet, MS, CASAC; Lyle Curry, [facility]; Geraline Savoy, RN; Clarita Grieves, MFA, MS, MHC; Darryle Abu, ATR-BC, LCAT, CASAC; Debby Clause, CRPA; Gerlene Carder, CRPA; Nash Parkinson, MHCTerrence Mathes [id number] ALD Bernerd): Patient has completed stabilization is being transitioned to ALD.  Patient will complete comprehensive assessment with primary therapist on 10/01/24.  Patient will engage with Ayodele Ippolito, PA for continuation of Suboxone .Disposition: Approved for successful completion of Stabilization; will continue in ALD with outpatient services.

## 2024-10-01 ENCOUNTER — Ambulatory Visit: Payer: Self-pay | Admitting: Psychiatry

## 2024-10-01 ENCOUNTER — Encounter: Payer: Self-pay | Admitting: Psychiatry

## 2024-10-01 ENCOUNTER — Ambulatory Visit: Payer: Self-pay | Admitting: Mental Health

## 2024-10-01 ENCOUNTER — Telehealth: Payer: Self-pay | Admitting: Mental Health

## 2024-10-01 NOTE — Telephone Encounter (Signed)
 Writer called and spoke with [employer].  Writer explained that he is calling because Mackenzy missed his appointment with clinical research associate.  Dwaine apoligized and explained that he forgot.  Writer reminded Avin that he has an appointment scheduled at [facility] at 11 AM.  [name] shared that he will need to call to reschedule that appointment as he had to open at work today.  Darin was agreeable to rescheduling and accepted an appointment for 10/05/24 at 8:00 AM.

## 2024-10-05 ENCOUNTER — Ambulatory Visit: Payer: Self-pay | Attending: Psychology | Admitting: Mental Health

## 2024-10-05 ENCOUNTER — Other Ambulatory Visit: Payer: Self-pay

## 2024-10-05 DIAGNOSIS — F1120 Opioid dependence, uncomplicated: Secondary | ICD-10-CM | POA: Insufficient documentation

## 2024-10-05 DIAGNOSIS — F1020 Alcohol dependence, uncomplicated: Secondary | ICD-10-CM | POA: Insufficient documentation

## 2024-10-05 DIAGNOSIS — F1420 Cocaine dependence, uncomplicated: Secondary | ICD-10-CM | POA: Insufficient documentation

## 2024-10-05 DIAGNOSIS — F17200 Nicotine dependence, unspecified, uncomplicated: Secondary | ICD-10-CM | POA: Insufficient documentation
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
## 2024-10-06 ENCOUNTER — Ambulatory Visit

## 2024-10-06 ENCOUNTER — Ambulatory Visit: Payer: Self-pay

## 2024-10-06 VITALS — BP 116/72 | HR 68 | Ht 71.0 in | Wt 222.0 lb

## 2024-10-06 DIAGNOSIS — F1120 Opioid dependence, uncomplicated: Secondary | ICD-10-CM

## 2024-10-06 NOTE — Progress Notes (Addendum)
 Patient apologetic for tardiness and notes that he has to be in [place] at 9:00 am for [address]. Discussed that rescheduling to Wednesday would be appropriate as clinical research associate cannot conduct & complete appointment in 10 minutes. Patient has enough suboxone  until appointment tomorrow. BP 116/72   Pulse 68   Ht 1.803 m (5' 11)   Wt 100.7 kg (222 lb)   BMI 30.96 kg/m Shi Blankenship C Kymberlee Viger, PA8:19 AM

## 2024-10-07 ENCOUNTER — Ambulatory Visit

## 2024-10-07 ENCOUNTER — Other Ambulatory Visit: Payer: Self-pay | Admitting: Internal Medicine

## 2024-10-07 ENCOUNTER — Other Ambulatory Visit: Payer: Self-pay

## 2024-10-07 DIAGNOSIS — K5903 Drug induced constipation: Secondary | ICD-10-CM

## 2024-10-07 DIAGNOSIS — G44229 Chronic tension-type headache, not intractable: Secondary | ICD-10-CM

## 2024-10-07 DIAGNOSIS — T402X5A Adverse effect of other opioids, initial encounter: Secondary | ICD-10-CM

## 2024-10-07 DIAGNOSIS — F1120 Opioid dependence, uncomplicated: Secondary | ICD-10-CM

## 2024-10-07 MED ORDER — BUPRENORPHINE HCL-NALOXONE HCL 4-1 MG SL FILM *I*
1.0000 | ORAL_FILM | Freq: Two times a day (BID) | SUBLINGUAL | 0 refills | Status: DC
  Filled 2024-10-07 (×2): qty 56, 28d supply, fill #0

## 2024-10-07 MED ORDER — POLYETHYLENE GLYCOL 3350 PO POWD *I*
17.0000 g | Freq: Every day | ORAL | 5 refills | Status: AC
Start: 2024-10-07 — End: 2025-01-05
  Filled 2024-10-07: qty 238, 14d supply, fill #0
  Filled 2024-10-07: qty 476, 28d supply, fill #0

## 2024-10-07 MED ORDER — POLYETHYLENE GLYCOL 3350 PO POWD *I*
17.0000 g | Freq: Every day | ORAL | 5 refills | Status: DC
Start: 2024-10-07 — End: 2024-10-07
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
## 2024-10-08 ENCOUNTER — Other Ambulatory Visit: Payer: Self-pay | Admitting: Internal Medicine

## 2024-10-08 DIAGNOSIS — R7303 Prediabetes: Secondary | ICD-10-CM

## 2024-10-15 ENCOUNTER — Other Ambulatory Visit: Payer: Self-pay

## 2024-10-15 ENCOUNTER — Ambulatory Visit: Payer: Self-pay | Attending: Psychology | Admitting: Mental Health

## 2024-10-15 ENCOUNTER — Encounter: Payer: Self-pay | Admitting: Mental Health

## 2024-10-15 DIAGNOSIS — F1120 Opioid dependence, uncomplicated: Secondary | ICD-10-CM | POA: Insufficient documentation

## 2024-10-15 DIAGNOSIS — K5903 Drug induced constipation: Secondary | ICD-10-CM | POA: Insufficient documentation

## 2024-10-15 DIAGNOSIS — Z1389 Encounter for screening for other disorder: Secondary | ICD-10-CM | POA: Insufficient documentation

## 2024-10-15 DIAGNOSIS — F1020 Alcohol dependence, uncomplicated: Secondary | ICD-10-CM | POA: Insufficient documentation

## 2024-10-15 DIAGNOSIS — T402X5D Adverse effect of other opioids, subsequent encounter: Secondary | ICD-10-CM | POA: Insufficient documentation

## 2024-10-15 DIAGNOSIS — F17200 Nicotine dependence, unspecified, uncomplicated: Secondary | ICD-10-CM | POA: Insufficient documentation

## 2024-10-15 DIAGNOSIS — F1420 Cocaine dependence, uncomplicated: Secondary | ICD-10-CM | POA: Insufficient documentation

## 2024-10-15 NOTE — Progress Notes (Incomplete)
 Family: We're struggling with communication and expressiing our emoitons.I want to us  to be able to communicate better and deal with our emoticons better.

## 2024-10-15 NOTE — Care Coordination Plan (Signed)
 [facility] Referral  Patient Name: [name]: [id number] [facility] Admission Date: 09/14/2024 Referring Clinician: Franky Mckenzye Cutright, [facility], CASACProgram (ALD, AYA, OTP, SUD-PC):  SUD-PC/IRTReferral is for patient, parents only, patient and significant other, etc: Patient and significant otherReason for Referral in patient's words: We're struggling with communication and expressiing our emoitons. Current engagement level in treatment: Positively engagedCurrent/previous engagement in [facility] or Parenting Skills group?: NoAssignment considerations / Availability: [name] has preference for mid-morning appointments. Please note that a valid full consent is required for any action to be taken on this referral

## 2024-10-19 ENCOUNTER — Encounter: Payer: Self-pay | Admitting: Mental Health
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
## 2024-10-22 ENCOUNTER — Other Ambulatory Visit: Payer: Self-pay

## 2024-10-23 ENCOUNTER — Ambulatory Visit: Payer: Self-pay | Admitting: Mental Health

## 2024-10-23 ENCOUNTER — Telehealth: Payer: Self-pay | Admitting: Mental Health

## 2024-10-23 NOTE — Progress Notes (Signed)
 Error

## 2024-10-23 NOTE — BH Discharge Summary (Signed)
 Program transferring from: ALD Stabilization Last date of service: 11/17/2025Program transferring to: ALD OutpatientDate of admission/first appointment in new program: 11/24/2025Primary counselor: Franky Stakes Treatment Plan Due (29 days from first appointment in new program): 12/23/2025If transferring from OTP to ALD, primary care screen must be scheduled with nursing.

## 2024-10-23 NOTE — Telephone Encounter (Signed)
 Writer called [name] after he did not maintain his scheduled appointment.  Writer explained that he is calling to follow-up on missed appointment.  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  [name] was agreeable to rescheduling and accepted an appointment for 10/28/2024 at 9:00 AM.

## 2024-10-28 ENCOUNTER — Ambulatory Visit: Payer: Self-pay | Attending: Psychology | Admitting: Mental Health

## 2024-10-28 ENCOUNTER — Other Ambulatory Visit: Payer: Self-pay

## 2024-10-28 ENCOUNTER — Encounter: Payer: Self-pay | Admitting: Psychiatry

## 2024-10-28 DIAGNOSIS — F1120 Opioid dependence, uncomplicated: Secondary | ICD-10-CM | POA: Insufficient documentation
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
## 2024-10-28 NOTE — Progress Notes (Signed)
 Clinical Management Note Pre-admit case closed; did not re-engage by deadline. Advised to maintain engagement with CD treatment at [facility], engage with [facility] when ready.

## 2024-10-30 NOTE — Progress Notes (Signed)
 [facility] Individual Progress Note Patient's language: English  Session Duration: 47 minutesContact Type:  Individual PsychotherapyLast Toxicology Screen Date: 11/17/25Result of last toxicology screen: Positive for SuboxoneReason for completing toxicology screening: Random Last reported/recorded use: Patient reports having maintained abstinence from Opioids, as well as abstinence from Alcohol . Results discussed with patient NoActions Based on Toxicology Report: writer will administer a toxicology screen during next appointment.Treatment/Recovery Goals Addressed:No goals foundColumbia Suicide Severity Rating Scale Calculated C-SSRS Risk Score (Lifetime/Recent) 09/09/2024 No Risk Indicated  No change in risk status or risk state since last assessment.Brief Obsessive Compulsive Scale-Self Report (BOCS-SR OCD)  Not Clinically Relevant  10/05/2024   8:39 AM BOCS-SR OCD SCORE ONLY Total # of checklist items endorsed 1 List of top three symptom numbers endorsed -- Sum of the five severity questions -- Total # of checklist items endorsed 1 Domestic Violence Screen:No indication to update domestic violence screen.Session ContentTreatment plan reviewed with patientEvidence-based or Clinical Intervention Used:Cognitive Behavioral TherapyMedication Supported RecoveryIndividual Drug CounselingCCBHC Interventions for Zero Suicide Project:N/ASession summary/response to session: Clinical Impression:The patient is experiencing significant stress due to multiple personal and financial issues, including vehicle problems, bills, and family responsibilities. He reports that his current medication regimen is helping with cravings. Additionally, he is dealing with insomnia, often waking up in the middle of the night, which he attributes to stress. Despite these challenges, he is making efforts to manage his stress and  maintain his mental health. Therapeutic Intervention:During the session, cognitive-behavioral strategies were employed to address the patient's stress and insomnia. Psychoeducation was provided on the importance of maintaining a consistent sleep schedule. Mindfulness exercises were introduced to help the patient manage stress and improve sleep quality. Plan:- Maintain a consistent sleep schedule.- Engage in activities that honor deceased family members during the holidays, such as cooking their recipes.Was the [facility] assessment completed? No: Working DiagnosisDiagnoses Code Name Primary?  F11.20 Opioid use disorder, severe, on agonist therapy Yes PlanTerrence was on time for scheduled appointment.  [name] was engaged in discussion throughout the duration of the appointment.  [name] was agreeable and receptive to interventions.  Next appointment with writer is scheduled for 11/04/2024 at 12:00 PM.  Next Provider appointment is scheduled for 11/04/2024 at 11:30 AM.Note: This note Contains text generated by [employer]   Though some attempt at proofreading has been made, some transcription errors may remain.   Current Treatment Plan (Please complete the Domestic Abuse and Neglect screening question on the Screening Navigator during treatment plan review session)

## 2024-11-04 ENCOUNTER — Ambulatory Visit: Payer: Self-pay | Admitting: Mental Health

## 2024-11-04 ENCOUNTER — Encounter: Payer: Self-pay | Admitting: Mental Health

## 2024-11-04 ENCOUNTER — Other Ambulatory Visit: Payer: Self-pay | Admitting: Psychology

## 2024-11-04 ENCOUNTER — Other Ambulatory Visit: Payer: Self-pay | Admitting: Gastroenterology

## 2024-11-04 ENCOUNTER — Ambulatory Visit: Payer: Self-pay

## 2024-11-04 ENCOUNTER — Other Ambulatory Visit: Payer: Self-pay

## 2024-11-04 DIAGNOSIS — F1120 Opioid dependence, uncomplicated: Secondary | ICD-10-CM

## 2024-11-04 MED ORDER — BUPRENORPHINE HCL-NALOXONE HCL 4-1 MG SL FILM *I*: 1.0000 | ORAL_FILM | Freq: Three times a day (TID) | SUBLINGUAL | 0 refills | Status: DC

## 2024-11-04 NOTE — Progress Notes (Signed)
 [facility] Individual Progress Note Patient's language: English  Session Duration: 25 minutesContact Type:  Individual PsychotherapyLast Toxicology Screen Date: 11/17/25Result of last toxicology screen: Positive for SuboxoneReason for completing toxicology screening: Random Last reported/recorded use: Patient reports his last use of Opioids continues to be 09/13/24.  Patient also reports maintaining abstinence from Alcohol . Results discussed with patient NoActions Based on Toxicology Report: see belowTreatment/Recovery Goals Addressed:[place] Suicide Severity Rating Scale Calculated C-SSRS Risk Score (Lifetime/Recent) 09/09/2024 No Risk Indicated  No change in risk status or risk state since last assessment.Brief Obsessive Compulsive Scale-Self Report (BOCS-SR OCD)  Not Clinically Relevant  10/05/2024   8:39 AM BOCS-SR OCD SCORE ONLY Total # of checklist items endorsed 1 List of top three symptom numbers endorsed -- Sum of the five severity questions -- Total # of checklist items endorsed 1 Domestic Violence Screen:No indication to update domestic violence screen.Session ContentTreatment plan reviewed with patientEvidence-based or Clinical Intervention Used:Cognitive Behavioral TherapyMedication Supported RecoveryIndividual Drug CounselingCCBHC Interventions for Zero Suicide Project:N/ASession summary/response to session: Interim History: He reports experiencing significant stress, which has been disrupting his sleep. He describes feelings of low mood, irritability, and sleep disturbances. He also mentions a sense of paranoia, feeling as though others are plotting against him. He expresses a desire to alleviate some of his stressors and improve his overall mood. He is considering seeking help from a family counselor and is open to receiving a referral for this service. He is also contemplating ways to  honor the memory of his deceased father and grandmother during the upcoming holiday season. Clinical Impression:The patient has reported significant stress related to financial difficulties, vehicle issues, and holiday pressures. Despite these stressors, he has maintained sobriety for almost two months since starting Suboxone  on 09/14/2024, with only one reported slip-up. He reports that the medication is effective. The patient also experiences symptoms of anger, paranoia, depression, and grief, which are impacting his overall mental health. He has expressed a willingness to learn financial management skills and is open to receiving additional mental health support.Therapeutic Intervention:During the session, cognitive-behavioral strategies were employed to help the patient identify and challenge negative thought patterns contributing to his stress and anxiety. Psychoeducation was provided on the importance of financial management and budgeting. The patient was also encouraged to engage in mindfulness exercises to manage stress and improve emotional regulation.Plan identified with Patient:- Referral to a targeted case manager/psych rehab therapist for financial management and budgeting assistance.- Referral for embedded mental health services to address anger, paranoia, depression, and grief.- Conduct a urine drug screen today.- Encourage the patient to practice mindfulness exercises daily.Was the [facility] assessment completed? YesWorking DiagnosisDiagnoses Code Name Primary?  F11.20 Opioid use disorder, severe, on agonist therapy Yes PlanThe next appointment is scheduled for 11/18/2024. Goals for the session include reviewing progress with financial management, discussing any challenges with maintaining sobriety, and addressing any ongoing mental health symptoms.Note: This note Contains text generated by [employer]   Though some attempt at proofreading has been made,  some transcription errors may remain.   Current Treatment Plan (Please complete the Domestic Abuse and Neglect screening question on the Screening Navigator during treatment plan review session)

## 2024-11-04 NOTE — BH OP Treatment Plan (Signed)
 DISCHARGE CRITERIA for this treatment setting: When I feel better about myself and have more confidence in myself to move forward.

## 2024-11-04 NOTE — BH OP Treatment Plan (Signed)
 Strengths & Challenges  11/04/2024 Strengths & Challenges Areas of Strengths Employment, Education, Spirituality, Family, Housing Challenges Substance Use, Mental Health, Physical Health (Deferred) Family
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
## 2024-11-06 NOTE — BH OP Treatment Plan (Signed)
 Domestic Violence: Patient and/or identification tool has not identified the presence of domestic violence at this time.Alcohol  Use Disorders Identification Test (AUDIT)  Patient present and available to complete screening(s). and Completed during phone screen  09/07/2024   4:00 PM Audit-C Dates Total Score 10 Brief Addiction Monitor [facility])  Patient present and available to complete screening(s).  11/04/2024 Brief Addiction Monitor (BAM) Method of Administration Clinician Interview 1. In the past 30 days, how would you say your physical health has been? Good 2. In the past 30 days, how many nights did you have trouble falling asleep or staying asleep? 1-3 3. In the past 30 days, how many days have you felt depressed, anxious, angry or very upset throughout most of the day? 1-3 4. In the past 30 days, how many days did you drink ANY alcohol ? 0 (Skip to #6) 5. In the past 30 days, how many days did you have at least 5 drinks (if you are a man) or at least 4 drinks (if you are a woman)? 0 6. In the past 30 days, how many days did you use any illegal/street drugs or abuse any prescription medications? 0 (Skip to #8) 7A. Marijuana (cannabis, pot, weed)? 0 7B. Sedatives/Tranquilizers (e.g., benzos, Valium, Xanax, Ativan, Ambien, barbs, Phenobarbital, downers, etc.)? 0 7C. Cocaine/Crack? 0 7D. Other Stimulants (e.g., amphetamine, methamphetamine, Dexedrine, Ritalin, Adderall, speed, crystal meth, ice, etc.)? 0 7E. Opiates (e.g., Heroin, Morphine , Dilaudid , Demerol, Oxycontin , oxy, codeine (Tylenol  2,3,4), Percocet, Vicodin, Fentanyl , etc.)? 0 74F. Inhalants (glues/adhesives, nail polish remover, paint thinner, etc.)? 0 7G. Other drugs (steroids, non-prescription sleep/diet pills, Benadryl , Ephedra, other over- the-counter/unknown medications)? 0 8. In the past 30 days, how much were you bothered by cravings or urges to drink alcohol  or use drugs? Not at all  9. How confident are you in your ability to be completely abstinent (clean) from alcohol  and drugs in the next 30 days? Extremely 10. In the past 30 days, how many days did you attend self-help meetings like AA or NA to support your recovery? 0 11. In the past 30 days, how many days were you in any situations or with any people that might put you at an increased risk for using alcohol  or drugs? 1-3 12. Does your religion or spirituality help support your recovery? Extremely 13. In the past 30 days, how many days did you spend much of the time at work, school, or doing volunteer work? 16-30 14. Do you have enough income (from legal sources) to pay for necessities such as housing, transportation, food and clothing for yourself and your dependents? Yes 15. In the past 30 days, how much have you been bothered by arguments or problems getting along with any family members or friends? Not at all 16. In the past 30 days, how many days were you in contact or spent time with any family members or friends who are supportive of your recovery? 1-3 17. How satisfied are you with your progress toward achieving your recovery goals? Extremely CAGE  Not Clinically Relevant  04/06/2022  12:00 PM CAGE CAGE Total Score 1    Data saved with a previous flowsheet row definition Rapid Opioid Dependence Screen (RODS)  Not Clinically Relevant   No data to display    Drug Abuse Screen Test (DAST-10) Not Clinically Relevant   No data to display     [place] Suicide Severity Rating Scale (C-SSRS) Patient present and available to complete screening(s).[place] Suicide Severity Rating Scale Calculated C-SSRS Risk Score (Lifetime/Recent) 09/09/2024 No  Risk Indicated Generalized Anxiety Disorder Scale (GAD-7)  Will complete with Patient within the next 8 weeks  08/24/2024  10:00 AM GAD-7 Dates Total Score 21 Patient Health Questionnaire (ADULT PHQ9)   Patient present and available to complete screening(s).  09/09/2024  12:00 PM PHQ-9 PHQ Total Score 15 Patient Health Questionnaire (PEDS PHQ9)  Not Clinically Relevant   No data to display    Brief Obsessive Compulsive Scale-Self Report (BOCS-SR OCD)  Not Clinically Relevant  10/05/2024   8:39 AM BOCS-SR OCD SCORE ONLY Total # of checklist items endorsed 1 List of top three symptom numbers endorsed -- Sum of the five severity questions -- Total # of checklist items endorsed 1  Social Determinants of Health  Food Insecurity: No Food Insecurity (09/29/2024)  Hunger Vital Sign   Worried About Running Out of Food in the Last Year: Never true   Ran Out of Food in the Last Year: Never true  Date of last entry: 09/29/2024  Housing Stability: Low Risk (09/29/2024)  Housing Stability Vital Sign   Unable to Pay for Housing in the Last Year: No   Number of Times Moved in the Last Year: 1   Homeless in the Last Year: No  Date of last entry: 09/29/2024 Utilities: Not At Risk (09/29/2024)  [facility] Utilities   Threatened with loss of utilities: No  Date of last entry: 09/29/2024 Transportation Needs: No Transportation Needs (09/29/2024)  PRAPARE - Transportation   Lack of Transportation (Medical): No   Lack of Transportation (Non-Medical): No  Date of last entry: 09/29/2024     No data to display      09/29/2024   7:48 AM Interventions Declined Comment declines peer services and targeted case management

## 2024-11-06 NOTE — BH OP Treatment Plan (Signed)
 Peer Support Specialist Services:  Clinical Research Associate will discuss with Patient at next opportunity Psychiatric Rehabilitation Services:  offered and referred Targeted Case Management Services:  offered and declined by the patient Individual psychotherapy for 16-60 min Q 1-4 (or as needed) weeks with Franky Chyna Kneece, [facility], CAS[facility].Family therapy for 16-60 min Q 1-4 (or as needed) weeks with TBD.Mental Health Embedded Services at [facility] for 16-60 min Q 1-4 (or as needed) weeks with TBD.Buprenorphine  prescribed by Leone Salon, PA prescription frequency Q 1-4 (or as needed) weeks frequency of visits Q 1-4 (or as needed) weeksClinic Crisis Services as needed.Other: Toxicology as needed/requested, referrals as needed/requested Primary Care screening yearly as required. Assess and document mental health treatment needs and suicidal ideation, including the C-SSRS, and engage in safety planning to reduce risk of self-harm. Refer to additional services, higher level of care or emergency evaluation as clinically indicated.  MEDICATION SUPPORTED RECOVERY: Opioid Replacement Therapy: Buprenorphine   COLLABORATIVE CARE SESSION:N/A  DATE OF LAST PHYSICAL EXAM:? Not Applicable

## 2024-11-07 LAB — NORBUPRENORPHINE CREATININE RATIO, URINE (DRUGSCAN): Norbuprenorphine/Creatinine Ratio, Urine: 229 ng/mg

## 2024-11-07 LAB — BUPRENORPHINE & NALOXONE, URINE (DRUGSCAN)
Buprenorphine, Urine: 209.9 ng/mL — AB
Naloxone, Urine: 942.3 ng/mL — AB
Norbuprenorphine, Urine: 548.4 ng/mL — AB

## 2024-11-07 LAB — STRONG RECOVERY OUTPATIENT PANEL, URINE (DRUGSCAN)
Amphetamines, Urine: NEGATIVE ng/mL
Benzodiazepines, Urine: NEGATIVE ng/mL
Cocaine Metabolites, Urine: NEGATIVE ng/mL
Creatinine, Urine: 238.7 mg/dL (ref >20.00)
ETG/ETS, Urine: NEGATIVE ng/mL
Fentanyl, Urine: NEGATIVE ng/mL
Marijuana Metabolite, Urine: NEGATIVE ng/mL
Nitrite, Urine: 33.6 ug/mL (ref <200)
Opiates - Basic, Urine: NEGATIVE ng/mL
Oxycodone/Oxymorphone, Urine: NEGATIVE ng/mL
pH, Urine: 6 (ref 4.50–9.00)

## 2024-11-07 LAB — BUPRENORPHINE CREATININE RATIO,URINE(DRUGSCAN): Buprenorphine/Creatinine Ratio,Urine: 87 ng/mg

## 2024-11-11 ENCOUNTER — Telehealth: Payer: Self-pay

## 2024-11-11 NOTE — Telephone Encounter (Signed)
 Writer called [name] to schedule an initial mental health appointment with him. [name] answered the phone and agreed to be scheduled for Thursday 11/26/2024 at 10:00 AM. Routed to primary therapist, [name].

## 2024-11-11 NOTE — Progress Notes (Signed)
 Embedded Mental Health Triage Mtg12/30/2025Attendees: Beckey Sharps, [facility], CAS[facility]; MHC-LP; Leontine Bumpers, MS, MHC-LP.Patient Name: Adric Wrede MRN: [id number]  Patient Age: 38 y.o.[facility] Admission Date: 09/14/2024 Referral Date: 12/24/25Primary Clinician: [name], [facility], [facility] Program (ALD, OTP, AYA, SUD-PC): SUD-PC/IRTReason for referral in client's words: Toni states I'm struggling with anger, paranoia, depression and grief. Current mental health symptoms: Arman states low mood, grief and anger.  I feel paranoid, like someone is always trying to get me, like plotting against me.Are there any ACTIVE safety concerns? There are no active safety concerns. Current substance Use?  No, Halil has maintained abstinence since 09/13/24, this is supported by toxicology.Any current mental health treatment engagement? (identify where and enrollment status):  No, Miroslav was discharged from [facility]. Assigned Intake Therapist: Nguyen Dao

## 2024-11-13 DIAGNOSIS — F1120 Opioid dependence, uncomplicated: Secondary | ICD-10-CM

## 2024-11-13 NOTE — Progress Notes (Signed)
 [facility] Referral Note Clinician and program currently engaged in: Franky Peto discussed between clinician and Psychiatric Rehabilitation Services: [name] for referral: [name] is looking for assistance with developing a budget.Discussion completed with patient regarding referral: YesDIAGNOSIS:Diagnoses Code Name Primary?  F11.20 Opioid use disorder, severe, on agonist therapy Yes OTHER ACTIVE PARTICIPANTS IN PATIENT'S CARE AND SUPPORT SYSTEM (other than mental health providers): SUD Therapist: Franky Speck there a release of information present for any of the above providers? Yes, internal

## 2024-11-18 ENCOUNTER — Telehealth: Payer: Self-pay | Admitting: Mental Health

## 2024-11-18 ENCOUNTER — Other Ambulatory Visit: Payer: Self-pay

## 2024-11-18 ENCOUNTER — Ambulatory Visit: Payer: Self-pay | Attending: Psychology | Admitting: Mental Health

## 2024-11-18 NOTE — Telephone Encounter (Signed)
 Writer called Radio Broadcast Assistant after he did not arrive for his scheduled appointment on this date.  There was no answer.  Writer left a message explaining that he is calling to follow-up on missed appointment and encouraged a callback to discuss rescheduling.  Writer also provided a reminder for Mabry's upcoming appointment on 11/26/24 at 10:00 AM with Nguyen Dao for Embedded Mental Health.

## 2024-11-20 ENCOUNTER — Telehealth: Payer: Self-pay

## 2024-11-20 NOTE — Telephone Encounter (Signed)
 This clinical research associate spoke with [name] in order to schedule an appointment. [name] and this clinical research associate agreed to meet on 11/24/24 at 9:00am.

## 2024-11-24 ENCOUNTER — Telehealth: Payer: Self-pay

## 2024-11-24 ENCOUNTER — Ambulatory Visit: Attending: Psychology

## 2024-11-24 NOTE — Telephone Encounter (Signed)
 This clinical research associate spoke with [name] regarding his missed appointment. He apologized that he forgot about the appointment. He agreed to reschedule for 11/27/24 at 10:00am.

## 2024-11-26 ENCOUNTER — Encounter: Payer: Self-pay | Admitting: Gastroenterology

## 2024-11-26 ENCOUNTER — Telehealth: Payer: Self-pay

## 2024-11-26 ENCOUNTER — Other Ambulatory Visit: Payer: Self-pay

## 2024-11-26 ENCOUNTER — Telehealth: Payer: Self-pay | Admitting: Mental Health

## 2024-11-26 ENCOUNTER — Ambulatory Visit: Attending: Internal Medicine | Admitting: Nurse Practitioner

## 2024-11-26 ENCOUNTER — Ambulatory Visit: Payer: Self-pay

## 2024-11-26 ENCOUNTER — Encounter: Payer: Self-pay | Admitting: Nurse Practitioner

## 2024-11-26 ENCOUNTER — Other Ambulatory Visit
Admission: RE | Admit: 2024-11-26 | Discharge: 2024-11-26 | Disposition: A | Discharge status: Home / Self Care | Source: Ambulatory Visit | Attending: Nurse Practitioner | Admitting: Nurse Practitioner

## 2024-11-26 VITALS — BP 132/88 | HR 80 | Temp 97.9°F

## 2024-11-26 DIAGNOSIS — R142 Eructation: Secondary | ICD-10-CM

## 2024-11-26 DIAGNOSIS — R079 Chest pain, unspecified: Secondary | ICD-10-CM | POA: Insufficient documentation

## 2024-11-26 DIAGNOSIS — R609 Edema, unspecified: Secondary | ICD-10-CM

## 2024-11-26 LAB — CBC AND DIFFERENTIAL
Baso # K/uL: 0.1 THOU/uL (ref 0.0–0.2)
Eos # K/uL: 0.5 THOU/uL (ref 0.0–0.5)
Hematocrit: 38 % (ref 37–52)
Hemoglobin: 12.2 g/dL (ref 12.0–17.0)
IMM Granulocytes #: 0 THOU/uL (ref 0–0)
IMM Granulocytes: 0.1 %
Lymph # K/uL: 2.6 THOU/uL (ref 1.0–5.0)
MCV: 78 fL (ref 75–100)
Mono # K/uL: 0.6 THOU/uL (ref 0.1–1.0)
Neut # K/uL: 3.7 THOU/uL (ref 1.5–6.5)
Nucl RBC # K/uL: 0 THOU/uL
Nucl RBC %: 0 /100{WBCs} (ref 0.0–0.2)
Platelets: 298 THOU/uL (ref 150–450)
RBC: 4.8 MIL/uL (ref 4.0–6.0)
RDW: 13.7 % (ref 0.0–15.0)
Seg Neut %: 49.5 %
WBC: 7.6 THOU/uL (ref 3.5–11.0)

## 2024-11-26 LAB — COMPREHENSIVE METABOLIC PANEL
ALT: 125 U/L — ABNORMAL HIGH (ref 0–50)
AST: 65 U/L — ABNORMAL HIGH (ref 0–50)
Albumin: 4.6 g/dL (ref 3.5–5.2)
Alk Phos: 104 U/L (ref 40–130)
Anion Gap: 11 (ref 7–16)
Bilirubin,Total: 0.2 mg/dL (ref 0.0–1.2)
CO2: 27 mmol/L (ref 20–28)
Calcium: 10 mg/dL (ref 9.0–10.3)
Chloride: 105 mmol/L (ref 96–108)
Creatinine: 1.05 mg/dL (ref 0.67–1.17)
Glucose: 94 mg/dL (ref 60–99)
Lab: 9 mg/dL (ref 6–20)
Potassium: 4.5 mmol/L (ref 3.3–5.1)
Sodium: 143 mmol/L (ref 133–145)
Total Protein: 7.1 g/dL (ref 6.3–7.7)
eGFR BY CREAT: 92

## 2024-11-26 LAB — LIPID PANEL
Chol/HDL Ratio: 4.4
Cholesterol: 176 mg/dL
HDL: 40 mg/dL (ref 40–60)
LDL Calculated: 108 mg/dL
Non HDL Cholesterol: 136 mg/dL
Triglycerides: 157 mg/dL — AB

## 2024-11-26 LAB — TSH: TSH: 1.88 u[IU]/mL (ref 0.27–4.20)

## 2024-11-26 LAB — FOLATE: Folate: 6.9 ng/mL (ref >=4.6)

## 2024-11-26 LAB — T4, FREE: Free T4: 1 ng/dL (ref 0.9–1.7)

## 2024-11-26 LAB — NT-PRO BNP: NT-pro BNP: 52 pg/mL (ref 0–450)

## 2024-11-26 LAB — VITAMIN D: 25-OH Vit D Total: <6 ng/mL — ABNORMAL LOW (ref 30–60)

## 2024-11-26 LAB — IRON: Iron: 59 ug/dL (ref 45–170)

## 2024-11-26 LAB — VITAMIN B12: Vitamin B12: 403 pg/mL (ref 232–1245)

## 2024-11-26 LAB — HEMOGLOBIN A1C: Hemoglobin A1C: 5.8 % — ABNORMAL HIGH (ref <=5.6)

## 2024-11-26 LAB — MAGNESIUM: Magnesium: 2 mg/dL (ref 1.6–2.5)

## 2024-11-26 MED ORDER — SIMETHICONE 80 MG PO CHEW *I*: 80.0000 mg | CHEWABLE_TABLET | Freq: Four times a day (QID) | ORAL | 0 refills | Status: DC

## 2024-11-26 NOTE — Telephone Encounter (Signed)
 Writer called and spoke with [employer].  Writer explained that he is calling to check-in and to discuss scheduling.  [name] accepted an appointment for 10:00 AM on 12/01/24.Scientist, Research (medical) also shared that [name] attempted to contact him today due to missing appointment for [facility].  [name] shared that he did not realize that he had an appointment scheduled at [facility] today.  [name] shared that he will reschedule when he comes to the clinic tomorrow to meet with Psych Rehab therapist.
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
## 2024-11-26 NOTE — Telephone Encounter (Signed)
 Writer called Cord to follow-up on family services referral and [name] was scheduled for 1/20, 8:30 am, in person, 60 min.

## 2024-11-26 NOTE — Telephone Encounter (Signed)
 Writer called [name] to check in with him and reschedule our appointment because he missed our appointment this morning at 10:00 AM. [name] did not answer the phone. Writer could not leave him a voicemail due to his voicemail box being full.Note routed to primary therapist, [name].

## 2024-11-27 ENCOUNTER — Ambulatory Visit

## 2024-11-27 ENCOUNTER — Telehealth: Payer: Self-pay

## 2024-11-27 ENCOUNTER — Ambulatory Visit: Payer: Self-pay | Admitting: Nurse Practitioner

## 2024-11-27 ENCOUNTER — Other Ambulatory Visit: Payer: Self-pay | Admitting: Nurse Practitioner

## 2024-11-27 DIAGNOSIS — R7989 Other specified abnormal findings of blood chemistry: Secondary | ICD-10-CM

## 2024-11-27 DIAGNOSIS — R142 Eructation: Secondary | ICD-10-CM

## 2024-11-27 DIAGNOSIS — R079 Chest pain, unspecified: Secondary | ICD-10-CM

## 2024-11-27 LAB — H. PYLORI ANTIBODY, IGG: H Pylori IgG: POSITIVE — AB

## 2024-11-27 MED ORDER — ERGOCALCIFEROL 50000 UNIT PO CAPS *I*: 50000.0000 [IU] | ORAL_CAPSULE | ORAL | 0 refills | Status: DC

## 2024-11-27 NOTE — Telephone Encounter (Signed)
 Copied from CRM #[id number]. Topic: Appointments - Cancel/Reschedule>> Nov 27, 2024  9:29 AM Lorane B wrote: Patient,  is calling to cancel the patients  [facility] appointment which is currently scheduled on 1/16.Reason for the cancellation: Transportation issuesHas the appointment been cancelled? yesHas the appointment been rescheduled? noDate of new appointment? N/A Does the patient need a call back to reschedule?no

## 2024-12-01 ENCOUNTER — Telehealth: Payer: Self-pay | Admitting: Mental Health

## 2024-12-01 ENCOUNTER — Ambulatory Visit: Payer: Self-pay | Admitting: Mental Health

## 2024-12-01 ENCOUNTER — Telehealth: Payer: Self-pay

## 2024-12-01 ENCOUNTER — Ambulatory Visit

## 2024-12-01 NOTE — Telephone Encounter (Signed)
 Writer called Shia to check-in after he didn't show for his first appointment. Writer left a voice message asking patient to call back.

## 2024-12-01 NOTE — Telephone Encounter (Signed)
 Writer called and spoke with [employer].  Writer explained that he is calling to follow-up on missed appointment.  [name] apologized and explained I haven't been feeling well.  I have an infection in my stomach and my doctor said that my vitamin D  is low.  Writer provided active listening and support.  [name] shared that he is going to do his best to maintain his Provider appointment tomorrow morning.  [name] was agreeable to rescheduling his appointment with clinical research associate when he is at [facility] for his appointment tomorrow.

## 2024-12-02 ENCOUNTER — Telehealth: Payer: Self-pay | Admitting: Mental Health

## 2024-12-02 ENCOUNTER — Ambulatory Visit: Payer: Self-pay | Attending: Psychology

## 2024-12-02 ENCOUNTER — Other Ambulatory Visit: Payer: Self-pay

## 2024-12-02 ENCOUNTER — Telehealth: Payer: Self-pay

## 2024-12-02 DIAGNOSIS — F1020 Alcohol dependence, uncomplicated: Secondary | ICD-10-CM | POA: Insufficient documentation

## 2024-12-02 DIAGNOSIS — T402X5D Adverse effect of other opioids, subsequent encounter: Secondary | ICD-10-CM | POA: Insufficient documentation

## 2024-12-02 DIAGNOSIS — K5903 Drug induced constipation: Secondary | ICD-10-CM | POA: Insufficient documentation

## 2024-12-02 DIAGNOSIS — F17200 Nicotine dependence, unspecified, uncomplicated: Secondary | ICD-10-CM | POA: Insufficient documentation

## 2024-12-02 DIAGNOSIS — Z1389 Encounter for screening for other disorder: Secondary | ICD-10-CM | POA: Insufficient documentation

## 2024-12-02 DIAGNOSIS — F1420 Cocaine dependence, uncomplicated: Secondary | ICD-10-CM | POA: Insufficient documentation

## 2024-12-02 DIAGNOSIS — F1120 Opioid dependence, uncomplicated: Secondary | ICD-10-CM | POA: Insufficient documentation

## 2024-12-02 MED ORDER — BUPRENORPHINE HCL-NALOXONE HCL 4-1 MG SL FILM *I*
1.0000 | ORAL_FILM | Freq: Three times a day (TID) | SUBLINGUAL | 0 refills | Status: AC
  Filled 2024-12-02 – 2024-12-04 (×5): qty 84, 28d supply, fill #0

## 2024-12-02 NOTE — Telephone Encounter (Signed)
 Writer called [name] to check in with him due to missing our appointment last week. [name] answered the phone, stating he has been dealing with some family situations. [name] agreed to be scheduled for next Friday 12/11/2024 at 9:15 AM.Note routed to primary therapist, [name].

## 2024-12-02 NOTE — Progress Notes (Cosign Needed)
 Writer collaborated with [name]'s primary therapist, [name], on coordinating scheduling for him as [name] missed the initial mental health appointment with writer last week and has not been responsive to multiple outreach. Writer will reach out to [name] again later today to reschedule. Writer is in agreement with [name]'s plan of care for [name]'s engagement and vice versa.

## 2024-12-02 NOTE — Telephone Encounter (Signed)
 Writer attempted to contact [name] to follow-up on his Provider appointment, to discuss [facility] as a potential short term resource for housing and to discuss scheduling.  There was no answer and writer was unable to leave a message.
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
## 2024-12-03 ENCOUNTER — Other Ambulatory Visit: Payer: Self-pay

## 2024-12-04 ENCOUNTER — Telehealth: Payer: Self-pay

## 2024-12-04 ENCOUNTER — Other Ambulatory Visit: Payer: Self-pay

## 2024-12-04 DIAGNOSIS — F1120 Opioid dependence, uncomplicated: Secondary | ICD-10-CM
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
## 2024-12-04 NOTE — Telephone Encounter (Signed)
 Called patient per request of Del to set up appointment.  Appointment scheduled for 2/6 at 11am

## 2024-12-09 ENCOUNTER — Other Ambulatory Visit: Payer: Self-pay

## 2024-12-10 ENCOUNTER — Ambulatory Visit: Payer: Self-pay | Admitting: Internal Medicine

## 2024-12-10 VITALS — BP 131/83 | HR 83 | Temp 97.5°F | Ht 71.0 in

## 2024-12-10 DIAGNOSIS — R142 Eructation: Secondary | ICD-10-CM

## 2024-12-10 DIAGNOSIS — R7989 Other specified abnormal findings of blood chemistry: Secondary | ICD-10-CM

## 2024-12-10 DIAGNOSIS — R7303 Prediabetes: Secondary | ICD-10-CM

## 2024-12-10 DIAGNOSIS — R202 Paresthesia of skin: Secondary | ICD-10-CM

## 2024-12-10 DIAGNOSIS — R1013 Epigastric pain: Secondary | ICD-10-CM

## 2024-12-10 DIAGNOSIS — I10 Essential (primary) hypertension: Secondary | ICD-10-CM

## 2024-12-10 MED ORDER — ALBUTEROL SULFATE HFA 108 (90 BASE) MCG/ACT IN AERS *I*: 1.0000 | INHALATION_SPRAY | RESPIRATORY_TRACT | 5 refills | Status: DC | PRN

## 2024-12-10 MED ORDER — SIMETHICONE 80 MG PO CHEW *I*: 80.0000 mg | CHEWABLE_TABLET | Freq: Four times a day (QID) | ORAL | 0 refills | Status: DC

## 2024-12-10 MED ORDER — ERGOCALCIFEROL 50000 UNIT PO CAPS *I*: 50000.0000 [IU] | ORAL_CAPSULE | ORAL | 0 refills | Status: DC

## 2024-12-10 NOTE — Progress Notes (Signed)
 [facility] - Office VisitCHIEF COMPLAINT: Chief Complaint Patient presents with  Follow-up   Follow up for chest and stomach pain over the past month.  Last seen Jan 15 for chest pain, swelling of hands and feet.- CP suspected to be related to MSK or GERD.- Many labs ordered. Most notable for low vit D, mild elevation of AST/ALT.- A1c stable.He is following with [facility] for OUD; on suboxone .History of Present IllnessThe patient is a 39 year old male who presents for a follow-up of stomach pain, hand pain, shortness of breath, and elevated liver enzymes.Stomach discomfort:He reports persistent bloating, gas, and burping for the past few weeks. He has not made any recent dietary changes but consumes greasy and fatty foods. He was prescribed simethicone  for gas relief and inquires about its continued use.Paresthesias:He describes a sensation of pins and needles in his fingertips, making it difficult to close his hands. This symptom has been present for approximately a month. He also experiences similar symptoms in his feet, which cause discomfort during walking. Upon waking, he notices swelling in his hands and feet, and the numbness in his fingertips persists. He does not report any neck pain.He experiences shortness of breath, particularly when exiting his car and walking briskly to his workplace. He finds that burping sometimes alleviates his breathing difficulties. His albuterol  inhaler provides intermittent relief.OBJECTIVEBP 131/83 (BP Location: Right arm, Patient Position: Sitting, Cuff Size: large adult)   Pulse 83   Temp 36.4 C (97.5 F) (Infrared)   Ht 1.803 m (5' 11) Comment: Transcribed  SpO2 97%   BMI 30.96 kg/m BP Readings from Last 3 Encounters: 12/10/24 131/83 11/26/24 132/88 10/06/24 116/72 Vitals: reviewed.Gen: alert and appropriately conversant.Finger tips: sensation intact to monofilament on fingers  bilaterallyAssessment & PlanEssential hypertensionBP reasonably well-controlled. Continue amlodipine  5mg  daily. PrediabetesA1c remains stable in prediabetic range. He's been prescribed metformin  for years. Low vitamin D  levelPlan to re-check vitamin D  level after taking supplementation for a couple months.Orders:  Vitamin D ; Future  ergocalciferol  50,000 unit capsule; Take 1 capsule (50,000 units total) by mouth once a week. For 8 weeksElevated LFTsMild elevation. Etiology not clear. Repeat LFTs in a couple months.Orders:  Hepatic function panel; FutureEpigastric painDietary habits may be contributing (a lot of greasy/fatty food). Advised to adjust diet.Check h pylori stool antigen once he's stopped omeprazole  x 2 weeks. Given supplies for stool sample.Orders:  H. pylori antigen, stool; Future  simethicone  (MYLICON) 80 MG chewable tablet; Place 1 tablet (80 mg total) into mouth, chew and swallow 4 times daily (with meals and nightly) for Gas.Paresthesia of both handsUnclear etiology. He doesn't have neck pain, which doesn't eliminate the possibility of a pinched nerve but may make it a little less likely. Can check screening autoimmune labs, as below, with next blood draw. As far as I know, low vit D shouldn't cause this symptom, but his vit D level was very low, so it will be interesting to see if this symptom resolves with vit D supplementation.Orders:  Cyclic citrullinated peptide; Future  Antinuclear antibody screen; FutureBurpingSee above re: epigastric pain.OK to use simethicone . Follow up in about 3 months (around 03/10/2025) for multiple issues.Evalene Care, MD MPH

## 2024-12-10 NOTE — Assessment & Plan Note (Addendum)
 A1c remains stable in prediabetic range. He's been prescribed metformin  for years.

## 2024-12-10 NOTE — Assessment & Plan Note (Addendum)
 BP reasonably well-controlled. Continue amlodipine  5mg  daily.

## 2024-12-11 ENCOUNTER — Ambulatory Visit: Payer: Self-pay

## 2024-12-11 ENCOUNTER — Telehealth: Payer: Self-pay

## 2024-12-11 ENCOUNTER — Other Ambulatory Visit: Payer: Self-pay | Admitting: Internal Medicine

## 2024-12-11 DIAGNOSIS — R1013 Epigastric pain: Secondary | ICD-10-CM

## 2024-12-11 DIAGNOSIS — R7989 Other specified abnormal findings of blood chemistry: Secondary | ICD-10-CM

## 2024-12-11 MED ORDER — SIMETHICONE 80 MG PO CHEW *I*: 80.0000 mg | CHEWABLE_TABLET | Freq: Four times a day (QID) | ORAL | 0 refills | Status: AC

## 2024-12-11 MED ORDER — ERGOCALCIFEROL 50000 UNIT PO CAPS *I*: 50000.0000 [IU] | ORAL_CAPSULE | ORAL | 0 refills | Status: AC

## 2024-12-11 MED ORDER — ALBUTEROL SULFATE HFA 108 (90 BASE) MCG/ACT IN AERS *I*: 1.0000 | INHALATION_SPRAY | RESPIRATORY_TRACT | 5 refills | Status: AC | PRN

## 2024-12-11 NOTE — Telephone Encounter (Signed)
 Writer called Arkeem to check in with him due to his missing our appointment today at 9:15 AM. Barclay answered the phone, stating he was having some stomach pain and not feeling well. Writer encouraged Mohd to reach out to his primary therapist, [name], for additional help and for coordinating his scheduling when he feels physically better. Rosa expressed understanding.Note routed to primary therapist, [name].

## 2024-12-18 ENCOUNTER — Ambulatory Visit: Attending: Psychology

## 2025-03-04 ENCOUNTER — Ambulatory Visit: Payer: Self-pay | Admitting: Internal Medicine
